# Patient Record
Sex: Female | Born: 1935 | Hispanic: Yes | Smoking: Never smoker
Health system: Southern US, Community
[De-identification: ages and names within clinical notes are randomized; demographics above are authoritative.]

## PROBLEM LIST (undated history)

## (undated) DIAGNOSIS — M069 Rheumatoid arthritis, unspecified: Secondary | ICD-10-CM

## (undated) DIAGNOSIS — IMO0001 Reserved for inherently not codable concepts without codable children: Secondary | ICD-10-CM

## (undated) DIAGNOSIS — Z531 Procedure and treatment not carried out because of patient's decision for reasons of belief and group pressure: Secondary | ICD-10-CM

## (undated) DIAGNOSIS — E7800 Pure hypercholesterolemia, unspecified: Secondary | ICD-10-CM

## (undated) DIAGNOSIS — J189 Pneumonia, unspecified organism: Secondary | ICD-10-CM

## (undated) DIAGNOSIS — E079 Disorder of thyroid, unspecified: Secondary | ICD-10-CM

## (undated) DIAGNOSIS — I10 Essential (primary) hypertension: Secondary | ICD-10-CM

## (undated) DIAGNOSIS — E039 Hypothyroidism, unspecified: Secondary | ICD-10-CM

## (undated) HISTORY — PX: CATARACT EXTRACTION W/ INTRAOCULAR LENS IMPLANT: SHX1309

## (undated) HISTORY — PX: DILATION AND CURETTAGE OF UTERUS: SHX78

## (undated) HISTORY — PX: TUBAL LIGATION: SHX77

---

## 2016-05-31 ENCOUNTER — Emergency Department (HOSPITAL_COMMUNITY): Payer: Medicare (Managed Care)

## 2016-05-31 ENCOUNTER — Encounter (HOSPITAL_COMMUNITY): Payer: Self-pay

## 2016-05-31 ENCOUNTER — Ambulatory Visit (HOSPITAL_COMMUNITY): Payer: Medicare (Managed Care)

## 2016-05-31 ENCOUNTER — Other Ambulatory Visit (HOSPITAL_COMMUNITY): Payer: Medicare (Managed Care)

## 2016-05-31 ENCOUNTER — Inpatient Hospital Stay (HOSPITAL_COMMUNITY)
Admission: EM | Admit: 2016-05-31 | Discharge: 2016-06-04 | DRG: 287 | Disposition: A | Discharge status: Home / Self Care | Payer: Medicare (Managed Care) | Attending: Internal Medicine | Admitting: Internal Medicine

## 2016-05-31 DIAGNOSIS — M069 Rheumatoid arthritis, unspecified: Secondary | ICD-10-CM | POA: Diagnosis present

## 2016-05-31 DIAGNOSIS — I272 Other secondary pulmonary hypertension: Secondary | ICD-10-CM | POA: Diagnosis present

## 2016-05-31 DIAGNOSIS — I700 Atherosclerosis of aorta: Secondary | ICD-10-CM | POA: Diagnosis present

## 2016-05-31 DIAGNOSIS — R710 Precipitous drop in hematocrit: Secondary | ICD-10-CM

## 2016-05-31 DIAGNOSIS — I480 Paroxysmal atrial fibrillation: Secondary | ICD-10-CM | POA: Diagnosis not present

## 2016-05-31 DIAGNOSIS — E063 Autoimmune thyroiditis: Secondary | ICD-10-CM | POA: Diagnosis present

## 2016-05-31 DIAGNOSIS — Z79899 Other long term (current) drug therapy: Secondary | ICD-10-CM

## 2016-05-31 DIAGNOSIS — I2584 Coronary atherosclerosis due to calcified coronary lesion: Secondary | ICD-10-CM

## 2016-05-31 DIAGNOSIS — I2510 Atherosclerotic heart disease of native coronary artery without angina pectoris: Secondary | ICD-10-CM | POA: Diagnosis present

## 2016-05-31 DIAGNOSIS — I9589 Other hypotension: Secondary | ICD-10-CM | POA: Diagnosis not present

## 2016-05-31 DIAGNOSIS — Z993 Dependence on wheelchair: Secondary | ICD-10-CM

## 2016-05-31 DIAGNOSIS — I10 Essential (primary) hypertension: Secondary | ICD-10-CM | POA: Diagnosis present

## 2016-05-31 DIAGNOSIS — N141 Nephropathy induced by other drugs, medicaments and biological substances: Secondary | ICD-10-CM | POA: Diagnosis not present

## 2016-05-31 DIAGNOSIS — K219 Gastro-esophageal reflux disease without esophagitis: Secondary | ICD-10-CM | POA: Diagnosis present

## 2016-05-31 DIAGNOSIS — R079 Chest pain, unspecified: Secondary | ICD-10-CM | POA: Diagnosis present

## 2016-05-31 DIAGNOSIS — I712 Thoracic aortic aneurysm, without rupture: Secondary | ICD-10-CM | POA: Diagnosis present

## 2016-05-31 DIAGNOSIS — M810 Age-related osteoporosis without current pathological fracture: Secondary | ICD-10-CM | POA: Diagnosis present

## 2016-05-31 DIAGNOSIS — T508X5A Adverse effect of diagnostic agents, initial encounter: Secondary | ICD-10-CM | POA: Diagnosis not present

## 2016-05-31 DIAGNOSIS — I7121 Aneurysm of the ascending aorta, without rupture: Secondary | ICD-10-CM

## 2016-05-31 DIAGNOSIS — N390 Urinary tract infection, site not specified: Secondary | ICD-10-CM | POA: Diagnosis not present

## 2016-05-31 DIAGNOSIS — I959 Hypotension, unspecified: Secondary | ICD-10-CM | POA: Diagnosis present

## 2016-05-31 DIAGNOSIS — D649 Anemia, unspecified: Secondary | ICD-10-CM | POA: Diagnosis present

## 2016-05-31 HISTORY — DX: Rheumatoid arthritis, unspecified: M06.9

## 2016-05-31 HISTORY — DX: Hypothyroidism, unspecified: E03.9

## 2016-05-31 HISTORY — DX: Pneumonia, unspecified organism: J18.9

## 2016-05-31 HISTORY — DX: Disorder of thyroid, unspecified: E07.9

## 2016-05-31 HISTORY — DX: Essential (primary) hypertension: I10

## 2016-05-31 LAB — CBC
HEMATOCRIT: 41.4 % (ref 36.0–46.0)
Hemoglobin: 13 g/dL (ref 12.0–15.0)
MCH: 26.4 pg (ref 26.0–34.0)
MCHC: 31.4 g/dL (ref 30.0–36.0)
MCV: 84.1 fL (ref 78.0–100.0)
Platelets: 281 10*3/uL (ref 150–400)
RBC: 4.92 MIL/uL (ref 3.87–5.11)
RDW: 15.8 % — AB (ref 11.5–15.5)
WBC: 14 10*3/uL — AB (ref 4.0–10.5)

## 2016-05-31 LAB — BASIC METABOLIC PANEL
Anion gap: 6 (ref 5–15)
BUN: 12 mg/dL (ref 6–20)
CHLORIDE: 105 mmol/L (ref 101–111)
CO2: 26 mmol/L (ref 22–32)
Calcium: 9.1 mg/dL (ref 8.9–10.3)
Creatinine, Ser: 0.64 mg/dL (ref 0.44–1.00)
GFR calc Af Amer: >60 mL/min (ref >60)
GFR calc non Af Amer: >60 mL/min (ref >60)
Glucose, Bld: 110 mg/dL — ABNORMAL HIGH (ref 65–99)
POTASSIUM: 3.8 mmol/L (ref 3.5–5.1)
SODIUM: 137 mmol/L (ref 135–145)

## 2016-05-31 LAB — D-DIMER, QUANTITATIVE (NOT AT ARMC): D DIMER QUANT: 1.31 ug{FEU}/mL — AB (ref 0.00–0.50)

## 2016-05-31 LAB — TROPONIN I
TROPONIN I: 0.04 ng/mL — AB (ref <0.03)
Troponin I: 0.03 ng/mL (ref <0.03)

## 2016-05-31 LAB — I-STAT TROPONIN, ED: Troponin i, poc: 0.05 ng/mL (ref 0.00–0.08)

## 2016-05-31 MED ORDER — ONDANSETRON HCL 4 MG/2ML IJ SOLN
4.0000 mg | Freq: Once | INTRAMUSCULAR | Status: AC
  Administered 2016-05-31: 4 mg via INTRAVENOUS
  Filled 2016-05-31: qty 2

## 2016-05-31 MED ORDER — SODIUM CHLORIDE 0.9% FLUSH
3.0000 mL | Freq: Two times a day (BID) | INTRAVENOUS | Status: DC
  Administered 2016-06-01 – 2016-06-03 (×4): 3 mL via INTRAVENOUS

## 2016-05-31 MED ORDER — KETOROLAC TROMETHAMINE 30 MG/ML IJ SOLN
15.0000 mg | Freq: Three times a day (TID) | INTRAMUSCULAR | Status: AC | PRN
  Administered 2016-05-31 – 2016-06-01 (×2): 15 mg via INTRAVENOUS
  Filled 2016-05-31 (×2): qty 1

## 2016-05-31 MED ORDER — HEPARIN (PORCINE) IN NACL 100-0.45 UNIT/ML-% IJ SOLN
1300.0000 [IU]/h | INTRAMUSCULAR | Status: DC
  Administered 2016-05-31: 700 [IU]/h via INTRAVENOUS
  Filled 2016-05-31 (×2): qty 250

## 2016-05-31 MED ORDER — ETODOLAC 200 MG PO CAPS
200.0000 mg | ORAL_CAPSULE | Freq: Three times a day (TID) | ORAL | Status: DC
  Administered 2016-05-31 – 2016-06-04 (×9): 200 mg via ORAL
  Filled 2016-05-31 (×15): qty 1

## 2016-05-31 MED ORDER — SODIUM CHLORIDE 0.9 % IV BOLUS (SEPSIS)
250.0000 mL | Freq: Once | INTRAVENOUS | Status: AC
  Administered 2016-05-31: 250 mL via INTRAVENOUS

## 2016-05-31 MED ORDER — TRAMADOL HCL 50 MG PO TABS
50.0000 mg | ORAL_TABLET | Freq: Three times a day (TID) | ORAL | Status: DC
  Administered 2016-05-31 – 2016-06-04 (×12): 50 mg via ORAL
  Filled 2016-05-31 (×12): qty 1

## 2016-05-31 MED ORDER — LEVOTHYROXINE SODIUM 112 MCG PO TABS
112.0000 ug | ORAL_TABLET | Freq: Every day | ORAL | Status: DC
  Administered 2016-06-01: 112 ug via ORAL
  Filled 2016-05-31: qty 1

## 2016-05-31 MED ORDER — CLONAZEPAM 0.5 MG PO TABS
0.5000 mg | ORAL_TABLET | Freq: Two times a day (BID) | ORAL | Status: DC | PRN
  Administered 2016-05-31: 0.5 mg via ORAL
  Filled 2016-05-31: qty 1

## 2016-05-31 MED ORDER — GI COCKTAIL ~~LOC~~
30.0000 mL | Freq: Once | ORAL | Status: AC
  Administered 2016-05-31: 30 mL via ORAL
  Filled 2016-05-31: qty 30

## 2016-05-31 MED ORDER — FAMOTIDINE 20 MG PO TABS
20.0000 mg | ORAL_TABLET | Freq: Every day | ORAL | Status: DC
Start: 2016-05-31 — End: 2016-06-04
  Administered 2016-05-31 – 2016-06-04 (×5): 20 mg via ORAL
  Filled 2016-05-31 (×5): qty 1

## 2016-05-31 MED ORDER — HEPARIN BOLUS VIA INFUSION
3100.0000 [IU] | Freq: Once | INTRAVENOUS | Status: AC
  Administered 2016-05-31: 3100 [IU] via INTRAVENOUS
  Filled 2016-05-31: qty 3100

## 2016-05-31 MED ORDER — IOPAMIDOL (ISOVUE-370) INJECTION 76%
80.0000 mL | Freq: Once | INTRAVENOUS | Status: AC | PRN
  Administered 2016-05-31: 80 mL via INTRAVENOUS

## 2016-05-31 MED ORDER — CALCIUM CARBONATE 1250 (500 CA) MG PO TABS
1.0000 | ORAL_TABLET | Freq: Every day | ORAL | Status: DC
  Administered 2016-06-03 – 2016-06-04 (×2): 500 mg via ORAL
  Filled 2016-05-31 (×3): qty 1

## 2016-05-31 MED ORDER — MORPHINE SULFATE (PF) 4 MG/ML IV SOLN
4.0000 mg | Freq: Once | INTRAVENOUS | Status: AC
Start: 2016-05-31 — End: 2016-05-31
  Administered 2016-05-31: 2 mg via INTRAVENOUS
  Filled 2016-05-31: qty 1

## 2016-05-31 MED ORDER — ENSURE ENLIVE PO LIQD
237.0000 mL | Freq: Two times a day (BID) | ORAL | Status: DC
  Administered 2016-06-01 – 2016-06-04 (×4): 237 mL via ORAL

## 2016-05-31 MED ORDER — NITROGLYCERIN 0.4 MG SL SUBL
0.4000 mg | SUBLINGUAL_TABLET | SUBLINGUAL | Status: DC | PRN
  Administered 2016-05-31 (×4): 0.4 mg via SUBLINGUAL
  Filled 2016-05-31 (×3): qty 1

## 2016-05-31 MED ORDER — ETODOLAC ER 600 MG PO TB24: 300.0000 mg | ORAL_TABLET | Freq: Three times a day (TID) | ORAL | Status: DC

## 2016-05-31 MED ORDER — ASPIRIN 81 MG PO CHEW
324.0000 mg | CHEWABLE_TABLET | Freq: Once | ORAL | Status: AC
  Administered 2016-05-31: 324 mg via ORAL
  Filled 2016-05-31: qty 4

## 2016-05-31 MED ORDER — VITAMIN D 1000 UNITS PO TABS
1000.0000 [IU] | ORAL_TABLET | Freq: Every day | ORAL | Status: DC
  Administered 2016-06-01 – 2016-06-04 (×4): 1000 [IU] via ORAL
  Filled 2016-05-31 (×4): qty 1

## 2016-05-31 NOTE — ED Provider Notes (Signed)
CSN: [id number]     Arrival date & time 05/31/16  2595 History   First MD Initiated Contact with Patient 05/31/16 0945     Chief Complaint  Patient presents with  . Chest Pain  . Shortness of Breath     (Consider location/radiation/quality/duration/timing/severity/associated sxs/prior Treatment) HPI Comments: Patient with a history of HTN, Hypothyroidism, and RA presents today with a chief complaint of chest pain.  She states that she has had pain across her chest since last evening.  Pain radiates to her back.  She reports that the pain is worse with inspiration.  She has taken Tramadol 50 mg for the pain without improvement.  She was not doing anything exertional at the onset of pain.  Pain associated with SOB.  She denies cough, hemoptysis, abdominal pain, nausea, vomiting, numbness, tingling, or LE edema.  Denies prior cardiac history.  She denies any history of DM, Hyperlipidemia, or smoking.  Patient denies history of DVT or PE.  She traveled here from [place] via plane on 05-03-16.  No surgeries in the past 4 weeks.  She is wheelchair bound due to the severity of the RA.  The history is provided by the patient.    Past Medical History  Diagnosis Date  . Hypertension   . Arthritis   . Thyroid disease    History reviewed. No pertinent past surgical history. No family history on file. Social History  Substance Use Topics  . Smoking status: Never Smoker   . Smokeless tobacco: None  . Alcohol Use: None   OB History    No data available     Review of Systems  All other systems reviewed and are negative.     Allergies  Review of patient's allergies indicates no known allergies.  Home Medications   Prior to Admission medications   Not on File   BP 120/82 mmHg  Pulse 96  Temp(Src) 98.3 F (36.8 C) (Oral)  Resp 27  Ht 5\' 3"  (1.6 m)  Wt 52.164 kg  BMI 20.38 kg/m2  SpO2 93% Physical Exam  Constitutional: She appears well-developed and well-nourished.  HENT:   Head: Normocephalic and atraumatic.  Neck: Normal range of motion. Neck supple.  Cardiovascular: Normal rate, regular rhythm and normal heart sounds.   Pulmonary/Chest: Effort normal and breath sounds normal. Tachypnea noted. No respiratory distress. She has no wheezes. She has no rales. She exhibits tenderness.  Abdominal: Soft. She exhibits no mass. There is no tenderness.  Musculoskeletal: Normal range of motion.  No LE edema or erythema  Neurological: She is alert.  Skin: Skin is warm and dry.  Psychiatric: She has a normal mood and affect.  Nursing note and vitals reviewed.   ED Course  Procedures (including critical care time) Labs Review Labs Reviewed  BASIC METABOLIC PANEL  CBC  D-DIMER, QUANTITATIVE (NOT AT [facility])  OTTO [facility], ED    Imaging Review Dg Chest 2 View  05/31/2016  CLINICAL DATA:  Severe shortness of breath and chest pain since yesterday. EXAM: CHEST  2 VIEW COMPARISON:  None. FINDINGS: Linear densities at left lung base could represent atelectasis or scarring. The thoracic aorta appears to be large for size and has atherosclerotic calcifications. Distal descending thoracic aorta appears to measure at least 4 cm. The ascending thoracic aorta also appears prominent on the frontal view. Sclerosis and degenerative changes involving the left glenohumeral joint. No large pleural effusions. Evidence for a compression deformity along the superior endplate of T10. Age of this fracture  is unknown. IMPRESSION: Enlargement of the thoracic aorta and concern for a thoracic aortic aneurysm. This could better characterized with CT examination. Aortic atherosclerosis. Linear densities at the left lung base may represent scarring or atelectasis, particularly in the lingula. This could also be better characterized with CT. T10 compression fracture.  Fracture is age indeterminate. Electronically Signed   By: Richarda Overlie M.D.   On: 05/31/2016 10:41   Ct Angio Chest Pe W Or Wo  Contrast  05/31/2016  CLINICAL DATA:  Chest pain and shortness of breath for 1 day EXAM: CT ANGIOGRAPHY CHEST WITH CONTRAST TECHNIQUE: Multidetector CT imaging of the chest was performed using the standard protocol during bolus administration of intravenous contrast. Multiplanar CT image reconstructions and MIPs were obtained to evaluate the vascular anatomy. CONTRAST:  80 mL Isovue 370 nonionic COMPARISON:  Chest radiograph May 31, 2016 FINDINGS: Cardiovascular: There is no demonstrable pulmonary embolus. Ascending thoracic aorta has a maximum transverse diameter of 4.6 x 4.6 cm. The entire thoracic aorta appears somewhat ectatic. Contrast bolus is not timed to optimize aortic visualization. No dissection is seen in the thoracic aorta to the extent that assessment for potential dissection can be made based on the contrast bolus. There is mild calcification in the proximal left common carotid and left subclavian arteries. Visualized great vessels otherwise appear unremarkable. There is no appreciable pericardial thickening. There is slight generalized cardiac enlargement. There are scattered foci of coronary artery calcification. Mediastinum/Nodes: Thyroid appears unremarkable. There is no appreciable thoracic adenopathy. Lungs/Pleura: There is patchy atelectatic change in both lung bases as well as in the posterior segments of each upper lobe. There is peribronchial thickening in the lateral segment of the right middle lobe. There is also central peribronchial thickening in both lower lobes proximally. Upper Abdomen: In the visualized upper abdomen, there is atherosclerotic calcification in the upper abdominal aorta. Visualized upper abdominal structures otherwise are unremarkable. Musculoskeletal: There is anterior wedging of the T11 vertebral body. There are no blastic or lytic bone lesions. Review of the MIP images confirms the above findings. IMPRESSION: No demonstrable pulmonary embolus. Ascending thoracic  aorta has a maximum transverse diameter 4.6 x 4.6 cm. Ascending thoracic aortic aneurysm. Recommend semi-annual imaging followup by CTA or MRA and referral to cardiothoracic surgery if not already obtained. This recommendation follows 2010 ACCF/AHA/AATS/ACR/ASA/SCA/SCAI/SIR/STS/SVM Guidelines for the Diagnosis and Management of Patients With Thoracic Aortic Disease. Circulation. 2010; 121: Z993-T701. There are atherosclerotic foci of calcification in the aorta. There are scattered foci of coronary artery calcification. Pericardium is not appreciably thickened. Areas of bronchitis and atelectasis.  No frank consolidation. No apparent adenopathy. Electronically Signed   By: Bretta Bang III M.D.   On: 05/31/2016 11:44   Nm Myocar Multi W/spect W/wall Motion / Ef  06/01/2016  CLINICAL DATA:  82 year old female with chest pain and hypertension band. Note ST changes on EKG. Minimal T-wave inversion. EXAM: MYOCARDIAL IMAGING WITH SPECT (REST AND PHARMACOLOGIC-STRESS) GATED LEFT VENTRICULAR WALL MOTION STUDY LEFT VENTRICULAR EJECTION FRACTION TECHNIQUE: Standard myocardial SPECT imaging was performed after resting intravenous injection of 10 mCi Tc-36m tetrofosmin. Subsequently, intravenous infusion of Lexiscan was performed under the supervision of the Cardiology staff. At peak effect of the drug, 30 mCi Tc-62m tetrofosmin was injected intravenously and standard myocardial SPECT imaging was performed. Quantitative gated imaging was also performed to evaluate left ventricular wall motion, and estimate left ventricular ejection fraction. COMPARISON:  None. FINDINGS: Perfusion: There is a small to moderate size region of mild decrease counts in the  mid and basilar segment of lateral wall which improves from stress to rest. Wall Motion: Normal left ventricular wall motion. No left ventricular dilation. Left Ventricular Ejection Fraction: 71 % End diastolic volume 47 ml End systolic volume 14 ml IMPRESSION: 1. Small is  moderate region of reversible perfusion in the mid and basilar segment of lateral wall. 2. Normal left ventricular wall motion. 3. Left ventricular ejection fraction 71% 4. Non invasive risk stratification*: Intermediate *2012 Appropriate Use Criteria for Coronary Revascularization Focused Update: J Am Coll Cardiol. 2012;59(9):857-881. http://content.dementiazones.com.aspx?articleid=[email or web address] These results will be called to the ordering clinician or representative by the Radiologist Assistant, and communication documented in the PACS or zVision Dashboard. Electronically Signed   By: Genevive Bi M.D.   On: 06/01/2016 14:04   I have personally reviewed and evaluated these images and lab results as part of my medical decision-making.   EKG Interpretation   Date/Time:  Wednesday May 31 2016 09:44:32 EDT Ventricular Rate:  109 PR Interval:  148 QRS Duration: 66 QT Interval:  316 QTC Calculation: 425 R Axis:   29 Text Interpretation:  Sinus tachycardia Septal infarct , age undetermined  Abnormal ECG No previous ECGs available Confirmed by Manus Gunning  MD, STEPHEN  [phone]) on 05/31/2016 9:56:24 AM Also confirmed by Manus Gunning  MD, STEPHEN  [phone]), editor BREWER, CCT, GLENN ([id number])  on 05/31/2016 10:01:51 AM      12:15 PM Discussed the thoracic aortic aneurysm with CT surgery.  He states that this would need to be monitored, but would not need surgery at this time.  He does not feel that any additional imaging is indicated at this time if the patient appears stable.    1:44 PM Discussed with Trish with Cardiology.  She states that they will come see the patient.   MDM   Final diagnoses:  None   Patient with a history of HTN and RA presents today with chest pain that has been constant since last evening.  Pain worse with inspiration.  She is wheelchair bound and flew from [place] on 05-03-16.  Therefore, she does have risk factors for PE.  She did have a widened mediastinum on CXR.  However,  clinically her presentation is most consistent with PE.  Blood pressure is stable.  Patient appears to be only mildly uncomfortable on exam.  Therefore, doubt dissection.  Concern for PE.  Therefore, CT angio chest ordered to rule out PE.  CT angio chest negative for PE.  EKG unremarkable.  Initial troponin is slightly positive at 0.04.  Cardiology consulted and recommended admission to medicine and will perform nuclear stress test in the morning.  Patient admitted to Internal Medicine teaching service.     Santiago Glad, PA-C 06/01/16 2211  Glynn Octave, MD 06/06/16 [phone]

## 2016-05-31 NOTE — ED Notes (Signed)
Patient here with SSCP radiating to back and neck since last pm, describes pain as sharp. Pain with inspiration. Family states that she has had lots of reflux past few days.

## 2016-05-31 NOTE — Consult Note (Signed)
CARDIOLOGY CONSULT NOTE   Patient ID: Whitney Mayer MRN: [id number] DOB/AGE: 80-May-1933 80 y.o.  Admit date: 05/31/2016  Primary Physician   No primary care provider on file. Primary Cardiologist: New Requesting MD: Dr. Jaci Lazier Reason for Consultation: Chest pain  HPI: Whitney Mayer is a 81 year old female with a past medical history of HTN, RA and hypothyroidism. No prior history of CAD.   She is visiting her daughters, patient lives full time in [place] She was eating breakfast yesterday morning, and after she finished her breakfast she developed substernal chest pain that she describes as sharp pressure with radiation to her back. She denies shortness of breath but feels like that her breathing is more shallow. Denies associated diaphoresis, nausea, and vomiting. Her symptoms continued until around lunch time at which time she says that she just "didn't feel well". She also says that she felt like that she had some indigestion. Her pain at that time began to radiate to her throat area. She continued to have chest pain all day yesterday and all night last night. She decided to come to the emergency department as her pain was not relieved. She was given morphine IV in the emergency department with no relief of her pain.  She suffers from rheumatoid arthritis, she no longer can walk, she gets around in a wheelchair. It has been 7 months since she last walked. She follows with a primary care provider in [place] and reports that she takes losartan for high blood pressure. She takes her medication with high compliance. Denies a family history of coronary artery disease. Her father died when she was only 17 years old, of unknown cause. Her mother lived to be 58 years old, she did not have a history of CAD but did have a stroke near the end of her life.   Her EKG shows sinus tachycardia. Her troponin is 0.04. Her d-dimer was elevated, CT angiogram ruled out pulmonary embolus. There was  scattered foci of coronary artery calcification on CT, she also had a 4.6 cm ascending thoracic aortic aneurysm.    Past Medical History  Diagnosis Date  . Hypertension   . Arthritis   . Thyroid disease      History reviewed. No pertinent past surgical history.  No Known Allergies  I have reviewed the patient's current medications . gi cocktail  30 mL Oral Once       Prior to Admission medications   Medication Sig Start Date End Date Taking? Authorizing Provider  aspirin 325 MG tablet Take 325 mg by mouth daily as needed for mild pain.   Yes Historical Provider, MD  clonazePAM (KLONOPIN) 0.5 MG tablet Take 0.5 mg by mouth 2 (two) times daily as needed for anxiety.   Yes Historical Provider, MD  etodolac (LODINE XL) 600 MG 24 hr tablet Take 300 mg by mouth 3 (three) times daily.   Yes Historical Provider, MD  ibuprofen (ADVIL,MOTRIN) 200 MG tablet Take 400 mg by mouth every 6 (six) hours as needed for moderate pain.   Yes Historical Provider, MD  levothyroxine (SYNTHROID, LEVOTHROID) 112 MCG tablet Take 112 mcg by mouth daily.   Yes Historical Provider, MD  losartan (COZAAR) 100 MG tablet Take 100 mg by mouth daily.   Yes Historical Provider, MD  traMADol (ULTRAM) 50 MG tablet Take 50 mg by mouth 3 (three) times daily.   Yes Historical Provider, MD     Social History   Social History  .  Marital Status: Divorced    Spouse Name: N/A  . Number of Children: N/A  . Years of Education: N/A   Occupational History  . Not on file.   Social History Main Topics  . Smoking status: Never Smoker   . Smokeless tobacco: Not on file  . Alcohol Use: Not on file  . Drug Use: Not on file  . Sexual Activity: Not on file   Other Topics Concern  . Not on file   Social History Narrative    No family status information on file.   No family history on file.   ROS:  Full 14 point review of systems complete and found to be negative unless listed above.  Physical Exam: Blood pressure  102/69, pulse 81, temperature 98.3 F (36.8 C), temperature source Oral, resp. rate 20, height 5\' 3"  (1.6 m), weight 115 lb (52.164 kg), SpO2 97 %.  General: Well developed, well nourished, female in no acute distress Head: Eyes PERRLA, No xanthomas.   Normocephalic and atraumatic, oropharynx without edema or exudate.  Lungs: CTA Heart: HRRR S1 S2, no rub/gallop, no murmur. pulses are 2+ extrem.   Neck: No carotid bruits. No lymphadenopathy.  No JVD. Abdomen: Bowel sounds present, abdomen soft and non-tender without masses or hernias noted. Msk:  No spine or cva tenderness. No weakness Extremities: No clubbing or cyanosis.  No edema.  Neuro: Alert and oriented X 3. No focal deficits noted. Psych:  Good affect, responds appropriately Skin: No rashes or lesions noted.  Labs:   Lab Results  Component Value Date   WBC 14.0* 05/31/2016   HGB 13.0 05/31/2016   HCT 41.4 05/31/2016   MCV 84.1 05/31/2016   PLT 281 05/31/2016     Recent Labs Lab 05/31/16 1010  NA 137  K 3.8  CL 105  CO2 26  BUN 12  CREATININE 0.64  CALCIUM 9.1  GLUCOSE 110*   Recent Labs  05/31/16 1241  TROPONINI 0.04*    Recent Labs  05/31/16 1035  TROPIPOC 0.05   No results found for: BNP No results found for: CHOL, HDL, LDLCALC, TRIG Lab Results  Component Value Date   DDIMER 1.31* 05/31/2016   Echo: pending  ECG: Sinus tach  Radiology:  Dg Chest 2 View  05/31/2016  CLINICAL DATA:  Severe shortness of breath and chest pain since yesterday. EXAM: CHEST  2 VIEW COMPARISON:  None. FINDINGS: Linear densities at left lung base could represent atelectasis or scarring. The thoracic aorta appears to be large for size and has atherosclerotic calcifications. Distal descending thoracic aorta appears to measure at least 4 cm. The ascending thoracic aorta also appears prominent on the frontal view. Sclerosis and degenerative changes involving the left glenohumeral joint. No large pleural effusions. Evidence for  a compression deformity along the superior endplate of T10. Age of this fracture is unknown. IMPRESSION: Enlargement of the thoracic aorta and concern for a thoracic aortic aneurysm. This could better characterized with CT examination. Aortic atherosclerosis. Linear densities at the left lung base may represent scarring or atelectasis, particularly in the lingula. This could also be better characterized with CT. T10 compression fracture.  Fracture is age indeterminate. Electronically Signed   By: 08/01/2016 M.D.   On: 05/31/2016 10:41   Ct Angio Chest Pe W Or Wo Contrast  05/31/2016  CLINICAL DATA:  Chest pain and shortness of breath for 1 day EXAM: CT ANGIOGRAPHY CHEST WITH CONTRAST TECHNIQUE: Multidetector CT imaging of the chest was performed using the  standard protocol during bolus administration of intravenous contrast. Multiplanar CT image reconstructions and MIPs were obtained to evaluate the vascular anatomy. CONTRAST:  80 mL Isovue 370 nonionic COMPARISON:  Chest radiograph May 31, 2016 FINDINGS: Cardiovascular: There is no demonstrable pulmonary embolus. Ascending thoracic aorta has a maximum transverse diameter of 4.6 x 4.6 cm. The entire thoracic aorta appears somewhat ectatic. Contrast bolus is not timed to optimize aortic visualization. No dissection is seen in the thoracic aorta to the extent that assessment for potential dissection can be made based on the contrast bolus. There is mild calcification in the proximal left common carotid and left subclavian arteries. Visualized great vessels otherwise appear unremarkable. There is no appreciable pericardial thickening. There is slight generalized cardiac enlargement. There are scattered foci of coronary artery calcification. Mediastinum/Nodes: Thyroid appears unremarkable. There is no appreciable thoracic adenopathy. Lungs/Pleura: There is patchy atelectatic change in both lung bases as well as in the posterior segments of each upper lobe. There is  peribronchial thickening in the lateral segment of the right middle lobe. There is also central peribronchial thickening in both lower lobes proximally. Upper Abdomen: In the visualized upper abdomen, there is atherosclerotic calcification in the upper abdominal aorta. Visualized upper abdominal structures otherwise are unremarkable. Musculoskeletal: There is anterior wedging of the T11 vertebral body. There are no blastic or lytic bone lesions. Review of the MIP images confirms the above findings. IMPRESSION: No demonstrable pulmonary embolus. Ascending thoracic aorta has a maximum transverse diameter 4.6 x 4.6 cm. Ascending thoracic aortic aneurysm. Recommend semi-annual imaging followup by CTA or MRA and referral to cardiothoracic surgery if not already obtained. This recommendation follows 2010 ACCF/AHA/AATS/ACR/ASA/SCA/SCAI/SIR/STS/SVM Guidelines for the Diagnosis and Management of Patients With Thoracic Aortic Disease. Circulation. 2010; 121: P950-D326. There are atherosclerotic foci of calcification in the aorta. There are scattered foci of coronary artery calcification. Pericardium is not appreciably thickened. Areas of bronchitis and atelectasis.  No frank consolidation. No apparent adenopathy. Electronically Signed   By: Bretta Bang III M.D.   On: 05/31/2016 11:44    ASSESSMENT AND PLAN:    Active Problems:   * No active hospital problems. *  1. Chest pain: Patient presents with substernal chest pain with occasional radiation to back and throat. No associated diaphoresis or nausea. Pain is not reproducible with palpation or worsened with movement. She describes that she is breathing more shallow but does not endorse shortness of breath. Her troponin is 0.04. It would be reasonable to do Lexiscan Myoview in the morning to rule out ischemia. Also will try sublingual nitroglycerin for her pain and see if this alleviates her pain. Her pain was not alleviated with IV morphine. We will cycle enzymes  and follow, will start on heparin gtt. Will order Echo.  Will obtain hemoglobin A1c and lipid panel for risk stratification.  2. HTN: On losartan. BP is stable. Renal function within normal limits.  3. Rheumatoid arthritis: Patient manages her pain with aspirin and tramadol. She is not on any DMARD therapy. We will continue her tramadol for pain.    Signed: Little Ishikawa, NP 05/31/2016 2:22 PM Pager [phone]  Co-Sign MD

## 2016-05-31 NOTE — Progress Notes (Signed)
Patient arrived to 2W from ED.  Telemetry monitor was applied and CCMD notified.  Patient was oriented to room including call light and phone.  Will continue to monitor.

## 2016-05-31 NOTE — Progress Notes (Signed)
ANTICOAGULATION CONSULT NOTE - Initial Consult  Pharmacy Consult for heparin Indication: chest pain/ACS  No Known Allergies  Patient Measurements: Height: 5\' 3"  (160 cm) Weight: 115 lb (52.164 kg) IBW/kg (Calculated) : 52.4 Heparin Dosing Weight: 52.2kg  Vital Signs: Temp: 98.3 F (36.8 C) (07/05 0945) Temp Source: Oral (07/05 0945) BP: 100/69 mmHg (07/05 1500) Pulse Rate: 79 (07/05 1500)  Labs:  Recent Labs  05/31/16 1010 05/31/16 1241  HGB 13.0  --   HCT 41.4  --   PLT 281  --   CREATININE 0.64  --   TROPONINI  --  0.04*    Estimated Creatinine Clearance: 45.4 mL/min (by C-G formula based on Cr of 0.64).   Medical History: Past Medical History  Diagnosis Date  . Hypertension   . Rheumatoid arthritis (HCC)   . Thyroid disease     Medications:  Infusions:  . heparin      Assessment: 16 yof presented to the ED with CP. Baseline CBC is WNL. She is not on anticoagulation PTA. Initial troponin is mildly elevated. To start IV heparin.   Goal of Therapy:  Heparin level 0.3-0.7 units/ml Monitor platelets by anticoagulation protocol: Yes   Plan:  - Heparin bolus 3100 units IV x 1 - Heparin gtt 700 units/hr - Check an 8 hr HL - Daily HL and CBC  Ileigh Mettler, 89 05/31/2016,3:18 PM

## 2016-05-31 NOTE — H&P (Signed)
Date: 05/31/2016               Patient Name:  Whitney Mayer MRN: [id number]  DOB: 06/11/1932 Age / Sex: 82 y.o., female   PCP: No primary care provider on file.         Medical Service: Internal Medicine Teaching Service         Attending Physician: Dr. Doneen Poisson, MD    First Contact: Joaquim Nam, MS4 Pager: [phone]  Second Contact: Dr. Beckie Salts Pager: [phone]       After Hours (After 5p/  First Contact Pager: [phone]  weekends / holidays): Second Contact Pager: [phone]   Chief Complaint: Chest pain   History of Present Illness: Whitney Mayer is a 82yo woman with PMHx of HTN, Hasimoto's thyroiditis, and rheumatoid arthritis presenting with chest pain. She reports the pain started yesterday after she had eaten lunch. She describes the pain as 4/10 in severity, sharp, substernal in location with radiation to her back and neck, and associated with SOB. She notes she had a lot of burping after lunch and that actually made the pain better. Denies any history of reflux symptoms. She reports taking ASA 325 mg, ibuprofen 400 mg, and tramadol (which she takes for her RA) and this alleviated the pain. She did take a plane ride from [place] on June 7th, but denies any leg swelling or pain. She is immobile due to her severe RA and is mostly in bed or a wheelchair. She denies any nausea or diaphoresis.   Meds: Current Facility-Administered Medications  Medication Dose Route Frequency Provider Last Rate Last Dose  . clonazePAM (KLONOPIN) tablet 0.5 mg  0.5 mg Oral BID PRN Su Hoff, MD      . etodolac (LODINE XL) 24 hr tablet 600 mg  600 mg Oral TID Su Hoff, MD      . heparin ADULT infusion 100 units/mL (25000 units/276mL sodium chloride 0.45%)  700 Units/hr Intravenous Continuous Faye Ramsay Rumbarger, RPH 7 mL/hr at 05/31/16 1538 700 Units/hr at 05/31/16 1538  . [START ON 06/01/2016] levothyroxine (SYNTHROID, LEVOTHROID) tablet 112 mcg  112 mcg Oral QAC breakfast Carly J Rivet, MD       . nitroGLYCERIN (NITROSTAT) SL tablet 0.4 mg  0.4 mg Sublingual Q5 min PRN Little Ishikawa, NP      . sodium chloride flush (NS) 0.9 % injection 3 mL  3 mL Intravenous Q12H Carly J Rivet, MD      . traMADol (ULTRAM) tablet 50 mg  50 mg Oral TID Su Hoff, MD        Allergies: Allergies as of 05/31/2016  . (No Known Allergies)   Past Medical History  Diagnosis Date  . Hypertension   . Rheumatoid arthritis (HCC)   . Thyroid disease     Family History: Father- died from stroke at age 24. Mother died from stroke at age 30.  Social History: Lives in [place] most of the year but family in [place]. Never smoker. No alcohol or illicit drug use.   Review of Systems: A complete ROS was negative except as per HPI.   Physical Exam: Blood pressure 101/71, pulse 80, temperature 99 F (37.2 C), temperature source Oral, resp. rate 20, height 5\' 3"  (1.6 m), weight 115 lb (52.164 kg), SpO2 99 %. General: elderly woman sitting up in bed, NAD, pleasant HEENT: [place]/AT, EOMI, sclera anicteric, mucus membranes moist  CV: RRR, no m/g/r Pulm: CTA bilaterally, breaths non-labored on  2 L oxygen Abd: BS+, soft, non-tender Ext: warm, no peripheral edema. She has significant RA changes in her hands, including swan neck deformities, ulnar deviation, and Boutonniere deformities. Neuro: alert and oriented x 3  EKG: Sinus tachycardia. No ST or T wave changes.   CXR: Mediastinum is widened. Linear densities at left lung base. T10 compression fracture.   Assessment & Plan by Problem:  Chest Pain: Patient presented with a 1 day history of substernal chest pain radiating to her neck and back. CXR concerning for aortic dissection with her widened mediastinum, but CTA chest showed a non-dissecting thoracic AAA. CTA was also negative for PE. Her initial troponin was mildly elevated at 0.03. No ST or T wave changes on EKG to suggest acute ischemia. Cardiology saw her and plan for Myoview in the AM. Her chest  pain could also be musculoskeletal in nature as she had reproducible tenderness on exam. Additionally, she had increased burping and pain after eating which could be related to acid reflux. - Myoview in AM, NPO at midnight  - Heparin gtt - Trend trops  - Repeat EKG in AM - Obtain echocardiogram - Cardiac monitoring - If has worsened chest pain, would have low threshold to obtain repeat CTA to evaluate for an aortic dissection  - Obtain BPs in both arms  - Hemoglobin A1c, lipid panel - ASA - Nitro PRN - Start Pepcid    HTN: BPs stable in the 100s-140s systolic. She is on Losartan at home. - Hold home BP med for now given she is normotensive   Rheumatoid Arthritis: She has severe debilitating disease. Reports she has a rheumatologist in [place] However, she is not on a DMARD and I suspect with her clinical manifestations she has never been on DMARD therapy. Reports being on Etodolac at home (NSAID). Per family she may not be going back to [place] right away and may benefit from seeing a Rheumatologist here. Would try to refer to outpatient Rheum if staying in [place] for awhile.   Osteoporosis: She has a vertebral fracture on CXR. This is consistent with osteoporosis. Will start calcium and vitamin D. She needs bisphosphonate therapy, but will hold off on starting this with her recent acid reflux symptoms and chest pain work up. Would start upon discharge.  Hx Hashimoto's Thyroiditis: Currently on levothyroxine 112 mcg daily.  - Check TSH   Diet: Heart healthy, NPO at midnight for Myoview  DVT PPx: Heparin gtt Dispo: Admit patient to Observation with expected length of stay less than 2 midnights.  Signed: Su Hoff, MD 05/31/2016, 5:07 PM  Pager: [phone]

## 2016-05-31 NOTE — Progress Notes (Signed)
Called to bedside for persistent chest pain.  Patient reported pleuritic chest pain radiating to back and neck.  She stated that the chest pain was 7/10, slightly improved from earlier today, but that she is avoiding taking deep breaths due to pain.  Denies dyspnea.  She was in no distress, hemodynamically stable, RRR with no MRG, no TTP along sternal borders, and crackles at the L lung base.  EKG showed TWI in V3-5, possibly evolving from prior earlier in the day.  Troponins x2 low detectable with no evident trend.  Ordered 250cc bolus NS for hypotension and instructed to ask for PRN tramadol if pain continues.  Will continue to trend troponins, f/u on echo results, and repeat EKG if clinical picture changes.

## 2016-05-31 NOTE — ED Notes (Signed)
Pt rcvd additional 2 mg Morphine d/t no change in pain rating

## 2016-05-31 NOTE — H&P (Signed)
Date: 05/31/2016               Patient Name:  Whitney Mayer MRN: [id number]  DOB: 1932-04-09 Age / Sex: 82 y.o., female   PCP: No primary care provider on file.              Medical Service: Internal Medicine Teaching Service              Attending Physician: Dr. Doneen Poisson, MD    First Contact: Joaquim Nam, MS4 Pager: [phone]  Second Contact: Dr. Beckie Salts Pager: [phone]            After Hours (After 5p/  First Contact Pager: [phone]  weekends / holidays): Second Contact Pager: [phone]   Chief Complaint: "Chest pain when I breathe since yesterday"  History of Present Illness:  Whitney Mayer is an 82 year old female with a PMHx of severe Rheumatoid Arthritis who's wheelchair bound and presents with sharp substernal chest pain that started the day before.  Patient is accompanied by daughter and friend. Patient had lunch at her friends house the day before, and complained of feeling uncomfortable after the meal and started burping, which daughter says is very unlike her.  She said she began feeling a sharp 4/10 substernal chest pain at this time and had difficulty breathing.  The pain radiated to her neck and back, and worsened to a 7/10 later that day.  She took 325mg  of Aspirin and 400mg  of ibuprofen at this time which helped. She continued taking shallow breaths to minimize the pain. Her daughter and friend waited until today to bring her to the hospital because they didn't realize the extent of her chest pain.  She says her pain now is a 8 or 9 out of 10 and continues to have a sharp quality and radiates to her back.  She took a plane ride from to the on June 7th, so about a month ago.  She is relatively immobile in her wheelchair but attempts to do daily stretches.  She has never experienced any pain like this before. She denies diaphoresis, nausea, or vomiting.   Her family history is significant for her father dying of a stroke at 63 and her mother died of a stroke at  57.  Meds: Current Facility-Administered Medications  Medication Dose Route Frequency Provider Last Rate Last Dose  . heparin ADULT infusion 100 units/mL (25000 units/247mL sodium chloride 0.45%)  700 Units/hr Intravenous Continuous 100 Rumbarger, RPH 7 mL/hr at 05/31/16 1538 700 Units/hr at 05/31/16 1538  . nitroGLYCERIN (NITROSTAT) SL tablet 0.4 mg  0.4 mg Sublingual Q5 min PRN 08/01/16, NP       Current Outpatient Prescriptions  Medication Sig Dispense Refill  . aspirin 325 MG tablet Take 325 mg by mouth daily as needed for mild pain.    . clonazePAM (KLONOPIN) 0.5 MG tablet Take 0.5 mg by mouth 2 (two) times daily as needed for anxiety.    08/01/16 etodolac (LODINE XL) 600 MG 24 hr tablet Take 300 mg by mouth 3 (three) times daily.    Little Ishikawa ibuprofen (ADVIL,MOTRIN) 200 MG tablet Take 400 mg by mouth every 6 (six) hours as needed for moderate pain.    Marland Kitchen levothyroxine (SYNTHROID, LEVOTHROID) 112 MCG tablet Take 112 mcg by mouth daily.    Marland Kitchen losartan (COZAAR) 100 MG tablet Take 100 mg by mouth daily.    . traMADol (ULTRAM) 50 MG tablet Take 50 mg by mouth 3 (  three) times daily.      Allergies: Allergies as of 05/31/2016  . (No Known Allergies)   Past Medical History  Diagnosis Date  . Hypertension   . Rheumatoid arthritis (HCC)   . Thyroid disease    History reviewed. No pertinent past surgical history. Family History  Problem Relation Age of Onset  . Stroke Mother   . Kidney disease Brother    Social History   Social History  . Marital Status: Divorced    Spouse Name: N/A  . Number of Children: N/A  . Years of Education: N/A   Occupational History  . Not on file.   Social History Main Topics  . Smoking status: Never Smoker   . Smokeless tobacco: Not on file  . Alcohol Use: Not on file  . Drug Use: Not on file  . Sexual Activity: Not on file   Other Topics Concern  . Not on file   Social History Narrative    Review of Systems: See HPI, negative  otherwise  Physical Exam: Blood pressure 105/68, pulse 84, temperature 98.3 F (36.8 C), temperature source Oral, resp. rate 24, height 5\' 3"  (1.6 m), weight 52.164 kg (115 lb), SpO2 97 %. BP 101/71 mmHg  Pulse 80  Temp(Src) 99 F (37.2 C) (Oral)  Resp 20  Ht 5\' 3"  (1.6 m)  Wt 52.164 kg (115 lb)  BMI 20.38 kg/m2  SpO2 99%  LMP  (LMP Unknown)  General Appearance:    Laying in bed in NAD, taking shallow breaths  Head:    Normocephalic, without obvious abnormality, atraumatic  Eyes:    PERRL, conjunctiva clear, cloudy corneas, EOM's intact      Nose:   Nares normal  Throat:   Lips, mucosa, and tongue normal;  Neck:   JVD noted bilaterally  Back:     Symmetric, some kyphosis  Lungs:     Clear to auscultation bilaterally, respirations shallow  Chest Wall:    Tenderness to palpation on chest wall, over sternum   Heart:    Regular rate and rhythm, S1 and S2 normal, no murmur, rub   or gallop      Abdomen:     Soft, non-tender, bowel sounds active all four quadrants,    no masses, no organomegaly        Extremities:   Extremities very thin, atraumatic, no cyanosis or edema  Pulses:   2+ and symmetric all extremities  Skin:   Skin color, texture, turgor normal, no rashes or lesions  Lymph nodes:   Cervical, supraclavicular, and axillary nodes normal  Neurologic:   CNII-XII grossly intact    Lab results:   Imaging results:  Dg Chest 2 View  05/31/2016  CLINICAL DATA:  Severe shortness of breath and chest pain since yesterday. EXAM: CHEST  2 VIEW COMPARISON:  None. FINDINGS: Linear densities at left lung base could represent atelectasis or scarring. The thoracic aorta appears to be large for size and has atherosclerotic calcifications. Distal descending thoracic aorta appears to measure at least 4 cm. The ascending thoracic aorta also appears prominent on the frontal view. Sclerosis and degenerative changes involving the left glenohumeral joint. No large pleural effusions. Evidence  for a compression deformity along the superior endplate of T10. Age of this fracture is unknown. IMPRESSION: Enlargement of the thoracic aorta and concern for a thoracic aortic aneurysm. This could better characterized with CT examination. Aortic atherosclerosis. Linear densities at the left lung base may represent scarring or atelectasis, particularly in  the lingula. This could also be better characterized with CT. T10 compression fracture.  Fracture is age indeterminate. Electronically Signed   By: Richarda Overlie M.D.   On: 05/31/2016 10:41   Ct Angio Chest Pe W Or Wo Contrast  05/31/2016  CLINICAL DATA:  Chest pain and shortness of breath for 1 day EXAM: CT ANGIOGRAPHY CHEST WITH CONTRAST TECHNIQUE: Multidetector CT imaging of the chest was performed using the standard protocol during bolus administration of intravenous contrast. Multiplanar CT image reconstructions and MIPs were obtained to evaluate the vascular anatomy. CONTRAST:  80 mL Isovue 370 nonionic COMPARISON:  Chest radiograph May 31, 2016 FINDINGS: Cardiovascular: There is no demonstrable pulmonary embolus. Ascending thoracic aorta has a maximum transverse diameter of 4.6 x 4.6 cm. The entire thoracic aorta appears somewhat ectatic. Contrast bolus is not timed to optimize aortic visualization. No dissection is seen in the thoracic aorta to the extent that assessment for potential dissection can be made based on the contrast bolus. There is mild calcification in the proximal left common carotid and left subclavian arteries. Visualized great vessels otherwise appear unremarkable. There is no appreciable pericardial thickening. There is slight generalized cardiac enlargement. There are scattered foci of coronary artery calcification. Mediastinum/Nodes: Thyroid appears unremarkable. There is no appreciable thoracic adenopathy. Lungs/Pleura: There is patchy atelectatic change in both lung bases as well as in the posterior segments of each upper lobe. There  is peribronchial thickening in the lateral segment of the right middle lobe. There is also central peribronchial thickening in both lower lobes proximally. Upper Abdomen: In the visualized upper abdomen, there is atherosclerotic calcification in the upper abdominal aorta. Visualized upper abdominal structures otherwise are unremarkable. Musculoskeletal: There is anterior wedging of the T11 vertebral body. There are no blastic or lytic bone lesions. Review of the MIP images confirms the above findings. IMPRESSION: No demonstrable pulmonary embolus. Ascending thoracic aorta has a maximum transverse diameter 4.6 x 4.6 cm. Ascending thoracic aortic aneurysm. Recommend semi-annual imaging followup by CTA or MRA and referral to cardiothoracic surgery if not already obtained. This recommendation follows 2010 ACCF/AHA/AATS/ACR/ASA/SCA/SCAI/SIR/STS/SVM Guidelines for the Diagnosis and Management of Patients With Thoracic Aortic Disease. Circulation. 2010; 121: V612-A449. There are atherosclerotic foci of calcification in the aorta. There are scattered foci of coronary artery calcification. Pericardium is not appreciably thickened. Areas of bronchitis and atelectasis.  No frank consolidation. No apparent adenopathy. Electronically Signed   By: Bretta Bang III M.D.   On: 05/31/2016 11:44    Other results:   Assessment & Plan by Problem:  Whitney Mayer is an 82 year old female with one day of sharp substernal chest pain radiating to her neck and back, likely cardiac in nature.   Active Problems:  1. Chest pain: Whitney Mayer sharp substernal pain is very concerning for a myocardial infarction because of the sharp nature of the pain radiating to her neck and back along with her dyspnea.  Patients severe rheumatoid arthritis raises my suspicion for this diagnosis, as these patients are at higher risk for cardiac problems.  Pulmonary embolism is also high on the differential due to patients pleuritic chest pain that  occurred suddenly and her immobile lifestyle and relatively recent long plane ride. However, her CTA did not show a pulmonary embolus. Aortic dissections may also have sharp pain radiating to the back,but the CTA did not show this and she is not experiencing any HTN or hypotension.  Costochondritis is high on the differential as well due to her chest pain  on palpation and hx of Rheumatoid arthritis.  Lastly, her pain could be caused by esophageal reflux since she was burping extensively after her meal, however the nature and course of her pain make this less likely.  -monitor patient on telemetry -trend troponins -cardiology performing stress test 7/6 -Echo -gtt heparin -A1c -lipid panel  2. HTN:  Patient's BP is stable on losartan.   3. Rheumatoid arthritis: Patient manages her pain with aspirin, tramadol and etodolac. She is not on any DMARD therapy.  Her rheumatologist is in [place] and is managing this problem.  -Continue tramodol for pain   This is a Psychologist, occupational Note.  The care of the patient was discussed with Dr. Josem Kaufmann and the assessment and plan was formulated with their assistance.  Please see their note for official documentation of the patient encounter.   Signed: Jannet Mayer, Med Student 05/31/2016, 3:58 PM

## 2016-06-01 ENCOUNTER — Observation Stay (HOSPITAL_BASED_OUTPATIENT_CLINIC_OR_DEPARTMENT_OTHER): Payer: Medicare (Managed Care)

## 2016-06-01 ENCOUNTER — Observation Stay (HOSPITAL_COMMUNITY): Payer: Medicare (Managed Care)

## 2016-06-01 DIAGNOSIS — R778 Other specified abnormalities of plasma proteins: Secondary | ICD-10-CM | POA: Diagnosis not present

## 2016-06-01 DIAGNOSIS — M8008XA Age-related osteoporosis with current pathological fracture, vertebra(e), initial encounter for fracture: Secondary | ICD-10-CM

## 2016-06-01 DIAGNOSIS — D649 Anemia, unspecified: Secondary | ICD-10-CM

## 2016-06-01 DIAGNOSIS — R079 Chest pain, unspecified: Secondary | ICD-10-CM | POA: Diagnosis not present

## 2016-06-01 DIAGNOSIS — I2510 Atherosclerotic heart disease of native coronary artery without angina pectoris: Secondary | ICD-10-CM | POA: Diagnosis present

## 2016-06-01 DIAGNOSIS — I10 Essential (primary) hypertension: Secondary | ICD-10-CM

## 2016-06-01 DIAGNOSIS — I700 Atherosclerosis of aorta: Secondary | ICD-10-CM | POA: Diagnosis present

## 2016-06-01 DIAGNOSIS — I7121 Aneurysm of the ascending aorta, without rupture: Secondary | ICD-10-CM | POA: Diagnosis present

## 2016-06-01 DIAGNOSIS — I712 Thoracic aortic aneurysm, without rupture: Secondary | ICD-10-CM | POA: Diagnosis not present

## 2016-06-01 DIAGNOSIS — I2584 Coronary atherosclerosis due to calcified coronary lesion: Secondary | ICD-10-CM

## 2016-06-01 DIAGNOSIS — R0789 Other chest pain: Secondary | ICD-10-CM

## 2016-06-01 DIAGNOSIS — M069 Rheumatoid arthritis, unspecified: Secondary | ICD-10-CM | POA: Diagnosis present

## 2016-06-01 DIAGNOSIS — E063 Autoimmune thyroiditis: Secondary | ICD-10-CM | POA: Diagnosis present

## 2016-06-01 LAB — NM MYOCAR MULTI W/SPECT W/WALL MOTION / EF
CHL CUP RESTING HR STRESS: 77 {beats}/min
CSEPED: 0 min
CSEPEDS: 0 s
Estimated workload: 1 METS
MPHR: 139 {beats}/min
Peak HR: 104 {beats}/min
Percent HR: 74 %

## 2016-06-01 LAB — ECHOCARDIOGRAM COMPLETE
AVPHT: 770 ms
CHL CUP MV DEC (S): 194
CHL CUP TV REG PEAK VELOCITY: 247 cm/s
E/e' ratio: 12.24
EWDT: 194 ms
FS: 28 % (ref 28–44)
Height: 63 in
IV/PV OW: 1.02
LA ID, A-P, ES: 40 mm
LA diam index: 2.61 cm/m2
LA vol A4C: 54.4 ml
LA vol index: 37.3 mL/m2
LA vol: 57 mL
LDCA: 3.14 cm2
LEFT ATRIUM END SYS DIAM: 40 mm
LV E/e'average: 12.24
LV PW d: 14.7 mm — AB (ref 0.6–1.1)
LV TDI E'LATERAL: 5.98
LV TDI E'MEDIAL: 5.33
LV e' LATERAL: 5.98 cm/s
LVEEMED: 12.24
LVOTD: 20 mm
MV pk A vel: 82.5 m/s
MV pk E vel: 73.2 m/s
MVPG: 2 mmHg
RV TAPSE: 14.6 mm
TR max vel: 247 cm/s
Weight: 1840 oz

## 2016-06-01 LAB — HEPARIN LEVEL (UNFRACTIONATED)
HEPARIN UNFRACTIONATED: 0.13 [IU]/mL — AB (ref 0.30–0.70)
HEPARIN UNFRACTIONATED: 0.13 [IU]/mL — AB (ref 0.30–0.70)
Heparin Unfractionated: <0.1 IU/mL — ABNORMAL LOW (ref 0.30–0.70)

## 2016-06-01 LAB — LIPID PANEL
CHOL/HDL RATIO: 2.8 ratio
Cholesterol: 132 mg/dL (ref 0–200)
HDL: 48 mg/dL (ref >40)
LDL CALC: 76 mg/dL (ref 0–99)
TRIGLYCERIDES: 40 mg/dL (ref <150)
VLDL: 8 mg/dL (ref 0–40)

## 2016-06-01 LAB — BASIC METABOLIC PANEL
Anion gap: 7 (ref 5–15)
BUN: 23 mg/dL — AB (ref 6–20)
CHLORIDE: 101 mmol/L (ref 101–111)
CO2: 25 mmol/L (ref 22–32)
CREATININE: 1.39 mg/dL — AB (ref 0.44–1.00)
Calcium: 8.4 mg/dL — ABNORMAL LOW (ref 8.9–10.3)
GFR calc Af Amer: 40 mL/min — ABNORMAL LOW (ref >60)
GFR calc non Af Amer: 35 mL/min — ABNORMAL LOW (ref >60)
GLUCOSE: 111 mg/dL — AB (ref 65–99)
Potassium: 4 mmol/L (ref 3.5–5.1)
Sodium: 133 mmol/L — ABNORMAL LOW (ref 135–145)

## 2016-06-01 LAB — CBC
HEMATOCRIT: 36.4 % (ref 36.0–46.0)
HEMOGLOBIN: 11.4 g/dL — AB (ref 12.0–15.0)
MCH: 26.1 pg (ref 26.0–34.0)
MCHC: 31.3 g/dL (ref 30.0–36.0)
MCV: 83.5 fL (ref 78.0–100.0)
Platelets: 252 10*3/uL (ref 150–400)
RBC: 4.36 MIL/uL (ref 3.87–5.11)
RDW: 15.8 % — ABNORMAL HIGH (ref 11.5–15.5)
WBC: 15.1 10*3/uL — ABNORMAL HIGH (ref 4.0–10.5)

## 2016-06-01 LAB — PROTIME-INR
INR: 1.42 (ref 0.00–1.49)
PROTHROMBIN TIME: 17.4 s — AB (ref 11.6–15.2)

## 2016-06-01 LAB — TSH: TSH: 0.103 u[IU]/mL — ABNORMAL LOW (ref 0.350–4.500)

## 2016-06-01 LAB — TROPONIN I: TROPONIN I: 0.03 ng/mL — AB (ref <0.03)

## 2016-06-01 MED ORDER — REGADENOSON 0.4 MG/5ML IV SOLN
INTRAVENOUS | Status: AC
  Filled 2016-06-01: qty 5

## 2016-06-01 MED ORDER — TECHNETIUM TC 99M TETROFOSMIN IV KIT
30.0000 | PACK | Freq: Once | INTRAVENOUS | Status: AC | PRN
  Administered 2016-06-01: 30 via INTRAVENOUS

## 2016-06-01 MED ORDER — LEVOTHYROXINE SODIUM 100 MCG PO TABS
100.0000 ug | ORAL_TABLET | Freq: Every day | ORAL | Status: DC
  Administered 2016-06-02 – 2016-06-04 (×3): 100 ug via ORAL
  Filled 2016-06-01 (×3): qty 1

## 2016-06-01 MED ORDER — TECHNETIUM TC 99M TETROFOSMIN IV KIT
10.0000 | PACK | Freq: Once | INTRAVENOUS | Status: AC | PRN
  Administered 2016-06-01: 10 via INTRAVENOUS

## 2016-06-01 MED ORDER — SODIUM CHLORIDE 0.9 % IV BOLUS (SEPSIS)
250.0000 mL | Freq: Once | INTRAVENOUS | Status: AC
  Administered 2016-06-01: 250 mL via INTRAVENOUS

## 2016-06-01 MED ORDER — ASPIRIN 81 MG PO CHEW
81.0000 mg | CHEWABLE_TABLET | ORAL | Status: AC
  Administered 2016-06-02: 81 mg via ORAL
  Filled 2016-06-01: qty 1

## 2016-06-01 MED ORDER — SODIUM CHLORIDE 0.9 % WEIGHT BASED INFUSION
3.0000 mL/kg/h | INTRAVENOUS | Status: DC
  Administered 2016-06-02: 3 mL/kg/h via INTRAVENOUS

## 2016-06-01 MED ORDER — REGADENOSON 0.4 MG/5ML IV SOLN
0.4000 mg | Freq: Once | INTRAVENOUS | Status: AC
  Administered 2016-06-01: 0.4 mg via INTRAVENOUS
  Filled 2016-06-01: qty 5

## 2016-06-01 MED ORDER — SODIUM CHLORIDE 0.9 % IJ SOLN
80.0000 mg | INTRAVENOUS | Status: AC
  Administered 2016-06-01: 80 mg via INTRAVENOUS

## 2016-06-01 MED ORDER — SODIUM CHLORIDE 0.9 % WEIGHT BASED INFUSION: 1.0000 mL/kg/h | INTRAVENOUS | Status: DC

## 2016-06-01 NOTE — Progress Notes (Signed)
ANTICOAGULATION CONSULT NOTE  Pharmacy Consult for heparin Indication: chest pain/ACS  No Known Allergies  Patient Measurements: Height: 5\' 3"  (160 cm) Weight: 115 lb (52.164 kg) IBW/kg (Calculated) : 52.4 Heparin Dosing Weight: 52.2kg  Vital Signs: Temp: 97.5 F (36.4 C) (07/06 0524) Temp Source: Oral (07/06 0524) BP: 111/61 mmHg (07/06 0949) Pulse Rate: 88 (07/06 0952)  Labs:  Recent Labs  05/31/16 1010  05/31/16 1557 05/31/16 2053 06/01/16 0036 06/01/16 1147  HGB 13.0  --   --   --  11.4*  --   HCT 41.4  --   --   --  36.4  --   PLT 281  --   --   --  252  --   HEPARINUNFRC  --   --   --   --  0.13* 0.13*  CREATININE 0.64  --   --   --   --   --   TROPONINI  --   < > 0.03* <0.03 0.03*  --   < > = values in this interval not displayed.  Estimated Creatinine Clearance: 45.4 mL/min (by C-G formula based on Cr of 0.64).   Medical History: Past Medical History  Diagnosis Date  . Hypertension   . Thyroid disease   . Hypothyroidism   . Pneumonia ~ 1943  . Rheumatoid arthritis (HCC)     "all over; hands, feet, knees, joints" (05/31/2016)    Medications:  Infusions:  . heparin 850 Units/hr (06/01/16 0153)    Assessment: 83 yof presented to the ED with CP. CT shows AAA w/o dissection. Pharmacy consulted to dose heparin. Hg 11.4, plt wnl. She is not on anticoagulation PTA. Initial troponin is mildly elevated. No bleed documented.  HL remains subtherapeutic (0.13) on 850 units/h. Per RN, no issues with IV line or bleeding and drip not off for any period.  Goal of Therapy:  Heparin level 0.3-0.7 units/ml Monitor platelets by anticoagulation protocol: Yes   Plan:  - Increase heparin gtt to 1000 units/hr - Check an 8 hr HL - Daily HL and CBC - Monitor s/sx bleeding   08/02/16, PharmD, BCPS Clinical Pharmacist Pager [phone] 06/01/2016 1:06 PM

## 2016-06-01 NOTE — H&P (Signed)
Internal Medicine Attending Admission Note Date: 06/01/2016  Patient name: Whitney Mayer Medical record number: [id number] Date of birth: May 06, 1932 Age: 82 y.o. Gender: female  I saw and evaluated the patient. I reviewed the resident's note and I agree with the resident's findings and plan as documented in the resident's note.  Chief Complaint(s): Chest pain 24 hours.  History - key components related to admission:  Whitney Mayer is an 82 year old woman with a history of hypertension, rheumatoid arthritis, and Hashimoto's thyroiditis who presents with a one-day history of chest pain. She states she was in her usual state of health until the day prior to admission when, while eating lunch, she developed the sudden onset of sharp substernal chest pain that radiated to the back and neck and was associated with shortness of breath. The pain was made worse by deep breathing. At that point she developed a lot of burping that improve the pain. She also took aspirin, ibuprofen and tramadol which also helped with the pain. She denied any typical reflux symptoms. When the pain persisted, and in fact worsened over the next day she was brought to the emergency department for further evaluation. She states the pain is now continuous and is not associated with dizziness, diaphoresis, or nausea. She has no personal history of coronary artery disease, diabetes, or tobacco use. She has normal kidney function and denies a family history of premature coronary artery disease. She was admitted to the internal medicine teaching service for further evaluation and care.  When seen on rounds the day after admission she noted an improvement in her pain and she was able to sleep throughout the night. The pain still is worse with deep breathing and is substernal, but the quality is much less intense.  Physical Exam - key components related to admission:  Filed Vitals:   06/01/16 0948 06/01/16 0949 06/01/16 0951 06/01/16 0952  BP:  91/43 111/61    Pulse: 88 101 89 88  Temp:      TempSrc:      Resp:      Height:      Weight:      SpO2:       Gen.: Well-developed, well-nourished, woman lying comfortably in bed in no acute distress. Lungs: Clear to auscultation bilaterally without wheezes, rhonchi, or rales. Heart: Regular rate and rhythm without murmurs, rubs, or gallops. Abdomen: Soft, nontender, without rebound or guarding. Extremities significant swan neck and boutonniere deformities with ulnar deviation consistent with rheumatoid arthritis.  Lab results:  Basic Metabolic Panel:  Recent Labs  62/69/48 1010  NA 137  K 3.8  CL 105  CO2 26  GLUCOSE 110*  BUN 12  CREATININE 0.64  CALCIUM 9.1   CBC:  Recent Labs  05/31/16 1010 06/01/16 0036  WBC 14.0* 15.1*  HGB 13.0 11.4*  HCT 41.4 36.4  MCV 84.1 83.5  PLT 281 252   Cardiac Enzymes:  Recent Labs  05/31/16 1557 05/31/16 2053 06/01/16 0036  TROPONINI 0.03* <0.03 0.03*   D-Dimer:  Recent Labs  05/31/16 1010  DDIMER 1.31*   Fasting Lipid Panel:  Recent Labs  06/01/16 0036  CHOL 132  HDL 48  LDLCALC 76  TRIG 40  CHOLHDL 2.8   Thyroid Function Tests:  Recent Labs  06/01/16 0240  TSH 0.103*   Misc. Labs:  Hemoglobin A1c pending  Imaging results:  Dg Chest 2 View  05/31/2016  CLINICAL DATA:  Severe shortness of breath and chest pain since yesterday. EXAM: CHEST  2  VIEW COMPARISON:  None. FINDINGS: Linear densities at left lung base could represent atelectasis or scarring. The thoracic aorta appears to be large for size and has atherosclerotic calcifications. Distal descending thoracic aorta appears to measure at least 4 cm. The ascending thoracic aorta also appears prominent on the frontal view. Sclerosis and degenerative changes involving the left glenohumeral joint. No large pleural effusions. Evidence for a compression deformity along the superior endplate of T10. Age of this fracture is unknown. IMPRESSION: Enlargement  of the thoracic aorta and concern for a thoracic aortic aneurysm. This could better characterized with CT examination. Aortic atherosclerosis. Linear densities at the left lung base may represent scarring or atelectasis, particularly in the lingula. This could also be better characterized with CT. T10 compression fracture.  Fracture is age indeterminate. Electronically Signed   By: Richarda Overlie M.D.   On: 05/31/2016 10:41   Ct Angio Chest Pe W Or Wo Contrast  05/31/2016  CLINICAL DATA:  Chest pain and shortness of breath for 1 day EXAM: CT ANGIOGRAPHY CHEST WITH CONTRAST TECHNIQUE: Multidetector CT imaging of the chest was performed using the standard protocol during bolus administration of intravenous contrast. Multiplanar CT image reconstructions and MIPs were obtained to evaluate the vascular anatomy. CONTRAST:  80 mL Isovue 370 nonionic COMPARISON:  Chest radiograph May 31, 2016 FINDINGS: Cardiovascular: There is no demonstrable pulmonary embolus. Ascending thoracic aorta has a maximum transverse diameter of 4.6 x 4.6 cm. The entire thoracic aorta appears somewhat ectatic. Contrast bolus is not timed to optimize aortic visualization. No dissection is seen in the thoracic aorta to the extent that assessment for potential dissection can be made based on the contrast bolus. There is mild calcification in the proximal left common carotid and left subclavian arteries. Visualized great vessels otherwise appear unremarkable. There is no appreciable pericardial thickening. There is slight generalized cardiac enlargement. There are scattered foci of coronary artery calcification. Mediastinum/Nodes: Thyroid appears unremarkable. There is no appreciable thoracic adenopathy. Lungs/Pleura: There is patchy atelectatic change in both lung bases as well as in the posterior segments of each upper lobe. There is peribronchial thickening in the lateral segment of the right middle lobe. There is also central peribronchial  thickening in both lower lobes proximally. Upper Abdomen: In the visualized upper abdomen, there is atherosclerotic calcification in the upper abdominal aorta. Visualized upper abdominal structures otherwise are unremarkable. Musculoskeletal: There is anterior wedging of the T11 vertebral body. There are no blastic or lytic bone lesions. Review of the MIP images confirms the above findings. IMPRESSION: No demonstrable pulmonary embolus. Ascending thoracic aorta has a maximum transverse diameter 4.6 x 4.6 cm. Ascending thoracic aortic aneurysm. Recommend semi-annual imaging followup by CTA or MRA and referral to cardiothoracic surgery if not already obtained. This recommendation follows 2010 ACCF/AHA/AATS/ACR/ASA/SCA/SCAI/SIR/STS/SVM Guidelines for the Diagnosis and Management of Patients With Thoracic Aortic Disease. Circulation. 2010; 121: O160-V371. There are atherosclerotic foci of calcification in the aorta. There are scattered foci of coronary artery calcification. Pericardium is not appreciably thickened. Areas of bronchitis and atelectasis.  No frank consolidation. No apparent adenopathy. Electronically Signed   By: Bretta Bang III M.D.   On: 05/31/2016 11:44   Other results:  EKG (7/5 @ 9:44): Normal sinus tachycardia at 109 bpm, normal axis, normal intervals, no significant Q waves, no LVH by voltage, good R wave progression, T-wave flattening in the inferior limb leads. No prior ECGs immediately available.  EKG (7/5 @20 :33): Normal sinus rhythm at 78 bpm, questionable lead inversion of V2  and V3, more prominent T-wave inversions in III, V1 through V2 than on the prior ECG.  EKG (7/6): Normal sinus rhythm at 68 bpm, unchanged from the previous ECG on 7/5 at 20:33.  Assessment & Plan by Problem:  Whitney Mayer is an 82 year old woman with a history of hypertension, rheumatoid arthritis, and Hashimoto's thyroiditis who presents with a one-day history of chest pain. She states she was in her  usual state of health until the day prior to admission when, while eating lunch, she developed the sudden onset of sharp substernal chest pain that radiated to the back and neck and was associated with shortness of breath. Her chest pain was pleuritic in nature and a CT scan ruled out a clinically significant pulmonary embolism. She was also found to have a slightly elevated troponin, subtle T-wave changes inferiorly, and a 4.6 cm ascending thoracic aorta aneurysm. At this point, the working diagnosis is subtle myocardial ischemia because of the troponin leak. She had no evidence of ascending thoracic aortic dissection and I am less concerned this is the cause of her pain. Admittedly, her pain is very atypical for myocardial ischemia and this raises the possibility of alternative diagnoses in the differential to include a viral pleurisy or an atypical presentation of reflux esophagitis. The latter 2 diagnoses are much less ominous with regards to prognosis.  1) Atypical chest pain: Her troponin leak has been very mild and stable throughout her hospital course. Other than the inferior T wave changes that she has had she has not had any other dynamic changes on her ECG. Her symptoms have improved with nonsteroidal anti-inflammatory agents and tramadol. She is scheduled for a myocardial perfusion scan today to assess for evidence of reversible ischemia. She is also being treated with PPI therapy for presumed reflux. An echocardiogram will also be obtained to further assess the heart.  2) Disposition: If her pain continues to improve with nonsteroidal anti-inflammatories and PPI therapy and her echocardiogram is unremarkable with a Myoview showing no reversible ischemia she will likely be discharged home today with outpatient management of chest wall/pleuritic pain and anti-reflux therapy. Follow-up will be in the [facility] if she remains in the [place] for any period of time or with her primary  care provider back in [place] if she is planning to return soon. If she has an abnormality on her Myoview or echocardiogram further evaluation may be required as directed by the results of the studies.

## 2016-06-01 NOTE — Progress Notes (Signed)
ANTICOAGULATION CONSULT NOTE  Pharmacy Consult for heparin Indication: chest pain/ACS  No Known Allergies  Patient Measurements: Height: 5\' 3"  (160 cm) Weight: 115 lb (52.164 kg) IBW/kg (Calculated) : 52.4 Heparin Dosing Weight: 52.2kg  Vital Signs: Temp: 97.5 F (36.4 C) (07/06 2006) Temp Source: Oral (07/06 2006) BP: 94/60 mmHg (07/06 2152) Pulse Rate: 68 (07/06 2152)  Labs:  Recent Labs  05/31/16 1010  05/31/16 1557 05/31/16 2053 06/01/16 0036 06/01/16 1147 06/01/16 1714 06/01/16 2110  HGB 13.0  --   --   --  11.4*  --   --   --   HCT 41.4  --   --   --  36.4  --   --   --   PLT 281  --   --   --  252  --   --   --   LABPROT  --   --   --   --   --   --  17.4*  --   INR  --   --   --   --   --   --  1.42  --   HEPARINUNFRC  --   --   --   --  0.13* 0.13*  --  <0.10*  CREATININE 0.64  --   --   --   --   --  1.39*  --   TROPONINI  --   < > 0.03* <0.03 0.03*  --   --   --   < > = values in this interval not displayed.  Estimated Creatinine Clearance: 26.2 mL/min (by C-G formula based on Cr of 1.39).   Medical History: Past Medical History  Diagnosis Date  . Hypertension   . Thyroid disease   . Hypothyroidism   . Pneumonia ~ 1943  . Rheumatoid arthritis (HCC)     "all over; hands, feet, knees, joints" (05/31/2016)    Medications:  Infusions:  . [START ON 06/02/2016] sodium chloride     Followed by  . [START ON 06/02/2016] sodium chloride    . heparin 1,000 Units/hr (06/01/16 1404)    Assessment: 83 yof presented to the ED with CP. CT shows AAA w/o dissection. Pharmacy consulted to dose heparin. Hg 11.4, plt wnl. She is not on anticoagulation PTA. Initial troponin is mildly elevated. No bleed documented.  HL remains sub-therapeutic  Goal of Therapy:  Heparin level 0.3-0.7 units/ml Monitor platelets by anticoagulation protocol: Yes   Plan:  - Increase heparin gtt to 1150 - Check an 8 hr HL - Daily HL and CBC - Monitor s/sx bleeding  Thank  you 08/02/16, PharmD [phone] 06/01/2016 9:58 PM

## 2016-06-01 NOTE — Progress Notes (Signed)
   Subjective:  Patient was seen and examined this morning. She had an episode of severe pleuritic chest pain, substernal, last night that radiated to her back. She was given nitroglycerin with improvement. EKG showed TWI in V3-V5 and III and AVF which is similar to this morning, but slightly evolved from earlier in the admission. This morning, patient feels better. She states her chest pain is still there, but greatly improved at a 2/10. She admits to lightheadedness, but denies shortness of breath.   Objective: Vital signs in last 24 hours: Filed Vitals:   06/01/16 0948 06/01/16 0949 06/01/16 0951 06/01/16 0952  BP: 91/43 111/61    Pulse: 88 101 89 88  Temp:      TempSrc:      Resp:      Height:      Weight:      SpO2:       Physical Exam General: Vital signs reviewed.  Patient is elderly, in no acute distress and cooperative with exam.  Cardiovascular: RRR, S1 normal, S2 normal, no murmurs, gallops, or rubs. Mildly tender on palpation of sternum.  Pulmonary/Chest: Clear to auscultation bilaterally, no wheezes, rales, or rhonchi. Abdominal: Soft, non-tender, non-distended, BS +  Musculoskeletal: Bilateral boutonniere and swan neck changes on her bilateral hands. Extremities: No lower extremity edema bilaterally  Assessment/Plan: Principal Problem:   Chest pain Active Problems:   Hashimoto's thyroiditis   Thoracic ascending aortic aneurysm (HCC)   RA (rheumatoid arthritis) (HCC)   Coronary artery calcification   Aortic calcification (HCC)  Atypical Chest Pain: pain is pleuritic in nature, but CT ruled out any significant PE. Pain may be secondary to ischemia although troponins have been negative. Her pain does not seem typical of an acute  CTA Chest showed atherosclerotic foci of calcification in the aorta and coronary artery calcification. Cardiology performed a Myoview today and the read is pending. -Echo pending -Heparin per pharmacy -Myoview results pending -Pepcid 20 mg  daily -Nitroglycerin prn  -Cardiology following, appreciate recommendations  Ascending Thoracic Aortic Aneurysm: CTA Chest showed an ascending thoracic aorta with a maximum transverse diameter of 4.6 x 4.6 cm. Radiology recommended semi-annual imaging followup by CTA or MRA and referral to cardiothoracic surgery if not already obtained.  -Referral to CTS on discharge -Semi-Annual follow up  Normocytic Anemia: Hgb mildly decreased from 13 to 11.4. Possibly dilutional as no evidence of bleeding. -Repeat CBC tomorrow am  HTN: BPs low overnight in the 80/40s. Possibly due to nitrogylcerin. Antihypertensive therapy was held and patient was given small NS boluses. BP has improved this morning to 111/61. -Hold home BP  -Continue to monitor  Rheumatoid Arthritis: Patient reports being on Etodolac at home but no DMARD. -Would refer to outpatient Rheum versus follow up with Rheum in [place]  Osteoporosis: Vertebral fracture seen on CXR diagnostic of osteoporosis. Started on calcium and vitamin D. -Consider starting bisphosphonate on discharge   Hx Hashimoto's Thyroiditis: Currently on levothyroxine 112 mcg daily at home. TSH was low at 0.103.  -Decrease synthroid to 100 mcg daily -Repeat TSH in 6 weeks as outpatient  Dispo: Anticipated discharge in approximately 2-3 day(s).     Karlene Lineman, DO PGY-3 Internal Medicine Resident Pager # [phone] 06/01/2016 12:28 PM

## 2016-06-01 NOTE — Progress Notes (Signed)
Initial Nutrition Assessment  DOCUMENTATION CODES:   Not applicable  INTERVENTION:    Continue Ensure Enlive po BID, each supplement provides 350 kcal and 20 grams of protein  NUTRITION DIAGNOSIS:   Inadequate oral intake related to poor appetite as evidenced by per patient/family report  GOAL:   Patient will meet greater than or equal to 90% of their needs  MONITOR:   PO intake, Supplement acceptance, Labs, Weight trends, I & O's  REASON FOR ASSESSMENT:   Malnutrition Screening Tool  ASSESSMENT:   82 yo Female with a history of HTN, rheumatoid arthritis, and Hashimoto's thyroiditis who presents with a one-day history of chest pain. She states she was in her usual state of health until the day prior to admission when, while eating lunch, she developed the sudden onset of sharp substernal chest pain that radiated to the back and neck and was associated with shortness of breath.  Pt is a [employer] >> pt and other family members were having fellowship upon visit. RD spoke with patient's daughter who exited pt room. Daughter reports pt was eating poorly PTA >> was drinking some Ensure and/or Boost supplements. Also states pt has lost approximately 10 lbs in the last 6 months. Unable to complete Nutrition-Focused physical exam at this time.  Diet Order:  Diet Heart Room service appropriate?: Yes; Fluid consistency:: Thin  Skin:  Reviewed, no issues  Last BM:  7/4  Height:   Ht Readings from Last 1 Encounters:  05/31/16 5\' 3"  (1.6 m)    Weight:   Wt Readings from Last 1 Encounters:  05/31/16 115 lb (52.164 kg)    Ideal Body Weight:  52.2 kg  BMI:  Body mass index is 20.38 kg/(m^2).  Estimated Nutritional Needs:   Kcal:  1300-1500  Protein:  65-75 gm  Fluid:  >/= 1.5 L  EDUCATION NEEDS:   No education needs identified at this time  08/01/16, RD, LDN Pager #: [phone] After-Hours Pager #: [phone]

## 2016-06-01 NOTE — Progress Notes (Signed)
Interpreter Graciela Namihira for patient °

## 2016-06-01 NOTE — Progress Notes (Signed)
Subjective:  Ms. Garmany is an 82 year old female with a PMHx of severe Rheumatoid Arthritis, thyroid disease and HTN who's wheelchair bound who presented to the ED yesterday with 1 day of sharp 8/10 substernal chest pain that radiated to the neck and back.  Patient also complained of a sudden pleuritic pain that occurred simultaneously which has prevented her from taking deep breaths.  Patient continued to complain of 9/10 chest pain last night, and was treated with clonopin which helped.  She feels much better this morning, and rated her chest pain as 4/10, and said it was less sharp.  She is not experiencing as much pleuritic chest pain today, but she still is not taking deep breaths.  She slept all night and denies any nausea, vomiting, diaphoresis, or dyspnea.  Overnight events:  She complained of 9/10 chest pain, treated with klonopin. Patient experienced blood pressure of 72/49 at 5:24am and received a bolus of NS, but her blood pressure remained low at 80/50.    Objective: Vital signs in last 24 hours: Filed Vitals:   06/01/16 0948 06/01/16 0949 06/01/16 0951 06/01/16 0952  BP: 91/43 111/61    Pulse: 88 101 89 88  Temp:      TempSrc:      Resp:      Height:      Weight:      SpO2:       Weight change:  No intake or output data in the 24 hours ending 06/01/16 1048 General appearance: alert, cooperative, appears stated age and no distress Head: Normocephalic, without obvious abnormality, atraumatic Eyes: conjunctivae/corneas clear. PERRL, EOM's intact. Fundi benign. Back: symmetric, no curvature. ROM normal. No CVA tenderness. Lungs: clear to auscultation bilaterally Heart: regular rate and rhythm, S1, S2 normal, no murmur, click, rub or gallop Extremities: extremities normal, atraumatic, no cyanosis or edema   Lab Results:    Micro Results: No results found for this or any previous visit (from the past 240 hour(s)). Studies/Results: Dg Chest 2 View  05/31/2016  CLINICAL  DATA:  Severe shortness of breath and chest pain since yesterday. EXAM: CHEST  2 VIEW COMPARISON:  None. FINDINGS: Linear densities at left lung base could represent atelectasis or scarring. The thoracic aorta appears to be large for size and has atherosclerotic calcifications. Distal descending thoracic aorta appears to measure at least 4 cm. The ascending thoracic aorta also appears prominent on the frontal view. Sclerosis and degenerative changes involving the left glenohumeral joint. No large pleural effusions. Evidence for a compression deformity along the superior endplate of T10. Age of this fracture is unknown. IMPRESSION: Enlargement of the thoracic aorta and concern for a thoracic aortic aneurysm. This could better characterized with CT examination. Aortic atherosclerosis. Linear densities at the left lung base may represent scarring or atelectasis, particularly in the lingula. This could also be better characterized with CT. T10 compression fracture.  Fracture is age indeterminate. Electronically Signed   By: Richarda Overlie M.D.   On: 05/31/2016 10:41   Ct Angio Chest Pe W Or Wo Contrast  05/31/2016  CLINICAL DATA:  Chest pain and shortness of breath for 1 day EXAM: CT ANGIOGRAPHY CHEST WITH CONTRAST TECHNIQUE: Multidetector CT imaging of the chest was performed using the standard protocol during bolus administration of intravenous contrast. Multiplanar CT image reconstructions and MIPs were obtained to evaluate the vascular anatomy. CONTRAST:  80 mL Isovue 370 nonionic COMPARISON:  Chest radiograph May 31, 2016 FINDINGS: Cardiovascular: There is no demonstrable pulmonary  embolus. Ascending thoracic aorta has a maximum transverse diameter of 4.6 x 4.6 cm. The entire thoracic aorta appears somewhat ectatic. Contrast bolus is not timed to optimize aortic visualization. No dissection is seen in the thoracic aorta to the extent that assessment for potential dissection can be made based on the contrast bolus.  There is mild calcification in the proximal left common carotid and left subclavian arteries. Visualized great vessels otherwise appear unremarkable. There is no appreciable pericardial thickening. There is slight generalized cardiac enlargement. There are scattered foci of coronary artery calcification. Mediastinum/Nodes: Thyroid appears unremarkable. There is no appreciable thoracic adenopathy. Lungs/Pleura: There is patchy atelectatic change in both lung bases as well as in the posterior segments of each upper lobe. There is peribronchial thickening in the lateral segment of the right middle lobe. There is also central peribronchial thickening in both lower lobes proximally. Upper Abdomen: In the visualized upper abdomen, there is atherosclerotic calcification in the upper abdominal aorta. Visualized upper abdominal structures otherwise are unremarkable. Musculoskeletal: There is anterior wedging of the T11 vertebral body. There are no blastic or lytic bone lesions. Review of the MIP images confirms the above findings. IMPRESSION: No demonstrable pulmonary embolus. Ascending thoracic aorta has a maximum transverse diameter 4.6 x 4.6 cm. Ascending thoracic aortic aneurysm. Recommend semi-annual imaging followup by CTA or MRA and referral to cardiothoracic surgery if not already obtained. This recommendation follows 2010 ACCF/AHA/AATS/ACR/ASA/SCA/SCAI/SIR/STS/SVM Guidelines for the Diagnosis and Management of Patients With Thoracic Aortic Disease. Circulation. 2010; 121: W098-J191. There are atherosclerotic foci of calcification in the aorta. There are scattered foci of coronary artery calcification. Pericardium is not appreciably thickened. Areas of bronchitis and atelectasis.  No frank consolidation. No apparent adenopathy. Electronically Signed   By: Bretta Bang III M.D.   On: 05/31/2016 11:44   Medications:  Scheduled Meds: . calcium carbonate  1 tablet Oral Q breakfast  . cholecalciferol  1,000  Units Oral Daily  . etodolac  200 mg Oral TID  . famotidine  20 mg Oral Daily  . feeding supplement (ENSURE ENLIVE)  237 mL Oral BID BM  . levothyroxine  112 mcg Oral QAC breakfast  . regadenoson      . sodium chloride flush  3 mL Intravenous Q12H  . traMADol  50 mg Oral TID   Continuous Infusions: . heparin 850 Units/hr (06/01/16 0153)   PRN Meds:.clonazePAM, ketorolac, nitroGLYCERIN Assessment/Plan: Active Problems:   Chest pain:  Ms. Baskett continues to endorse 4/10 sharp substernal pain radiating to her neck and back with pleuritic pain, likely secondary to ACS.  The sharp nature and radiation pattern of patient's pain continues to concern [place] for ACS.  Her troponins are only mildly elevated at .03, <.03, .03, and .04.  Her most recent EKG showed T wave inversions in the septal leads, also raising our suspicion for infarction. Patient has received an ECHO which has not been read yet.  Stress test has been completed, but not read yet.  She is currently sub-therapeutic on heparin, so pharmacy will be increasing her dosage.  Also on our differential is a pulmonary embolism due to the sudden pleuritic chest pain.  A CTA did not note an embolism, and her d-dimer level was low at 1.31, making this less likely.  We are also concerned about a aortic dissection, due to the nature of her pain radiating to her back, her widened mediastinum on CXR, and her hypotension.  This was also not noted on her CTA.  Lastly, patient continues  to endorse some pain on palpation and with her hx of rheumotoid arthritis, there could be a component of costochondritis. -Continue to monitor on telemetry -trend troponins -Follow up ECHO and stress test -pharmacy is increasing heparin to therapeutic dose  Thyroid disease: Patient has history of Hashimoto's thyroiditis.  Her TSH is low at .103. -decrease synthroid dosage from 112mg  to 100mg  once daily  HTN: patients blood pressure has been very low during her admission.   This may be due to nitrogyclerin, but we will continue to monitor her BP and clinical status.  Her hemoglobin has also dropped from 13 to 11.4, which may be 2/2 two fluid boluses. -continue to support BP with IV fluids -check CBC in morning to make sure patient is not actively losing blood  Rheumatoid arthritis:  Patient's pain is managed with aspirin, tramodol, and etodolac. She is not on DMARD therapy.  Her rheumatologist in and is managing this problem. -continue home meds for pain     This is a Note.  The care of the patient was discussed with Dr. [place] and the assessment and plan formulated with their assistance.  Please see their attached note for official documentation of the daily encounter.     Psychologist, occupational, Med Student 06/01/2016, 10:48 AM

## 2016-06-01 NOTE — Progress Notes (Signed)
Nuclear study is intermediate risk with reversible perfusion in the mid and basilar segment of lateral wall. She is agreeable to left heart cath tomorrow. Will arrange and put in pre cath orders.   The risks and benefits of a cardiac catheterization including, but not limited to, death, stroke, MI, kidney damage and bleeding were discussed with the patient who indicates understanding and agrees to proceed.

## 2016-06-01 NOTE — Progress Notes (Signed)
*  PRELIMINARY RESULTS* Echocardiogram 2D Echocardiogram has been performed.  Jeryl Columbia 06/01/2016, 1:50 PM

## 2016-06-01 NOTE — Progress Notes (Addendum)
Subjective:  Continues to have CP, relieved by SL NTG last PM  Objective:  Temp:  [97.5 F (36.4 C)-99 F (37.2 C)] 97.5 F (36.4 C) (07/06 0524) Pulse Rate:  [66-114] 66 (07/06 0524) Resp:  [16-28] 21 (07/06 0524) BP: (72-141)/(49-95) 80/50 mmHg (07/06 0542) SpO2:  [93 %-100 %] 100 % (07/06 0524) Weight:  [115 lb (52.164 kg)] 115 lb (52.164 kg) (07/05 0945) Weight change:   Intake/Output from previous day:    Intake/Output from this shift:    Physical Exam: General appearance: alert and no distress Neck: no adenopathy, no carotid bruit, no JVD, supple, symmetrical, trachea midline and thyroid not enlarged, symmetric, no tenderness/mass/nodules Lungs: clear to auscultation bilaterally Heart: regular rate and rhythm, S1, S2 normal, no murmur, click, rub or gallop Extremities: extremities normal, atraumatic, no cyanosis or edema  Lab Results: Results for orders placed or performed during the hospital encounter of 05/31/16 (from the past 48 hour(s))  Basic metabolic panel     Status: Abnormal   Collection Time: 05/31/16 10:10 AM  Result Value Ref Range   Sodium 137 135 - 145 mmol/L   Potassium 3.8 3.5 - 5.1 mmol/L   Chloride 105 101 - 111 mmol/L   CO2 26 22 - 32 mmol/L   Glucose, Bld 110 (H) 65 - 99 mg/dL   BUN 12 6 - 20 mg/dL   Creatinine, Ser 0.64 0.44 - 1.00 mg/dL   Calcium 9.1 8.9 - 10.3 mg/dL   GFR calc non Af Amer >60 >60 mL/min   GFR calc Af Amer >60 >60 mL/min    Comment: (NOTE) The eGFR has been calculated using the CKD EPI equation. This calculation has not been validated in all clinical situations. eGFR's persistently <60 mL/min signify possible Chronic Kidney Disease.    Anion gap 6 5 - 15  CBC     Status: Abnormal   Collection Time: 05/31/16 10:10 AM  Result Value Ref Range   WBC 14.0 (H) 4.0 - 10.5 K/uL   RBC 4.92 3.87 - 5.11 MIL/uL   Hemoglobin 13.0 12.0 - 15.0 g/dL   HCT 41.4 36.0 - 46.0 %   MCV 84.1 78.0 - 100.0 fL   MCH 26.4 26.0 -  34.0 pg   MCHC 31.4 30.0 - 36.0 g/dL   RDW 15.8 (H) 11.5 - 15.5 %   Platelets 281 150 - 400 K/uL  D-dimer, quantitative (not at [facility])     Status: Abnormal   Collection Time: 05/31/16 10:10 AM  Result Value Ref Range   D-Dimer, Quant 1.31 (H) 0.00 - 0.50 ug/mL-FEU    Comment: (NOTE) At the manufacturer cut-off of 0.50 ug/mL FEU, this assay has been documented to exclude PE with a sensitivity and negative predictive value of 97 to 99%.  At this time, this assay has not been approved by the FDA to exclude DVT/VTE. Results should be correlated with clinical presentation.   I-stat troponin, ED     Status: None   Collection Time: 05/31/16 10:35 AM  Result Value Ref Range   Troponin i, poc 0.05 0.00 - 0.08 ng/mL   Comment 3            Comment: Due to the release kinetics of cTnI, a negative result within the first hours of the onset of symptoms does not rule out myocardial infarction with certainty. If myocardial infarction is still suspected, repeat the test at appropriate intervals.   Troponin I     Status: Abnormal  Collection Time: 05/31/16 12:41 PM  Result Value Ref Range   Troponin I 0.04 (HH) <0.03 ng/mL    Comment: CRITICAL RESULT CALLED TO, READ BACK BY AND VERIFIED WITH: W CHILDRESS,RN 1331 05/31/16 D BRADLEY   Troponin I (q 6hr x 3)     Status: Abnormal   Collection Time: 05/31/16  3:57 PM  Result Value Ref Range   Troponin I 0.03 (HH) <0.03 ng/mL    Comment: CRITICAL VALUE NOTED.  VALUE IS CONSISTENT WITH PREVIOUSLY REPORTED AND CALLED VALUE.  Troponin I (q 6hr x 3)     Status: None   Collection Time: 05/31/16  8:53 PM  Result Value Ref Range   Troponin I <0.03 <0.03 ng/mL  Troponin I (q 6hr x 3)     Status: Abnormal   Collection Time: 06/01/16 12:36 AM  Result Value Ref Range   Troponin I 0.03 (HH) <0.03 ng/mL    Comment: CRITICAL VALUE NOTED.  VALUE IS CONSISTENT WITH PREVIOUSLY REPORTED AND CALLED VALUE.  Heparin level (unfractionated)     Status: Abnormal    Collection Time: 06/01/16 12:36 AM  Result Value Ref Range   Heparin Unfractionated 0.13 (L) 0.30 - 0.70 IU/mL    Comment:        IF HEPARIN RESULTS ARE BELOW EXPECTED VALUES, AND PATIENT DOSAGE HAS BEEN CONFIRMED, SUGGEST FOLLOW UP TESTING OF ANTITHROMBIN III LEVELS.   CBC     Status: Abnormal   Collection Time: 06/01/16 12:36 AM  Result Value Ref Range   WBC 15.1 (H) 4.0 - 10.5 K/uL   RBC 4.36 3.87 - 5.11 MIL/uL   Hemoglobin 11.4 (L) 12.0 - 15.0 g/dL   HCT 36.4 36.0 - 46.0 %   MCV 83.5 78.0 - 100.0 fL   MCH 26.1 26.0 - 34.0 pg   MCHC 31.3 30.0 - 36.0 g/dL   RDW 15.8 (H) 11.5 - 15.5 %   Platelets 252 150 - 400 K/uL  Lipid panel     Status: None   Collection Time: 06/01/16 12:36 AM  Result Value Ref Range   Cholesterol 132 0 - 200 mg/dL   Triglycerides 40 <150 mg/dL   HDL 48 >40 mg/dL   Total CHOL/HDL Ratio 2.8 RATIO   VLDL 8 0 - 40 mg/dL   LDL Cholesterol 76 0 - 99 mg/dL    Comment:        Total Cholesterol/HDL:CHD Risk Coronary Heart Disease Risk Table                     Men   Women  1/2 Average Risk   3.4   3.3  Average Risk       5.0   4.4  2 X Average Risk   9.6   7.1  3 X Average Risk  23.4   11.0        Use the calculated Patient Ratio above and the CHD Risk Table to determine the patient's CHD Risk.        ATP III CLASSIFICATION (LDL):  <100     mg/dL   Optimal  100-129  mg/dL   Near or Above                    Optimal  130-159  mg/dL   Borderline  160-189  mg/dL   High  >190     mg/dL   Very High   TSH     Status: Abnormal   Collection Time: 06/01/16  2:40 AM  Result Value Ref Range   TSH 0.103 (L) 0.350 - 4.500 uIU/mL    Imaging: Imaging results have been reviewed  Tele- NSR  Assessment/Plan:   1. Active Problems: 2.   Chest pain 3.   Time Spent Directly with Patient:  15 minutes  Length of Stay:   Pt continued to have constant atyp CP last night relieved with SL NTG. Exam benign. Enz neg. For 2D and Lexiscan today.  Quay Burow 06/01/2016, 9:02 AM   Addendum- MV today + for inferolateral wall ischemia. Based on this we will arrange cardiac cath for tomorrow.  Lorretta Harp, M.D., [place], [facility], [place] [address] [place], [place]  [id number]  [phone] 06/01/2016 3:43 PM

## 2016-06-01 NOTE — Progress Notes (Signed)
ANTICOAGULATION CONSULT NOTE - Follow Up Consult  Pharmacy Consult for heparin Indication: chest pain/ACS  Labs:  Recent Labs  05/31/16 1010 05/31/16 1241 05/31/16 1557 05/31/16 2053 06/01/16 0036  HGB 13.0  --   --   --  11.4*  HCT 41.4  --   --   --  36.4  PLT 281  --   --   --  252  HEPARINUNFRC  --   --   --   --  0.13*  CREATININE 0.64  --   --   --   --   TROPONINI  --  0.04* 0.03* <0.03  --      Assessment: 82yo female subtherapeutic on heparin with initial dosing for CP; CT shows AAA but no evidence of dissection.  Goal of Therapy:  Heparin level 0.3-0.7 units/ml   Plan:  Will increase heparin gtt by 3 units/kg/hr to 850 units/hr and check level in 8hr.  Vernard Gambles, PharmD, BCPS  06/01/2016,1:17 AM

## 2016-06-02 ENCOUNTER — Inpatient Hospital Stay (HOSPITAL_COMMUNITY): Payer: Medicare (Managed Care)

## 2016-06-02 ENCOUNTER — Observation Stay (HOSPITAL_COMMUNITY): Payer: Medicare (Managed Care) | Admitting: Critical Care Medicine

## 2016-06-02 ENCOUNTER — Encounter (HOSPITAL_COMMUNITY)
Admission: EM | Disposition: A | Discharge status: Home / Self Care | Payer: Self-pay | Source: Home / Self Care | Attending: Internal Medicine

## 2016-06-02 ENCOUNTER — Encounter (HOSPITAL_COMMUNITY): Payer: Self-pay | Admitting: Interventional Cardiology

## 2016-06-02 DIAGNOSIS — K219 Gastro-esophageal reflux disease without esophagitis: Secondary | ICD-10-CM | POA: Diagnosis present

## 2016-06-02 DIAGNOSIS — M069 Rheumatoid arthritis, unspecified: Secondary | ICD-10-CM | POA: Diagnosis present

## 2016-06-02 DIAGNOSIS — I712 Thoracic aortic aneurysm, without rupture: Secondary | ICD-10-CM | POA: Diagnosis present

## 2016-06-02 DIAGNOSIS — E063 Autoimmune thyroiditis: Secondary | ICD-10-CM | POA: Diagnosis present

## 2016-06-02 DIAGNOSIS — I209 Angina pectoris, unspecified: Secondary | ICD-10-CM | POA: Diagnosis not present

## 2016-06-02 DIAGNOSIS — N390 Urinary tract infection, site not specified: Secondary | ICD-10-CM | POA: Diagnosis not present

## 2016-06-02 DIAGNOSIS — R079 Chest pain, unspecified: Secondary | ICD-10-CM | POA: Diagnosis present

## 2016-06-02 DIAGNOSIS — I951 Orthostatic hypotension: Secondary | ICD-10-CM | POA: Diagnosis not present

## 2016-06-02 DIAGNOSIS — T508X5A Adverse effect of diagnostic agents, initial encounter: Secondary | ICD-10-CM | POA: Diagnosis not present

## 2016-06-02 DIAGNOSIS — I272 Other secondary pulmonary hypertension: Secondary | ICD-10-CM | POA: Diagnosis present

## 2016-06-02 DIAGNOSIS — R778 Other specified abnormalities of plasma proteins: Secondary | ICD-10-CM | POA: Diagnosis not present

## 2016-06-02 DIAGNOSIS — I10 Essential (primary) hypertension: Secondary | ICD-10-CM | POA: Diagnosis present

## 2016-06-02 DIAGNOSIS — M810 Age-related osteoporosis without current pathological fracture: Secondary | ICD-10-CM | POA: Diagnosis present

## 2016-06-02 DIAGNOSIS — N141 Nephropathy induced by other drugs, medicaments and biological substances: Secondary | ICD-10-CM | POA: Diagnosis not present

## 2016-06-02 DIAGNOSIS — I2510 Atherosclerotic heart disease of native coronary artery without angina pectoris: Secondary | ICD-10-CM

## 2016-06-02 DIAGNOSIS — I9589 Other hypotension: Secondary | ICD-10-CM

## 2016-06-02 DIAGNOSIS — R0789 Other chest pain: Secondary | ICD-10-CM | POA: Diagnosis not present

## 2016-06-02 DIAGNOSIS — N179 Acute kidney failure, unspecified: Secondary | ICD-10-CM

## 2016-06-02 DIAGNOSIS — D649 Anemia, unspecified: Secondary | ICD-10-CM | POA: Diagnosis present

## 2016-06-02 DIAGNOSIS — I2699 Other pulmonary embolism without acute cor pulmonale: Secondary | ICD-10-CM | POA: Diagnosis not present

## 2016-06-02 DIAGNOSIS — I700 Atherosclerosis of aorta: Secondary | ICD-10-CM | POA: Diagnosis not present

## 2016-06-02 DIAGNOSIS — Z79899 Other long term (current) drug therapy: Secondary | ICD-10-CM | POA: Diagnosis not present

## 2016-06-02 DIAGNOSIS — Z993 Dependence on wheelchair: Secondary | ICD-10-CM | POA: Diagnosis not present

## 2016-06-02 DIAGNOSIS — I959 Hypotension, unspecified: Secondary | ICD-10-CM | POA: Diagnosis present

## 2016-06-02 DIAGNOSIS — I480 Paroxysmal atrial fibrillation: Secondary | ICD-10-CM

## 2016-06-02 HISTORY — PX: CARDIOVERSION: SHX1299

## 2016-06-02 HISTORY — PX: CARDIAC CATHETERIZATION: SHX172

## 2016-06-02 LAB — BASIC METABOLIC PANEL
ANION GAP: 8 (ref 5–15)
BUN: 27 mg/dL — ABNORMAL HIGH (ref 6–20)
CALCIUM: 8.3 mg/dL — AB (ref 8.9–10.3)
CO2: 23 mmol/L (ref 22–32)
CREATININE: 1.13 mg/dL — AB (ref 0.44–1.00)
Chloride: 102 mmol/L (ref 101–111)
GFR, EST AFRICAN AMERICAN: 51 mL/min — AB (ref >60)
GFR, EST NON AFRICAN AMERICAN: 44 mL/min — AB (ref >60)
GLUCOSE: 80 mg/dL (ref 65–99)
Potassium: 3.8 mmol/L (ref 3.5–5.1)
Sodium: 133 mmol/L — ABNORMAL LOW (ref 135–145)

## 2016-06-02 LAB — URINALYSIS, ROUTINE W REFLEX MICROSCOPIC
GLUCOSE, UA: NEGATIVE mg/dL
HGB URINE DIPSTICK: NEGATIVE
Ketones, ur: 15 mg/dL — AB
Nitrite: NEGATIVE
PH: 5 (ref 5.0–8.0)
PROTEIN: 30 mg/dL — AB
SPECIFIC GRAVITY, URINE: 1.02 (ref 1.005–1.030)

## 2016-06-02 LAB — CBC
HCT: 29.5 % — ABNORMAL LOW (ref 36.0–46.0)
HCT: 35.9 % — ABNORMAL LOW (ref 36.0–46.0)
Hemoglobin: 9.3 g/dL — ABNORMAL LOW (ref 12.0–15.0)
Hemoglobin: 11.3 g/dL — ABNORMAL LOW (ref 12.0–15.0)
MCH: 26.3 pg (ref 26.0–34.0)
MCH: 26.6 pg (ref 26.0–34.0)
MCHC: 31.5 g/dL (ref 30.0–36.0)
MCHC: 31.5 g/dL (ref 30.0–36.0)
MCV: 83.6 fL (ref 78.0–100.0)
MCV: 84.5 fL (ref 78.0–100.0)
PLATELETS: 217 10*3/uL (ref 150–400)
PLATELETS: 295 10*3/uL (ref 150–400)
RBC: 3.53 MIL/uL — AB (ref 3.87–5.11)
RBC: 4.25 MIL/uL (ref 3.87–5.11)
RDW: 16.1 % — AB (ref 11.5–15.5)
RDW: 16.3 % — ABNORMAL HIGH (ref 11.5–15.5)
WBC: 12.5 10*3/uL — AB (ref 4.0–10.5)
WBC: 19.3 10*3/uL — ABNORMAL HIGH (ref 4.0–10.5)

## 2016-06-02 LAB — URINE MICROSCOPIC-ADD ON: RBC / HPF: NONE SEEN RBC/hpf (ref 0–5)

## 2016-06-02 LAB — MRSA PCR SCREENING: MRSA by PCR: NEGATIVE

## 2016-06-02 LAB — HEMOGLOBIN A1C
HEMOGLOBIN A1C: 5.4 % (ref 4.8–5.6)
MEAN PLASMA GLUCOSE: 108 mg/dL

## 2016-06-02 LAB — HEPARIN LEVEL (UNFRACTIONATED): HEPARIN UNFRACTIONATED: 0.16 [IU]/mL — AB (ref 0.30–0.70)

## 2016-06-02 SURGERY — LEFT HEART CATH AND CORONARY ANGIOGRAPHY
Anesthesia: LOCAL

## 2016-06-02 SURGERY — CARDIOVERSION
Anesthesia: Choice

## 2016-06-02 MED ORDER — ACETAMINOPHEN 325 MG PO TABS: 650.0000 mg | ORAL_TABLET | ORAL | Status: DC | PRN

## 2016-06-02 MED ORDER — SODIUM CHLORIDE 0.9% FLUSH: 3.0000 mL | INTRAVENOUS | Status: DC | PRN

## 2016-06-02 MED ORDER — VERAPAMIL HCL 2.5 MG/ML IV SOLN
INTRAVENOUS | Status: DC | PRN
  Administered 2016-06-02: 12:00:00 via INTRA_ARTERIAL

## 2016-06-02 MED ORDER — FENTANYL CITRATE (PF) 100 MCG/2ML IJ SOLN
INTRAMUSCULAR | Status: DC | PRN
  Administered 2016-06-02: 50 ug via INTRAVENOUS

## 2016-06-02 MED ORDER — MIDAZOLAM HCL 2 MG/2ML IJ SOLN
INTRAMUSCULAR | Status: DC | PRN
  Administered 2016-06-02: 1 mg via INTRAVENOUS

## 2016-06-02 MED ORDER — LIDOCAINE HCL (PF) 1 % IJ SOLN
INTRAMUSCULAR | Status: DC | PRN
  Administered 2016-06-02: 3 mL via SUBCUTANEOUS

## 2016-06-02 MED ORDER — HEPARIN (PORCINE) IN NACL 100-0.45 UNIT/ML-% IJ SOLN
1300.0000 [IU]/h | INTRAMUSCULAR | Status: DC
  Administered 2016-06-02: 1300 [IU]/h via INTRAVENOUS
  Filled 2016-06-02: qty 250

## 2016-06-02 MED ORDER — NOREPINEPHRINE BITARTRATE 1 MG/ML IV SOLN
0.0000 ug/min | INTRAVENOUS | Status: DC
  Administered 2016-06-02: 10 ug/min via INTRAVENOUS
  Filled 2016-06-02: qty 4

## 2016-06-02 MED ORDER — APIXABAN 2.5 MG PO TABS
2.5000 mg | ORAL_TABLET | Freq: Two times a day (BID) | ORAL | Status: DC
  Administered 2016-06-02 – 2016-06-04 (×4): 2.5 mg via ORAL
  Filled 2016-06-02 (×4): qty 1

## 2016-06-02 MED ORDER — HEPARIN SODIUM (PORCINE) 1000 UNIT/ML IJ SOLN
INTRAMUSCULAR | Status: DC | PRN
  Administered 2016-06-02: 2500 [IU] via INTRAVENOUS

## 2016-06-02 MED ORDER — HEPARIN SODIUM (PORCINE) 1000 UNIT/ML IJ SOLN
INTRAMUSCULAR | Status: AC
  Filled 2016-06-02: qty 1

## 2016-06-02 MED ORDER — HEPARIN (PORCINE) IN NACL 100-0.45 UNIT/ML-% IJ SOLN: 1300.0000 [IU]/h | INTRAMUSCULAR | Status: DC

## 2016-06-02 MED ORDER — SODIUM CHLORIDE 0.9% FLUSH: 3.0000 mL | Freq: Two times a day (BID) | INTRAVENOUS | Status: DC

## 2016-06-02 MED ORDER — DILTIAZEM HCL 100 MG IV SOLR: 5.0000 mg/h | INTRAVENOUS | Status: DC

## 2016-06-02 MED ORDER — HEPARIN (PORCINE) IN NACL 2-0.9 UNIT/ML-% IJ SOLN
INTRAMUSCULAR | Status: AC
  Filled 2016-06-02: qty 500

## 2016-06-02 MED ORDER — SODIUM CHLORIDE 0.9 % IV BOLUS (SEPSIS): 250.0000 mL | Freq: Once | INTRAVENOUS | Status: DC

## 2016-06-02 MED ORDER — AMIODARONE HCL IN DEXTROSE 360-4.14 MG/200ML-% IV SOLN
60.0000 mg/h | INTRAVENOUS | Status: AC
  Administered 2016-06-02 (×2): 60 mg/h via INTRAVENOUS
  Filled 2016-06-02: qty 200

## 2016-06-02 MED ORDER — HEPARIN (PORCINE) IN NACL 2-0.9 UNIT/ML-% IJ SOLN
INTRAMUSCULAR | Status: AC
  Filled 2016-06-02: qty 1000

## 2016-06-02 MED ORDER — AMIODARONE HCL IN DEXTROSE 360-4.14 MG/200ML-% IV SOLN: 30.0000 mg/h | INTRAVENOUS | Status: DC

## 2016-06-02 MED ORDER — FENTANYL CITRATE (PF) 100 MCG/2ML IJ SOLN
INTRAMUSCULAR | Status: AC
  Filled 2016-06-02: qty 2

## 2016-06-02 MED ORDER — DEXTROSE 5 % IV SOLN
0.0000 ug/min | INTRAVENOUS | Status: DC
  Filled 2016-06-02: qty 16

## 2016-06-02 MED ORDER — SODIUM CHLORIDE 0.9 % IV SOLN: INTRAVENOUS | Status: DC

## 2016-06-02 MED ORDER — DEXTROSE 5 % IV SOLN
5.0000 mg/h | INTRAVENOUS | Status: DC
  Filled 2016-06-02: qty 100

## 2016-06-02 MED ORDER — SODIUM CHLORIDE 0.9 % IV SOLN: 250.0000 mL | INTRAVENOUS | Status: DC | PRN

## 2016-06-02 MED ORDER — METOPROLOL TARTRATE 5 MG/5ML IV SOLN
INTRAVENOUS | Status: AC
  Filled 2016-06-02: qty 5

## 2016-06-02 MED ORDER — IOPAMIDOL (ISOVUE-370) INJECTION 76%
INTRAVENOUS | Status: DC | PRN
  Administered 2016-06-02: 80 mL via INTRA_ARTERIAL

## 2016-06-02 MED ORDER — NOREPINEPHRINE BITARTRATE 1 MG/ML IV SOLN
0.0000 ug/min | INTRAVENOUS | Status: DC
  Filled 2016-06-02: qty 4

## 2016-06-02 MED ORDER — HEPARIN (PORCINE) IN NACL 2-0.9 UNIT/ML-% IJ SOLN
INTRAMUSCULAR | Status: DC | PRN
  Administered 2016-06-02: 500 mL
  Administered 2016-06-02: 1000 mL

## 2016-06-02 MED ORDER — VERAPAMIL HCL 2.5 MG/ML IV SOLN
INTRAVENOUS | Status: AC
  Filled 2016-06-02: qty 2

## 2016-06-02 MED ORDER — ONDANSETRON HCL 4 MG/2ML IJ SOLN: 4.0000 mg | Freq: Four times a day (QID) | INTRAMUSCULAR | Status: DC | PRN

## 2016-06-02 MED ORDER — SODIUM CHLORIDE 0.9 % WEIGHT BASED INFUSION: 3.0000 mL/kg/h | INTRAVENOUS | Status: AC

## 2016-06-02 MED ORDER — DILTIAZEM LOAD VIA INFUSION
10.0000 mg | Freq: Once | INTRAVENOUS | Status: DC
Start: 2016-06-02 — End: 2016-06-03

## 2016-06-02 MED ORDER — LIDOCAINE HCL (PF) 1 % IJ SOLN
INTRAMUSCULAR | Status: AC
  Filled 2016-06-02: qty 30

## 2016-06-02 MED ORDER — MIDAZOLAM HCL 2 MG/2ML IJ SOLN
INTRAMUSCULAR | Status: AC
  Filled 2016-06-02: qty 2

## 2016-06-02 MED ORDER — AMIODARONE LOAD VIA INFUSION
150.0000 mg | Freq: Once | INTRAVENOUS | Status: AC
  Administered 2016-06-02: 150 mg via INTRAVENOUS
  Filled 2016-06-02: qty 83.34

## 2016-06-02 MED ORDER — IOPAMIDOL (ISOVUE-370) INJECTION 76%
INTRAVENOUS | Status: AC
  Filled 2016-06-02: qty 100

## 2016-06-02 MED ORDER — SODIUM CHLORIDE 0.9% FLUSH
3.0000 mL | Freq: Two times a day (BID) | INTRAVENOUS | Status: DC
  Administered 2016-06-03 – 2016-06-04 (×3): 3 mL via INTRAVENOUS

## 2016-06-02 MED ORDER — SODIUM CHLORIDE 0.9 % IV SOLN
INTRAVENOUS | Status: DC | PRN
  Administered 2016-06-02: 500 mL via INTRAVENOUS

## 2016-06-02 MED ORDER — METOPROLOL TARTRATE 5 MG/5ML IV SOLN
INTRAVENOUS | Status: DC | PRN
  Administered 2016-06-02: 5 mg via INTRAVENOUS

## 2016-06-02 MED ORDER — SODIUM CHLORIDE 0.9 % IV BOLUS (SEPSIS)
500.0000 mL | Freq: Once | INTRAVENOUS | Status: AC
  Administered 2016-06-02: 500 mL via INTRAVENOUS

## 2016-06-02 MED ORDER — AMIODARONE HCL IN DEXTROSE 360-4.14 MG/200ML-% IV SOLN
INTRAVENOUS | Status: AC
  Filled 2016-06-02: qty 200

## 2016-06-02 MED ORDER — DEXTROSE 5 % IV SOLN
0.0000 ug/min | INTRAVENOUS | Status: DC
  Administered 2016-06-02: 5 ug/min via INTRAVENOUS
  Filled 2016-06-02: qty 4

## 2016-06-02 SURGICAL SUPPLY — 11 items
CATH INFINITI 5 FR JL3.5 (CATHETERS) ×2 IMPLANT
CATH INFINITI 5FR JL4 (CATHETERS) ×2 IMPLANT
CATH INFINITI JR4 5F (CATHETERS) ×2 IMPLANT
DEVICE RAD COMP TR BAND LRG (VASCULAR PRODUCTS) ×2 IMPLANT
GLIDESHEATH SLEND A-KIT 6F 22G (SHEATH) ×2 IMPLANT
KIT HEART LEFT (KITS) ×2 IMPLANT
PACK CARDIAC CATHETERIZATION (CUSTOM PROCEDURE TRAY) ×2 IMPLANT
TRANSDUCER W/STOPCOCK (MISCELLANEOUS) ×2 IMPLANT
TUBING CIL FLEX 10 FLL-RA (TUBING) ×2 IMPLANT
WIRE HI TORQ VERSACORE-J 145CM (WIRE) ×2 IMPLANT
WIRE SAFE-T 1.5MM-J .035X260CM (WIRE) ×2 IMPLANT

## 2016-06-02 NOTE — Care Management Note (Signed)
Case Management Note  Patient Details  Name: Qamar Rosman MRN: [id number] Date of Birth: 1931-11-28  Subjective/Objective:    Pt has complaint of chest pain and shortness of breath                Action/Plan:  [place] is from [place] traveling visiting family in [place] - prior to trip lived with adult daughter - wheelchair bound.  Pt plans on returning home to [place] next week - however if pt is not stable to travel pt will discharge to daughters home in [place].    Pt has PCP that she sees on regular basis.   Pt has cardiac cath today.  CM will continue to follow for discharge needs   Expected Discharge Date:                  Expected Discharge Plan:  Home/Self Care  In-House Referral:     Discharge planning Services  CM Consult  Post Acute Care Choice:    Choice offered to:     DME Arranged:    DME Agency:     HH Arranged:    HH Agency:     Status of Service:  In process, will continue to follow  If discussed at Long Length of Stay Meetings, dates discussed:    Additional Comments:  Cherylann Parr, RN 06/02/2016, 10:25 AM

## 2016-06-02 NOTE — Progress Notes (Signed)
Subjective: BPs low overnight, received fluid boluses. Hgb also noted to have dropped from 11.4 to 9.3. She reports she is feeling much better and that her chest pain has resolved. Reports her breathing is fine. Denies any melena or hematochezia.   Objective: Vital signs in last 24 hours: Filed Vitals:   06/01/16 2152 06/01/16 2320 06/02/16 0040 06/02/16 0516  BP: 94/60 81/50 93/57  95/50  Pulse: 68 67 70 70  Temp:    97.6 F (36.4 C)  TempSrc:    Oral  Resp:    20  Height:      Weight:      SpO2:    98%   Physical Exam General: elderly woman resting in bed, NAD HEENT: [place]/AT, EOMI, sclera anicteric, mucus membranes moist CV: RRR, no m/g/r Pulm: CTA bilaterally, breaths non-labored Abd: BS+, soft, non-tender Back: no tenderness to palpation Ext: warm, no peripheral edema. 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  Neuro: alert and oriented x 3  Assessment/Plan:  Atypical Chest Pain: Myoview revealed a small to moderate region of reversible perfusion in the mid and basilar segment of the lateral wall making this an intermediate study. She will undergo left heart cath today. Echo revealed a normal EF of 55-60%. MRA ordered to rule out an aortic dissection and was negative.  - Follow up left heart cath results - Heparin per pharmacy - Pepcid 20 mg daily - Nitroglycerin prn  - Cardiology following, appreciate recommendations  Ascending Thoracic Aortic Aneurysm: CTA Chest showed an ascending thoracic aorta with a maximum transverse diameter of 4.6 x 4.6 cm. Radiology recommended semi-annual imaging followup by CTA or MRA and referral to cardiothoracic surgery if not already obtained.  - Referral to CTS on discharge - Semi-Annual follow up  Normocytic Anemia: Hgb decreased again from 11.4 to 9.3. Possibly dilutional but doubtful as her Hgb has dropped 4 points since admission.  No obvious active bleeding at this time, but with her decreasing hemoglobin and rise in  BUN I am concerned for a GI bleed. Another thing to consider is a retroperitoneal bleed with an elderly woman on a heparin drip. She had no back or abdominal tenderness.  - Obtain FOBT - Monitor CBC closely   AKI: Cr increased from 0.64 to 1.39 yesterday and then 1.13 this morning. Her BUN also increased from 12 to 23 to 27. Likely from contrast she received from Myoview yesterday. Will make sure she is adequately hydrated as she will be getting additional contrast from cath today. - NS 250 cc bolus - NS @ 125 ml/hr - bmet in AM  HTN: BPs remained low in 80s-90s systolic overnight. She received two 250 ml boluses. BPs still in the 90s this morning.  - Continue to hold home BP meds - Continue to monitor, fluid as above   Rheumatoid Arthritis: Patient reports being on Etodolac at home but no DMARD. - Would refer to outpatient Rheum versus follow up with Rheum in  Osteoporosis: Vertebral fracture seen on CXR diagnostic of osteoporosis. Started on calcium and vitamin D. - Consider starting bisphosphonate on discharge   Hx Hashimoto's Thyroiditis: Currently on levothyroxine 112 mcg daily at home. TSH was low at 0.103.  - Continue synthroid 100 mcg daily - Repeat TSH in 6 weeks as outpatient  Diet: NPO for now, resume heart healthy after cath DVT PPPx: Heparin gtt Dispo: Anticipated discharge in approximately 1-2 day(s).     [place] MD, MPH Internal Medicine Resident, PGY-II  Pager: [phone] 06/02/2016 7:11 AM

## 2016-06-02 NOTE — Anesthesia Preprocedure Evaluation (Deleted)
Anesthesia Evaluation  Patient identified by MRN, date of birth, ID band Patient awake    Reviewed: Allergy & Precautions, NPO status , Patient's Chart, lab work & pertinent test results  Airway Mallampati: II  TM Distance: >3 FB Neck ROM: Full    Dental no notable dental hx. (+) Dental Advisory Given   Pulmonary neg pulmonary ROS,    Pulmonary exam normal breath sounds clear to auscultation       Cardiovascular hypertension, + CAD and + Peripheral Vascular Disease   Rhythm:Irregular Rate:Tachycardia     Neuro/Psych negative neurological ROS  negative psych ROS   GI/Hepatic negative GI ROS, Neg liver ROS,   Endo/Other  Hypothyroidism   Renal/GU negative Renal ROS  negative genitourinary   Musculoskeletal  (+) Arthritis ,   Abdominal   Peds negative pediatric ROS (+)  Hematology negative hematology ROS (+)   Anesthesia Other Findings   Reproductive/Obstetrics negative OB ROS                            Anesthesia Physical Anesthesia Plan  ASA: III  Anesthesia Plan: General   Post-op Pain Management:    Induction: Intravenous  Airway Management Planned: Mask  Additional Equipment:   Intra-op Plan:   Post-operative Plan:   Informed Consent: I have reviewed the patients History and Physical, chart, labs and discussed the procedure including the risks, benefits and alternatives for the proposed anesthesia with the patient or authorized representative who has indicated his/her understanding and acceptance.   Dental advisory given  Plan Discussed with: CRNA and Surgeon  Anesthesia Plan Comments:         Anesthesia Quick Evaluation

## 2016-06-02 NOTE — Interval H&P Note (Signed)
Cath Lab Visit (complete for each Cath Lab visit)  Clinical Evaluation Leading to the Procedure:   ACS: No.  Non-ACS:    Anginal Classification: CCS III  Anti-ischemic medical therapy: Maximal Therapy (2 or more classes of medications)  Non-Invasive Test Results: Intermediate-risk stress test findings: cardiac mortality 1-3%/year  Prior CABG: No previous CABG      History and Physical Interval Note:  06/02/2016 11:48 AM  Whitney Mayer  has presented today for surgery, with the diagnosis of chest pain  The various methods of treatment have been discussed with the patient and family. After consideration of risks, benefits and other options for treatment, the patient has consented to  Procedure(s): Left Heart Cath and Coronary Angiography (N/A) as a surgical intervention .  The patient's history has been reviewed, patient examined, no change in status, stable for surgery.  I have reviewed the patient's chart and labs.  Questions were answered to the patient's satisfaction.     Lyn Records III

## 2016-06-02 NOTE — Progress Notes (Signed)
Pt went into afib with RVR after cath. Cors and LV nl. Hgb has declined to mid 9 level for no reason. MRA neg for Ao dissec. LVEDP was low as well. Pt spontaneously converted to NSR  After IV amio and bolus. She has been persistently hypotensive after this. We will put on levophed , IVF bolus, stat CBC and 2D. She was Tx from tele 2W 08 to ICU (2H 13) for critical care.  Time spent with patient 4 pm- 5pm   ( > 1 hour crit care time)  Runell Gess, M.D., [facility]rel [facility], [facility] [address] [place], [place]  [address]  [phone] 06/02/2016 5:03 PM

## 2016-06-02 NOTE — Progress Notes (Signed)
Internal Medicine Attending  Date: 06/02/2016  Patient name: Whitney Mayer Medical record number: [id number] Date of birth: May 16, 1932 Age: 82 y.o. Gender: female  I saw and evaluated the patient. I reviewed the resident's note by Dr. Beckie Salts and I agree with the resident's findings and plans as documented in her progress note.  When seen on rounds this morning [name] was without complaints. She states that her chest pain has resolved. She has not seen any bleeding from the rectum or other site. She does state her 1 bowel movement during this admission was darker than usual. An aortic dissection of the thoracic ascending aorta has been ruled out with an MRA with contrast. We are assessing her falling hemoglobin with an FOBT and serial examinations. She is being hydrated prior to her cardiac cath given the slight bump in her BUN and creatinine felt to be related to the contrast she received during the CT angiogram. At this moment she is undergoing cardiac cath and further evaluation, therapy, and disposition are pending the results of this study. I suspect if everything is negative and no further evaluation or intervention is necessary she should be ready for discharge within the next 24 hours, if not later today.

## 2016-06-02 NOTE — Progress Notes (Signed)
Subjective: Whitney Mayer is an 82 year old female with a PMHx of severe RA, HTN, and thyroid disease who's wheelchair bound and presented to the ED 2 days ago with sharp substernal chest pain radiating to her back, and pleuritic chest pain.  Patient complained of a 9/10 chest pain on admission, which decreased to a 4/10 chest pain yesterday and nearly 0/10 chest pain today. She states she is able to take deeper breaths now without any pain.  She slept through the night, and states she feels great this morning. She denies any more sharp chest pains, dyspnea, diaphoresis, nausea or vomiting.   No acute event overnight.  She received a 250cc fluid bolus for hypotension.    Objective: Vital signs in last 24 hours: Filed Vitals:   06/01/16 2152 06/01/16 2320 06/02/16 0040 06/02/16 0516  BP: 94/60 81/50 93/57  95/50  Pulse: 68 67 70 70  Temp:    97.6 F (36.4 C)  TempSrc:    Oral  Resp:    20  Height:      Weight:      SpO2:    98%   Weight change:  No intake or output data in the 24 hours ending 06/02/16 1107   Exam: General: laying in bed, breathing comfortably in NAD HEENT: atraumatic, normocephalic CV: RRR, no murmurs, rubs or gallps, no JVD Pulm: CTA bilaterally, symmetric movement Abd: soft, non distended, non tender to palpation Back: no obvious abnormalities, symmetric and no tenderness to palpation Neuro: CN II-XII grossly intact  Lab Results: BMP Latest Ref Rng 06/02/2016 06/01/2016 05/31/2016  Glucose 65 - 99 mg/dL 80 08/01/2016) 353(I)  BUN 6 - 20 mg/dL 144(R) 15(Q) 12  Creatinine 0.44 - 1.00 mg/dL 00(Q) 6.76(P) 9.50(D  Sodium 135 - 145 mmol/L 133(L) 133(L) 137  Potassium 3.5 - 5.1 mmol/L 3.8 4.0 3.8  Chloride 101 - 111 mmol/L 102 101 105  CO2 22 - 32 mmol/L 23 25 26   Calcium 8.9 - 10.3 mg/dL 8.3(L) 8.4(L) 9.1   CBC Latest Ref Rng 06/02/2016 06/01/2016 05/31/2016  WBC 4.0 - 10.5 K/uL 12.5(H) 15.1(H) 14.0(H)  Hemoglobin 12.0 - 15.0 g/dL 08/02/2016) 11.4(L) 13.0  Hematocrit 36.0 - 46.0 %  29.5(L) 36.4 41.4  Platelets 150 - 400 K/uL 217 252 281     Micro Results: No results found for this or any previous visit (from the past 240 hour(s)). Studies/Results: Ct Angio Chest Pe W Or Wo Contrast  05/31/2016  CLINICAL DATA:  Chest pain and shortness of breath for 1 day EXAM: CT ANGIOGRAPHY CHEST WITH CONTRAST TECHNIQUE: Multidetector CT imaging of the chest was performed using the standard protocol during bolus administration of intravenous contrast. Multiplanar CT image reconstructions and MIPs were obtained to evaluate the vascular anatomy. CONTRAST:  80 mL Isovue 370 nonionic COMPARISON:  Chest radiograph May 31, 2016 FINDINGS: Cardiovascular: There is no demonstrable pulmonary embolus. Ascending thoracic aorta has a maximum transverse diameter of 4.6 x 4.6 cm. The entire thoracic aorta appears somewhat ectatic. Contrast bolus is not timed to optimize aortic visualization. No dissection is seen in the thoracic aorta to the extent that assessment for potential dissection can be made based on the contrast bolus. There is mild calcification in the proximal left common carotid and left subclavian arteries. Visualized great vessels otherwise appear unremarkable. There is no appreciable pericardial thickening. There is slight generalized cardiac enlargement. There are scattered foci of coronary artery calcification. Mediastinum/Nodes: Thyroid appears unremarkable. There is no appreciable thoracic adenopathy. Lungs/Pleura: There is patchy  atelectatic change in both lung bases as well as in the posterior segments of each upper lobe. There is peribronchial thickening in the lateral segment of the right middle lobe. There is also central peribronchial thickening in both lower lobes proximally. Upper Abdomen: In the visualized upper abdomen, there is atherosclerotic calcification in the upper abdominal aorta. Visualized upper abdominal structures otherwise are unremarkable. Musculoskeletal: There is anterior  wedging of the T11 vertebral body. There are no blastic or lytic bone lesions. Review of the MIP images confirms the above findings. IMPRESSION: No demonstrable pulmonary embolus. Ascending thoracic aorta has a maximum transverse diameter 4.6 x 4.6 cm. Ascending thoracic aortic aneurysm. Recommend semi-annual imaging followup by CTA or MRA and referral to cardiothoracic surgery if not already obtained. This recommendation follows 2010 ACCF/AHA/AATS/ACR/ASA/SCA/SCAI/SIR/STS/SVM Guidelines for the Diagnosis and Management of Patients With Thoracic Aortic Disease. Circulation. 2010; 121: A250-N397. There are atherosclerotic foci of calcification in the aorta. There are scattered foci of coronary artery calcification. Pericardium is not appreciably thickened. Areas of bronchitis and atelectasis.  No frank consolidation. No apparent adenopathy. Electronically Signed   By: Bretta Bang III M.D.   On: 05/31/2016 11:44   Mr Maxine Glenn Chest Wo Contrast  06/02/2016  CLINICAL DATA:  82 year old with ascending thoracic aortic aneurysm. History of severe chest pain that radiates to the back. Rule out aortic dissection. EXAM: MRA CHEST WITHOUT CONTRAST TECHNIQUE: Angiographic images of the chest were obtained using MRA technique without and with intravenous contrast. CONTRAST:  None. Contrast was not administered due to recently elevated creatinine level. COMPARISON:  Chest CTA neck 05/31/2016 FINDINGS: Enlargement of the ascending thoracic aorta measuring up to 4.4 cm. Although the study was performed without contrast, there is no evidence to suggest a dissection flap in the thoracic aorta. Proximal descending thoracic aorta measures up to 2.9 cm. Distal descending thoracic aorta measures 2.6 cm. The abdominal aorta near the hiatus measures 2.7 cm. There is a small amount of pleural fluid bilaterally which appears new. There is a small amount of pericardial fluid. Evidence for volume loss and consolidation at both lung bases.  There may be increased parenchymal disease in the right middle lobe. Incidental imaging of the upper abdomen is unremarkable. IMPRESSION: Fusiform aneurysm of the ascending thoracic aorta measuring up to 4.4 cm. No evidence for a thoracic aortic dissection. Development of small pleural effusions. Small amount of pericardial fluid. Volume loss and consolidation in the lower lobes and probably within the right middle lobe. Consider further characterization with chest radiography. Electronically Signed   By: Richarda Overlie M.D.   On: 06/02/2016 07:42   Nm Myocar Multi W/spect W/wall Motion / Ef  06/01/2016  CLINICAL DATA:  82 year old female with chest pain and hypertension band. Note ST changes on EKG. Minimal T-wave inversion. EXAM: MYOCARDIAL IMAGING WITH SPECT (REST AND PHARMACOLOGIC-STRESS) GATED LEFT VENTRICULAR WALL MOTION STUDY LEFT VENTRICULAR EJECTION FRACTION TECHNIQUE: Standard myocardial SPECT imaging was performed after resting intravenous injection of 10 mCi Tc-43m tetrofosmin. Subsequently, intravenous infusion of Lexiscan was performed under the supervision of the Cardiology staff. At peak effect of the drug, 30 mCi Tc-33m tetrofosmin was injected intravenously and standard myocardial SPECT imaging was performed. Quantitative gated imaging was also performed to evaluate left ventricular wall motion, and estimate left ventricular ejection fraction. COMPARISON:  None. FINDINGS: Perfusion: There is a small to moderate size region of mild decrease counts in the mid and basilar segment of lateral wall which improves from stress to rest. Wall Motion: Normal left ventricular  wall motion. No left ventricular dilation. Left Ventricular Ejection Fraction: 71 % End diastolic volume 47 ml End systolic volume 14 ml IMPRESSION: 1. Small is moderate region of reversible perfusion in the mid and basilar segment of lateral wall. 2. Normal left ventricular wall motion. 3. Left ventricular ejection fraction 71% 4. Non  invasive risk stratification*: Intermediate *2012 Appropriate Use Criteria for Coronary Revascularization Focused Update: J Am Coll Cardiol. 2012;59(9):857-881. http://content.dementiazones.com.aspx?articleid=[email or web address] These results will be called to the ordering clinician or representative by the Radiologist Assistant, and communication documented in the PACS or zVision Dashboard. Electronically Signed   By: Genevive Bi M.D.   On: 06/01/2016 14:04   Medications: I have reviewed the patient's current medications. Scheduled Meds: . calcium carbonate  1 tablet Oral Q breakfast  . cholecalciferol  1,000 Units Oral Daily  . etodolac  200 mg Oral TID  . famotidine  20 mg Oral Daily  . feeding supplement (ENSURE ENLIVE)  237 mL Oral BID BM  . levothyroxine  100 mcg Oral QAC breakfast  . sodium chloride  250 mL Intravenous Once  . sodium chloride flush  3 mL Intravenous Q12H  . sodium chloride flush  3 mL Intravenous Q12H  . traMADol  50 mg Oral TID   Continuous Infusions: . sodium chloride    . sodium chloride 1 mL/kg/hr (06/02/16 0646)  . heparin 1,300 Units/hr (06/02/16 1023)   PRN Meds:.sodium chloride, clonazePAM, ketorolac, nitroGLYCERIN, sodium chloride flush Assessment/Plan: Principal Problem:   Atypical chest pain:  Patients chest pain has mostly abated, and clinically she looks and feels much better this morning stating that she is no longer having sharp chest pain radiating to the neck and back or pleuritic chest pain.  With her continual symptoms of sharp chest pain radiating to the back, widened mediastinum and hypotension with systolics in the 80s (her baseline is in the 130s), we became concerned about an aortic dissection.  Her existing HTN, rheumatoid arthritis, and existing ascending thoracic aortic aneurysm put her at additional risk for this diagnosis. Her CT-A showed no evidence of a dissection, but the sensitivity of this test for ascending thoracic aortic dissections  can be <80%, so we ordered an MR-A yesterday which has a sensitivity of 95-100% to rule out this diagnosis.  This was read as negative for dissection this morning.  Cardiology performed a stress test yesterday which showed reversible perfusion in the mid/basilar lateral wall of her heart with an EF of 71%, and she agreed to undergo a catheterization today.  Her modified Geneva score for a PE is 4 (points given for age >66 and occasional tachycardia >95), which puts her at a low-moderate risk for a PE at 20-30%; her d-dimers were only 1.31 on admission, much lower than the age-adjusted d-dimer level of 810 that would concern [place] for a PE.   -Cardiology performing a left heart catheterization today, f/u results -Heparin monitored by pharmacy -nitroglycerin prn -pepcid 20mg  daily -aspirin  Ascending Thoracic Aortic Anuerysm:  CTA revealed a ascending aortic aneurysm, measuring 4.6cm X 4.6cm.  Radiology recommends semi-annual follow up with CTA or MRA and a referral to cardiothoracic surgery. -Refer to CTS when discharged -semi-annual f/u  Normocytic Anemia: Patients Hg decreased from 13 to 11.4 to 9.3 over the last two days, with a bump in BUN from 12 to 23 from yesterday to today.  Upon further questioning, daughter recalls that patients first bowel movement in the hospital was darker than usual which concerns for a possible  GI bleed.  She is at risk for this due to being anticoagulated on heparin in the hospital.  It may be partially dilutional with from our fluid boluses, but this would not explain the nearly 4 point drop in Hg.  She had no retroperitoneal tenderness, making [place] less concerned about a retroperitoneal bleed.   -obtain FOBT -continue monitoring CBC closely  Acute Kidney Injury:  Whitney Mayer's creatinine more than doubled from two days ago to yesterday, going from .64 to 1.39, and 1.13 this morning and her BUN increased from two days ago to yesterday from 12 to 23, to 27 this morning.   This is likely secondary to the multiple doses of contrast that she has received.  Since she will be receiving another dose of contrast with her heart cath today, we must make sure she is adequetely hydrated. -NS 250cc bolus -NS at 125 ml/hr -BMP in morning   Hypotension:  BP systolics remained in the 80s to 90s, with her baseline in the 130s.  She received two 250cc boluses, and her systolics remained in the 90s this morning.  -continue holding home losartan -fluid as above  Thyroid disease:  Patient's TSH was low yesterday, so we lowered her synthroid yesterday from to . -continue synthroid daily -repeat TSH in 6 weeks in clinic  Osteoporosis: On CXR, a vertebral fracture was appreciated which is diagnostic for osteoporosis. Start treating with calcium and vitamin D.  Possibly  Bisphosphonate on discharge.  -500 mg of calcium carbonate daily -1000u of Vitamin D daily   RA: Patient is treated with etodolac, tramodol and ibuprofen for pain but is not on a DMARD.  She is being followed by [place] rheumatologist in [place]  -600mg  etodolac 24 hour tablet daily -tramodol 50 mg TID  This is a Psychologist, occupational Note.  The care of the patient was discussed with Dr. Josem Kaufmann and the assessment and plan formulated with their assistance.  Please see their attached note for official documentation of the daily encounter.     Whitney Mayer, Med Student 06/02/2016, 11:07 AM

## 2016-06-02 NOTE — Care Management Obs Status (Signed)
MEDICARE OBSERVATION STATUS NOTIFICATION   Patient Details  Name: Whitney Mayer MRN: [id number] Date of Birth: 1932/10/29   Medicare Observation Status Notification Given:  Yes    Cherylann Parr, RN 06/02/2016, 10:25 AM

## 2016-06-02 NOTE — Progress Notes (Signed)
ANTICOAGULATION CONSULT NOTE  Pharmacy Consult for heparin Indication: chest pain/ACS  No Known Allergies  Patient Measurements: Height: 5\' 3"  (160 cm) Weight: 115 lb (52.164 kg) IBW/kg (Calculated) : 52.4 Heparin Dosing Weight: 52.2kg  Vital Signs: Temp: 97.6 F (36.4 C) (07/07 0516) Temp Source: Oral (07/07 0516) BP: 109/51 mmHg (07/07 1545) Pulse Rate: 57 (07/07 1545)  Labs:  Recent Labs  05/31/16 1010  05/31/16 1557 05/31/16 2053  06/01/16 0036 06/01/16 1147 06/01/16 1714 06/01/16 2110 06/02/16 0248 06/02/16 0654 06/02/16 0721  HGB 13.0  --   --   --   --  11.4*  --   --   --  9.3*  --   --   HCT 41.4  --   --   --   --  36.4  --   --   --  29.5*  --   --   PLT 281  --   --   --   --  252  --   --   --  217  --   --   LABPROT  --   --   --   --   --   --   --  17.4*  --   --   --   --   INR  --   --   --   --   --   --   --  1.42  --   --   --   --   HEPARINUNFRC  --   --   --   --   < > 0.13* 0.13*  --  <0.10*  --  0.16*  --   CREATININE 0.64  --   --   --   --   --   --  1.39*  --   --   --  1.13*  TROPONINI  --   < > 0.03* <0.03  --  0.03*  --   --   --   --   --   --   < > = values in this interval not displayed.  Estimated Creatinine Clearance: 32.2 mL/min (by C-G formula based on Cr of 1.13).  Assessment: 83 yof presented to the ED with CP. CT shows AAA w/o dissection. Pharmacy consulted to dose heparin. Hg 11.4, plt wnl. She is not on anticoagulation PTA.  She is s/p cath and was to restart heparin 8h after sheath removal (removed at 1230). However, patient was in AFib during cath and she was to be taken urgently for DCCV, however she converted to NSR after amiodarone load and DCCV deferred. NP would still like heparin started now  Goal of Therapy:  Heparin level 0.3-0.7 units/ml Monitor platelets by anticoagulation protocol: Yes   Plan:  - Restart heparin gtt at 1300/hr now - Heparin level in 8h - Daily HL and CBC - Will follow plans for long term  AC as well as possible DCCV  Yunior Jain D. Rainn Bullinger, PharmD, BCPS Clinical Pharmacist Pager: [phone] 06/02/2016 4:03 PM

## 2016-06-02 NOTE — Progress Notes (Signed)
Echocardiogram 2D Echocardiogram limited exam has been performed.  Dorothey Baseman 06/02/2016, 6:34 PM

## 2016-06-02 NOTE — H&P (View-Only) (Signed)
Nuclear study is intermediate risk with reversible perfusion in the mid and basilar segment of lateral wall. She is agreeable to left heart cath tomorrow. Will arrange and put in pre cath orders.   The risks and benefits of a cardiac catheterization including, but not limited to, death, stroke, MI, kidney damage and bleeding were discussed with the patient who indicates understanding and agrees to proceed.    

## 2016-06-02 NOTE — Progress Notes (Signed)
ANTICOAGULATION CONSULT NOTE  Pharmacy Consult for heparin--> Eliquis Indication: chest pain/ACS--> Afib  No Known Allergies  Patient Measurements: Height: 5\' 3"  (160 cm) Weight: 115 lb (52.164 kg) IBW/kg (Calculated) : 52.4 Heparin Dosing Weight: 52.2kg  Vital Signs: Temp: 98 F (36.7 C) (07/07 2000) Temp Source: Oral (07/07 2000) BP: 134/85 mmHg (07/07 2000) Pulse Rate: 56 (07/07 2000)  Labs:  Recent Labs  05/31/16 1010  05/31/16 1557 05/31/16 2053  06/01/16 0036 06/01/16 1147 06/01/16 1714 06/01/16 2110 06/02/16 0248 06/02/16 0654 06/02/16 0721 06/02/16 1828  HGB 13.0  --   --   --   --  11.4*  --   --   --  9.3*  --   --  11.3*  HCT 41.4  --   --   --   --  36.4  --   --   --  29.5*  --   --  35.9*  PLT 281  --   --   --   --  252  --   --   --  217  --   --  295  LABPROT  --   --   --   --   --   --   --  17.4*  --   --   --   --   --   INR  --   --   --   --   --   --   --  1.42  --   --   --   --   --   HEPARINUNFRC  --   --   --   --   < > 0.13* 0.13*  --  <0.10*  --  0.16*  --   --   CREATININE 0.64  --   --   --   --   --   --  1.39*  --   --   --  1.13*  --   TROPONINI  --   < > 0.03* <0.03  --  0.03*  --   --   --   --   --   --   --   < > = values in this interval not displayed.  Estimated Creatinine Clearance: 32.2 mL/min (by C-G formula based on Cr of 1.13).  Assessment: 42 yof presented to the ED with CP. CT shows AAA w/o dissection. Pharmacy consulted to dose heparin. Hg 11.4, plt wnl. She is not on anticoagulation PTA.  She is s/p cath and was to restart heparin 8h after sheath removal (removed at 1230). However, patient was in AFib during cath and she was to be taken urgently for DCCV, however she converted to NSR after amiodarone load and DCCV deferred. NP would still like heparin started now  Further into th evening, heparin switched to Eliquis for Afib    Plan:  Eliquis 2. 5 mg po BID Pharmacy to sign off and follow peripherally.  Thank  you 94, PharmD [phone] - 06/02/2016 9:16 PM

## 2016-06-02 NOTE — Progress Notes (Signed)
When patient arrived to ICU bed, she was noted to be in NSR, confirmed with EKG. No plans for DCCV at this time.

## 2016-06-02 NOTE — Progress Notes (Signed)
Patient developed rapid Afib during left heart cath. Came to check on patient and she was hypotensive with BP's of 73/56, 83/55, HR in 120's Afib. Patient says she "hurts all over", feels fatigued.  Diltiazem was not started due to hypotension. Discussed with Dr. Allyson Sabal, will start on IV Amiodarone with 150mg  load.

## 2016-06-02 NOTE — Progress Notes (Addendum)
Whitney CONSULT NOTE  Pharmacy Consult for heparin Indication: chest pain/ACS  No Known Allergies  Patient Measurements: Height: 5\' 3"  (160 cm) Weight: 115 lb (52.164 kg) IBW/kg (Calculated) : 52.4 Heparin Dosing Weight: 52.2kg  Vital Signs: Temp: 97.6 F (36.4 C) (07/07 Mayer) Temp Source: Oral (07/07 Mayer) BP: 95/50 mmHg (07/07 Mayer) Pulse Rate: 70 (07/07 Mayer)  Labs:  Recent Labs  05/31/16 1010  05/31/16 1557 05/31/16 2053  06/01/16 0036 06/01/16 1147 06/01/16 1714 06/01/16 2110 06/02/16 0248 06/02/16 0654 06/02/16 0721  HGB 13.0  --   --   --   --  11.4*  --   --   --  9.3*  --   --   HCT 41.4  --   --   --   --  36.4  --   --   --  29.5*  --   --   PLT 281  --   --   --   --  252  --   --   --  217  --   --   LABPROT  --   --   --   --   --   --   --  17.4*  --   --   --   --   INR  --   --   --   --   --   --   --  1.42  --   --   --   --   HEPARINUNFRC  --   --   --   --   < > 0.13* 0.13*  --  <0.10*  --  0.16*  --   CREATININE 0.64  --   --   --   --   --   --  1.39*  --   --   --  1.13*  TROPONINI  --   < > 0.03* <0.03  --  0.03*  --   --   --   --   --   --   < > = values in this interval not displayed.  Estimated Creatinine Clearance: 32.2 mL/min (by C-G formula based on Cr of 1.13).   Medical History: Past Medical History  Diagnosis Date  . Hypertension   . Thyroid disease   . Hypothyroidism   . Pneumonia ~ 1943  . Rheumatoid arthritis (HCC)     "all over; hands, feet, knees, joints" (05/31/2016)    Medications:  Infusions:  . sodium chloride 1 mL/kg/hr (06/02/16 0646)  . heparin 1,150 Units/hr (06/01/16 2226)    Assessment: 46 yof presented to the ED with CP. CT shows AAA w/o dissection. Pharmacy consulted to dose heparin. Hg 11.4, plt wnl. She is not on Whitney PTA. Plans noted for cath today -heparin level= 0.16  Goal of Therapy:  Heparin level 0.3-0.7 units/ml Monitor platelets by Whitney protocol: Yes    Plan:  - Increase heparin gtt to 1300/hr - Daily HL and CBC -Will follow plans post cath  94, Pharm D 06/02/2016 8:38 AM  Addendum -s/p cath with nonobstructive CAD and afib noted (CHADSVASC= 3) -restarting heparin 8 hrs post sheath removal -pt noted with afib   Plan -Restart heparin at 1300 units/hr 8 hrs after sheath removal (removed at 12:30pm) -Heparin level in 8 hours and daily wth CBC daily  08/03/2016, Pharm D 06/02/2016 1:16 PM

## 2016-06-03 DIAGNOSIS — I951 Orthostatic hypotension: Secondary | ICD-10-CM

## 2016-06-03 DIAGNOSIS — I700 Atherosclerosis of aorta: Secondary | ICD-10-CM

## 2016-06-03 DIAGNOSIS — I209 Angina pectoris, unspecified: Secondary | ICD-10-CM

## 2016-06-03 LAB — BASIC METABOLIC PANEL
ANION GAP: 3 — AB (ref 5–15)
BUN: 19 mg/dL (ref 6–20)
CALCIUM: 8 mg/dL — AB (ref 8.9–10.3)
CHLORIDE: 110 mmol/L (ref 101–111)
CO2: 23 mmol/L (ref 22–32)
CREATININE: 0.83 mg/dL (ref 0.44–1.00)
GFR calc non Af Amer: >60 mL/min (ref >60)
Glucose, Bld: 94 mg/dL (ref 65–99)
Potassium: 4.3 mmol/L (ref 3.5–5.1)
SODIUM: 136 mmol/L (ref 135–145)

## 2016-06-03 LAB — CBC
HEMATOCRIT: 30.1 % — AB (ref 36.0–46.0)
HEMOGLOBIN: 9.3 g/dL — AB (ref 12.0–15.0)
MCH: 25.7 pg — ABNORMAL LOW (ref 26.0–34.0)
MCHC: 30.9 g/dL (ref 30.0–36.0)
MCV: 83.1 fL (ref 78.0–100.0)
Platelets: 206 10*3/uL (ref 150–400)
RBC: 3.62 MIL/uL — ABNORMAL LOW (ref 3.87–5.11)
RDW: 16 % — AB (ref 11.5–15.5)
WBC: 13.1 10*3/uL — AB (ref 4.0–10.5)

## 2016-06-03 LAB — ECHOCARDIOGRAM LIMITED
HEIGHTINCHES: 63 in
Weight: 1840 oz

## 2016-06-03 MED ORDER — SULFAMETHOXAZOLE-TRIMETHOPRIM 800-160 MG PO TABS
1.0000 | ORAL_TABLET | Freq: Two times a day (BID) | ORAL | Status: DC
  Administered 2016-06-03 – 2016-06-04 (×2): 1 via ORAL
  Filled 2016-06-03 (×3): qty 1

## 2016-06-03 MED ORDER — DOCUSATE SODIUM 100 MG PO CAPS
100.0000 mg | ORAL_CAPSULE | Freq: Every day | ORAL | Status: DC | PRN
  Administered 2016-06-03 – 2016-06-04 (×2): 100 mg via ORAL
  Filled 2016-06-03 (×2): qty 1

## 2016-06-03 NOTE — Progress Notes (Signed)
Subjective: Whitney Mayer is an 82 yo female with a PMHx of severe RA, HTN, DM, and thyroid dz who presented to the ED 3 days ago with sharp substernal chest pain radiating to her back and neck and pleuritic chest pain.  Yesterday, patient went into atrial fibrillation during a cardiac catheterization procedure, and was given amiodarone, at which point she was transferred to the ICU for possible cardioversion.  She spontaneously resumed normal rhythm so was not cardioverted, but was started on levophed for low blood pressure.  Patient is feeling much better this morning, is alert and oriented X3.  She is not complaining of any chest pain or difficulty breathing, nausea/vomiting or diaphoresis.  She still has not had a bowel movement, but does not feel constipated or full.  She feels ready to be discharged.    Objective: Vital signs in last 24 hours: Filed Vitals:   06/03/16 0630 06/03/16 0700 06/03/16 0742 06/03/16 0800  BP: 102/60 107/57  113/58  Pulse: 56 58  59  Temp:   97.5 F (36.4 C)   TempSrc:   Oral   Resp: 17 19  15   Height:      Weight:      SpO2: 100% 100%  100%   Weight change:   Intake/Output Summary (Last 24 hours) at 06/03/16 0932 Last data filed at 06/03/16 0800  Gross per 24 hour  Intake 1450.1 ml  Output    900 ml  Net  550.1 ml   General:  Alert and oriented X3, laying bed comfortably, conversing and smiling, no difficulty breathing Pulm: CTA BL, symmetric movement CV: RRR, no murmurs rubs or gallops Back: No obvious abnormalities, no tenderness to palpation in the retroperitoneal region Abd: soft, nondistended non tender to palp Ext: no swelling Neuro: CN II-XII grossly intact   Lab Results: BMP Latest Ref Rng 06/03/2016 06/02/2016 06/01/2016  Glucose 65 - 99 mg/dL 94 80 08/02/2016)  BUN 6 - 20 mg/dL 19 315(V) 76(H)  Creatinine 0.44 - 1.00 mg/dL 60(V 3.71) 0.62(I)  Sodium 135 - 145 mmol/L 136 133(L) 133(L)  Potassium 3.5 - 5.1 mmol/L 4.3 3.8 4.0  Chloride 101 - 111  mmol/L 110 102 101  CO2 22 - 32 mmol/L 23 23 25   Calcium 8.9 - 10.3 mg/dL 8.0(L) 8.3(L) 8.4(L)      Micro Results: Recent Results (from the past 240 hour(s))  MRSA PCR Screening     Status: None   Collection Time: 06/02/16  3:58 PM  Result Value Ref Range Status   MRSA by PCR NEGATIVE NEGATIVE Final    Comment:        The GeneXpert MRSA Assay (FDA approved for NASAL specimens only), is one component of a comprehensive MRSA colonization surveillance program. It is not intended to diagnose MRSA infection nor to guide or monitor treatment for MRSA infections.    Studies/Results: Ct Abdomen Pelvis Wo Contrast  06/02/2016  CLINICAL DATA:  Retroperitoneal bleed. Hypertension. Status post cardiac catheterization same day EXAM: CT ABDOMEN AND PELVIS WITHOUT CONTRAST TECHNIQUE: Multidetector CT imaging of the abdomen and pelvis was performed following the standard protocol without IV contrast. COMPARISON:  CT chest 05/31/2016 FINDINGS: Lower chest: Bibasilar atelectasis.  Small pericardial effusion. Hepatobiliary: No focal hepatic lesion. Gallbladder distended to 5 cm. Pancreas: Pancreas is normal. No ductal dilatation. No pancreatic inflammation. Spleen: Normal spleen Adrenals/urinary tract: Adrenal glands and kidneys are normal. The ureters and bladder normal. Stomach/Bowel: Stomach, small bowel, appendix, and cecum are normal. The colon and rectosigmoid  colon are normal. Vascular/Lymphatic: Abdominal aorta is normal caliber with atherosclerotic calcification. There is no retroperitoneal or periportal lymphadenopathy. No pelvic lymphadenopathy. No evidence of retroperitoneal hematoma. No evidence of aneurysm or pseudoaneurysm in the groins. Reproductive: Uterus normal. Other: Small amount free fluid post to the uterus. Musculoskeletal: No aggressive osseous lesion. IMPRESSION: 1. No evidence of retroperitoneal hematoma or groin hematoma. 2.  Atherosclerotic calcification of the abdominal aorta. 3.  Bibasilar atelectasis and small pericardial effusion. Electronically Signed   By: Genevive Bi M.D.   On: 06/02/2016 18:11   Mr Maxine Glenn Chest Wo Contrast  06/02/2016  CLINICAL DATA:  82 year old with ascending thoracic aortic aneurysm. History of severe chest pain that radiates to the back. Rule out aortic dissection. EXAM: MRA CHEST WITHOUT CONTRAST TECHNIQUE: Angiographic images of the chest were obtained using MRA technique without and with intravenous contrast. CONTRAST:  None. Contrast was not administered due to recently elevated creatinine level. COMPARISON:  Chest CTA neck 05/31/2016 FINDINGS: Enlargement of the ascending thoracic aorta measuring up to 4.4 cm. Although the study was performed without contrast, there is no evidence to suggest a dissection flap in the thoracic aorta. Proximal descending thoracic aorta measures up to 2.9 cm. Distal descending thoracic aorta measures 2.6 cm. The abdominal aorta near the hiatus measures 2.7 cm. There is a small amount of pleural fluid bilaterally which appears new. There is a small amount of pericardial fluid. Evidence for volume loss and consolidation at both lung bases. There may be increased parenchymal disease in the right middle lobe. Incidental imaging of the upper abdomen is unremarkable. IMPRESSION: Fusiform aneurysm of the ascending thoracic aorta measuring up to 4.4 cm. No evidence for a thoracic aortic dissection. Development of small pleural effusions. Small amount of pericardial fluid. Volume loss and consolidation in the lower lobes and probably within the right middle lobe. Consider further characterization with chest radiography. Electronically Signed   By: Richarda Overlie M.D.   On: 06/02/2016 07:42   Nm Myocar Multi W/spect W/wall Motion / Ef  06/01/2016  CLINICAL DATA:  82 year old female with chest pain and hypertension band. Note ST changes on EKG. Minimal T-wave inversion. EXAM: MYOCARDIAL IMAGING WITH SPECT (REST AND PHARMACOLOGIC-STRESS)  GATED LEFT VENTRICULAR WALL MOTION STUDY LEFT VENTRICULAR EJECTION FRACTION TECHNIQUE: Standard myocardial SPECT imaging was performed after resting intravenous injection of 10 mCi Tc-25m tetrofosmin. Subsequently, intravenous infusion of Lexiscan was performed under the supervision of the Cardiology staff. At peak effect of the drug, 30 mCi Tc-31m tetrofosmin was injected intravenously and standard myocardial SPECT imaging was performed. Quantitative gated imaging was also performed to evaluate left ventricular wall motion, and estimate left ventricular ejection fraction. COMPARISON:  None. FINDINGS: Perfusion: There is a small to moderate size region of mild decrease counts in the mid and basilar segment of lateral wall which improves from stress to rest. Wall Motion: Normal left ventricular wall motion. No left ventricular dilation. Left Ventricular Ejection Fraction: 71 % End diastolic volume 47 ml End systolic volume 14 ml IMPRESSION: 1. Small is moderate region of reversible perfusion in the mid and basilar segment of lateral wall. 2. Normal left ventricular wall motion. 3. Left ventricular ejection fraction 71% 4. Non invasive risk stratification*: Intermediate *2012 Appropriate Use Criteria for Coronary Revascularization Focused Update: J Am Coll Cardiol. 2012;59(9):857-881. http://content.dementiazones.com.aspx?articleid=[email or web address] These results will be called to the ordering clinician or representative by the Radiologist Assistant, and communication documented in the PACS or zVision Dashboard. Electronically Signed   By: Loura Halt.D.  On: 06/01/2016 14:04   Medications: I have reviewed the patient's current medications. Scheduled Meds: . apixaban  2.5 mg Oral BID  . calcium carbonate  1 tablet Oral Q breakfast  . cholecalciferol  1,000 Units Oral Daily  . diltiazem  10 mg Intravenous Once  . etodolac  200 mg Oral TID  . famotidine  20 mg Oral Daily  . feeding supplement (ENSURE  ENLIVE)  237 mL Oral BID BM  . levothyroxine  100 mcg Oral QAC breakfast  . sodium chloride flush  3 mL Intravenous Q12H  . sodium chloride flush  3 mL Intravenous Q12H  . traMADol  50 mg Oral TID   Continuous Infusions: . amiodarone Stopped (06/02/16 2130)  . diltiazem (CARDIZEM) infusion    . norepinephrine (LEVOPHED) Adult infusion Stopped (06/03/16 0230)   PRN Meds:.sodium chloride, acetaminophen, clonazePAM, nitroGLYCERIN, ondansetron (ZOFRAN) IV, sodium chloride flush Assessment/Plan:  Atypical chest pain: Patient is no longer complaining of any chest pain, and is able to take deep breaths without any pain.  We were initially concerned about a possible dissection because of the sharp pain radiating to her back, hypotension and widened mediastinum but we ruled this out with a MRA.  Cardiology's echo had showed some reversible ischemia in the mid/basilar lateral wall of her heart, so they performed a catheterization yesterday which showed patent coronary arteries with stenosis between 25-45% and normal LV function. -chest pain resolved  Hypotension:  Patient's BP has improved on the levophed that cardiology started in the ICU.  Her baseline before hospitalization was systolics in the 120-130s, and during hospitalization she dropped to the 60-80s.  Levophed has stabilzed her BP to systolics 105-157 and diastolic 53-59.  -continue holding home losartan -continue maintenance .9%NS at 125cc/hr -Levophed has been d/ced  Atrial fibrillation with RVR:  During cardiology's catheterization procedure, patient developed atrial fibrillation with rapid ventricular response and was given 10mg  diltiazam and metroprolol and was trasferred to the ICU for cardioversion.  Patient resumed normal rhythm spontaneously and did not need to be cardioverted. -2.5 mg apixiban BID to ppx for embolus  Normocytic anemia:  Patient's Hg was downtrending from 13.0-->11.4--> 9.3 on the first few days of admission, and  her most recent Hg were 11.3 and 9.3  We were worried about a potential bleed, since she's on heparin, so we ordered an FOBT (not completed since patient still hasn't had a BM), and a CT to assess for a retroperitoneal bleed.  The CT was negative.  We are less worried about this now that she is stable. Urinalysis did not show any RBCs in the urine.  AKI: Ms. Grafton's creatinine bumped during admission likely 2/2 multiple doses of contrast and hypotension.  Her BMP this morning showed a creatinine and BUN within normal limits.  -resolved  Osteoporosis: On CXR, a vertebral fracture was appreciated which is diagnostic for osteoporosis. Treating with calcium and vitamin D. Possibly Bisphosphonate on discharge.  -500 mg of calcium carbonate daily -1000u of Vitamin D daily   RA: Patient is treated with etodolac, tramodol and ibuprofen for pain but is not on a DMARD. She is being followed by her rheumatologist in .  -600mg  etodolac 24 hour tablet daily -tramodol 50 mg TID -650mg  tylenol q4h   Hashimoto Thyroiditis: Patient's TSH low on admission, lowered synthroid dose from [place] to -continue of synthroid daily  Dispo: patient can be transferred to floor now that she is not on pressors.   This is a  Student Note.  The care of the patient was discussed with Dr. Beckie Salts and the assessment and plan formulated with their assistance.  Please see their attached note for official documentation of the daily encounter.   LOS: 1 day   Jannet Askew, Med Student 06/03/2016, 9:32 AM

## 2016-06-03 NOTE — Progress Notes (Signed)
  Date: 06/03/2016  Patient name: Whitney Mayer  Medical record number: [id number]  Date of birth: July 16, 1932   This patient's plan of care was discussed with the house staff. Please see their note for complete details. I concur with their findings.   Burns Spain, MD 06/03/2016, 2:58 PM

## 2016-06-03 NOTE — Progress Notes (Signed)
Subjective: Transferred to the ICU yesterday evening after going into AFib with RVR post cath and Cards had wanted to perform cardioversion. However, she spontaneously converted to NSR. BPs low in the 60s systolic with confusion and she was placed on Levophed. Hgb had dropped 4 points from admission yesterday. CT abd/pelvis ordered to rule out retroperitoneal bleed as she had been on heparin gtt, but this was negative for any bleeding.   She is much improved this morning. She is off the Levophed and BPs stable in the 110s systolic. She denies any chest pain and SOB. Reports feeling better overall.   Objective: Vital signs in last 24 hours: Filed Vitals:   06/03/16 0630 06/03/16 0700 06/03/16 0742 06/03/16 0800  BP: 102/60 107/57  113/58  Pulse: 56 58  59  Temp:   97.5 F (36.4 C)   TempSrc:   Oral   Resp: 17 19  15   Height:      Weight:      SpO2: 100% 100%  100%   Physical Exam General: elderly woman sitting up in bed, NAD HEENT: [place]/AT, EOMI, sclera anicteric, mucus membranes moist CV: RRR, no m/g/r. No tenderness to palpation of chest.  Pulm: CTA bilaterally, breaths non-labored Abd: BS+, soft, non-tender Ext: warm, no peripheral edema. 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  Neuro: alert and oriented x 3  Assessment/Plan:  Atypical Chest Pain: MRA negative for aortic dissection. Echo on 7/6 showed a normal EF. Myoview was a false positive as cardiac cath revealed no obstructive disease. She went into Afib with RVR during the cath procedure and spontaneously converted to NSR. She became hypotensive and required Levophed last night, but BPs are now stable off of Levophed. Her chest pain was likely musculoskeletal in nature vs acid reflux as she had some tenderness to palpation on admission and burping/reflex symptoms. Her chest pain has completely resolved. Heparin has been stopped by Cardiology and she is now on Eliquis. Repeat Echo ordered by cards given acute  decompensation last night.  - Transfer to tele bed if ok by Cards. We can resume care.  - Continue Eliquis - f/u repeat echo - Continue Pepcid 20 mg daily - Cardiology following, appreciate recommendations  AFib with RVR: Spontaneously converted to NSR. She is still in NSR this morning. Will continue Eliquis for potential paroxysmal AFib.  - Continue Eliquis  Ascending Thoracic Aortic Aneurysm: CTA Chest showed an ascending thoracic aorta with a maximum transverse diameter of 4.6 x 4.6 cm. Radiology recommended semi-annual imaging followup by CTA or MRA and referral to cardiothoracic surgery if not already obtained.  - Referral to CTS on discharge - Semi-Annual follow up  Normocytic Anemia: Hgb 11.3 last night and then 9.3 this morning. She has no evidence of bleeding on exam. CT abd/pelvis was negative for any retroperitoneal bleeding. At this point, I think her hemoglobin fluctuating in the 9-11 range is related to dilution from IVFs. Notified by nurse this morning that patient is a Jehovah's Witness and does not want any blood products. Will continue to monitor closely.  - Obtain FOBT   AKI: Resolved. Her Cr is back to her baseline. Her AKI was likely related to contrast nephropathy.  HTN: She became hypotensive in the 60s last night after going into AFib with RVR during the cardiac cath procedure. She required Levophed to support her BP last night, but has been off of the drip for 10 hours now. BPs stable in the 110s.  - Continue  to hold home BP meds  Rheumatoid Arthritis: Patient reports being on Etodolac at home but no DMARD. - Would refer to outpatient Rheum versus follow up with Rheum in [place]  Osteoporosis: Vertebral fracture seen on CXR diagnostic of osteoporosis. Started on calcium and vitamin D. - Consider starting bisphosphonate on discharge   Hx Hashimoto's Thyroiditis: Currently on levothyroxine 112 mcg daily at home. TSH was low at 0.103.  - Continue synthroid 100  mcg daily - Repeat TSH in 6 weeks as outpatient  Diet: Heart healthy DVT PPPx: Eliquis Dispo: Anticipated discharge in approximately 1-2 day(s).   LOS: 1 day   Rich Number, MD, MPH Internal Medicine Resident, PGY-II Pager: [phone] 06/03/2016 9:43 AM

## 2016-06-03 NOTE — Progress Notes (Addendum)
PATIENT ID: Whitney Mayer is an 52F with paroxysmal atrial fibrillation, Rheumatoid arthritis, and moderate CAD here with chest pain and intermediate risk stress test. Found to have moderate CAD on cath.  Subsequently developed hypotension requiring levophed and atrial fibrillation with RVR.   SUBJECTIVE:  Feeling much better this AM.  Denies chest pain or shortness of breath.   PHYSICAL EXAM Filed Vitals:   06/03/16 0800 06/03/16 1000 06/03/16 1150 06/03/16 1200  BP: 113/58   110/64  Pulse: 59 67  66  Temp:   97.9 F (36.6 C)   TempSrc:   Oral   Resp: 15 18  17   Height:      Weight:      SpO2: 100% 96%  95%   General:  Well-appearing.  Neck: No JVD Lungs:  CTAB. No crackles, rhonchi or wheezes.  Heart:  RRR.  No m/r/g.  Normal S1/S2 Abdomen:  Soft, NT, ND.  +BS Extremities:  WWP.   LABS: Lab Results  Component Value Date   TROPONINI 0.03* 06/01/2016   Results for orders placed or performed during the hospital encounter of 05/31/16 (from the past 24 hour(s))  MRSA PCR Screening     Status: None   Collection Time: 06/02/16  3:58 PM  Result Value Ref Range   MRSA by PCR NEGATIVE NEGATIVE  CBC     Status: Abnormal   Collection Time: 06/02/16  6:28 PM  Result Value Ref Range   WBC 19.3 (H) 4.0 - 10.5 K/uL   RBC 4.25 3.87 - 5.11 MIL/uL   Hemoglobin 11.3 (L) 12.0 - 15.0 g/dL   HCT 08/03/16 (L) 26.2 - 03.5 %   MCV 84.5 78.0 - 100.0 fL   MCH 26.6 26.0 - 34.0 pg   MCHC 31.5 30.0 - 36.0 g/dL   RDW 59.7 (H) 41.6 - 38.4 %   Platelets 295 150 - 400 K/uL  Urinalysis, Routine w reflex microscopic (not at [facility])     Status: Abnormal   Collection Time: 06/02/16  8:30 PM  Result Value Ref Range   Color, Urine YELLOW YELLOW   APPearance CLEAR CLEAR   Specific Gravity, Urine 1.020 1.005 - 1.030   pH 5.0 5.0 - 8.0   Glucose, UA NEGATIVE NEGATIVE mg/dL   Hgb urine dipstick NEGATIVE NEGATIVE   Bilirubin Urine LARGE (A) NEGATIVE   Ketones, ur 15 (A) NEGATIVE mg/dL   Protein, ur 30 (A)  NEGATIVE mg/dL   Nitrite NEGATIVE NEGATIVE   Leukocytes, UA SMALL (A) NEGATIVE  Urine microscopic-add on     Status: Abnormal   Collection Time: 06/02/16  8:30 PM  Result Value Ref Range   Squamous Epithelial / LPF 0-5 (A) NONE SEEN   WBC, UA 6-30 0 - 5 WBC/hpf   RBC / HPF NONE SEEN 0 - 5 RBC/hpf   Bacteria, UA FEW (A) NONE SEEN   Casts GRANULAR CAST (A) NEGATIVE  CBC     Status: Abnormal   Collection Time: 06/03/16  7:02 AM  Result Value Ref Range   WBC 13.1 (H) 4.0 - 10.5 K/uL   RBC 3.62 (L) 3.87 - 5.11 MIL/uL   Hemoglobin 9.3 (L) 12.0 - 15.0 g/dL   HCT 08/04/16 (L) 46.8 - 03.2 %   MCV 83.1 78.0 - 100.0 fL   MCH 25.7 (L) 26.0 - 34.0 pg   MCHC 30.9 30.0 - 36.0 g/dL   RDW 12.2 (H) 48.2 - 50.0 %   Platelets 206 150 - 400 K/uL  Basic metabolic  panel     Status: Abnormal   Collection Time: 06/03/16  7:02 AM  Result Value Ref Range   Sodium 136 135 - 145 mmol/L   Potassium 4.3 3.5 - 5.1 mmol/L   Chloride 110 101 - 111 mmol/L   CO2 23 22 - 32 mmol/L   Glucose, Bld 94 65 - 99 mg/dL   BUN 19 6 - 20 mg/dL   Creatinine, Ser 3.88 0.44 - 1.00 mg/dL   Calcium 8.0 (L) 8.9 - 10.3 mg/dL   GFR calc non Af Amer >60 >60 mL/min   GFR calc Af Amer >60 >60 mL/min   Anion gap 3 (L) 5 - 15    Intake/Output Summary (Last 24 hours) at 06/03/16 1247 Last data filed at 06/03/16 1200  Gross per 24 hour  Intake 1690.1 ml  Output   1250 ml  Net  440.1 ml   Telemetry :  Atrial fibrillation converted to sinus rhythm 6pm on 06/02/16.  Now PVCs and sinus bradycardia.   Echo 06/02/16:  Study Conclusions  - Left ventricle: The cavity size was normal. There was moderate  focal basal hypertrophy. Systolic function was normal. The  estimated ejection fraction was in the range of 60% to 65%. Wall  motion was normal; there were no regional wall motion  abnormalities. - Aortic valve: There was mild regurgitation. - Mitral valve: There was trivial regurgitation. - Right ventricle: Systolic function was  mildly reduced. - Pulmonary arteries: PA peak pressure: 37 mm Hg (S). - Pericardium, extracardiac: A small, free-flowing pericardial  effusion was identified circumferential to the heart. The fluid  had no internal echoes.  Impressions:  - The right ventricular systolic pressure was increased consistent  with mild pulmonary hypertension.  ASSESSMENT AND PLAN:  Principal Problem:   Chest pain Active Problems:   Hashimoto's thyroiditis   Thoracic ascending aortic aneurysm (HCC)   RA (rheumatoid arthritis) (HCC)   Coronary artery calcification   Aortic calcification (HCC)   Pain in the chest   # Hypotension: It is unclear what caused Whitney Mayer' hypotension yesterday.  Echo reveals normal systolic function and mildly elevated pulmonary pressures.  Initially it was thought that she may have been bleeding but repeat H/H stable and no evidence of bleeding on CT.  BP now stable off levophed.  She does have a leukocytosis and mild UTI.    # UTI: Will start Bactrim DS one tab bid x10 days.  # Moderate CAD: Aspirin stopped in favor of Eliquis for atrial fibrillation.  Holding antihypertensives given hypotension. She has been intermittently bradycardic since converting back to sinus rhythm.  She may be able to start a low dose beta blocker once hemodynamically stable (for both AF and CAD).  LDL 76.  Given her age we will not start a statin.   # Paroxysmal atrial fibrillation: Converted back to sinus rhythm after amiodarone and IV diltiazem.  Continue Eliquis. This patients CHA2DS2-VASc Score and unadjusted Ischemic Stroke Rate (% per year) is equal to 4.8 % stroke rate/year from a score of 4  Above score calculated as 1 point each if present [CHF, HTN, DM, Vascular=MI/PAD/Aortic Plaque, Age if 65-74, or Female] Above score calculated as 2 points each if present [Age > 75, or Stroke/TIA/TE]  # Thoracic aortic aneurysm: 4.6cm x4.6cm.  Q6 month imaging recommended.     Renly Guedes C. Duke Salvia,  MD, [facility] 06/03/2016 12:47 PM

## 2016-06-03 NOTE — Progress Notes (Signed)
Report called to receiving Rn [id number]. Will transfer with Rn in the bed. Patient with no complaints at the current time.

## 2016-06-04 MED ORDER — ENSURE ENLIVE PO LIQD: 237.0000 mL | Freq: Two times a day (BID) | ORAL | Status: AC

## 2016-06-04 MED ORDER — ALENDRONATE SODIUM 70 MG PO TABS: 70.0000 mg | ORAL_TABLET | ORAL | Status: DC

## 2016-06-04 MED ORDER — LOSARTAN POTASSIUM 50 MG PO TABS: 50.0000 mg | ORAL_TABLET | Freq: Every day | ORAL | Status: DC

## 2016-06-04 MED ORDER — LOSARTAN POTASSIUM 100 MG PO TABS: 50.0000 mg | ORAL_TABLET | Freq: Every day | ORAL | Status: DC

## 2016-06-04 MED ORDER — CHOLECALCIFEROL 25 MCG (1000 UT) PO TABS
1000.0000 [IU] | ORAL_TABLET | Freq: Every day | ORAL | Status: DC
Start: 2016-06-04 — End: 2016-10-30

## 2016-06-04 MED ORDER — POLYETHYLENE GLYCOL 3350 17 G PO PACK
17.0000 g | PACK | Freq: Every day | ORAL | Status: DC
  Administered 2016-06-04: 17 g via ORAL
  Filled 2016-06-04: qty 1

## 2016-06-04 MED ORDER — NITROGLYCERIN 0.4 MG SL SUBL: 0.4000 mg | SUBLINGUAL_TABLET | SUBLINGUAL | Status: DC | PRN

## 2016-06-04 MED ORDER — DOCUSATE SODIUM 100 MG PO CAPS: 100.0000 mg | ORAL_CAPSULE | Freq: Every day | ORAL | Status: DC | PRN

## 2016-06-04 MED ORDER — LEVOTHYROXINE SODIUM 100 MCG PO TABS: 100.0000 ug | ORAL_TABLET | Freq: Every day | ORAL | Status: DC

## 2016-06-04 MED ORDER — APIXABAN 2.5 MG PO TABS: 2.5000 mg | ORAL_TABLET | Freq: Two times a day (BID) | ORAL | Status: DC

## 2016-06-04 MED ORDER — FAMOTIDINE 20 MG PO TABS: 20.0000 mg | ORAL_TABLET | Freq: Every day | ORAL | Status: DC

## 2016-06-04 MED ORDER — CALCIUM CARBONATE 1250 (500 CA) MG PO TABS: 1.0000 | ORAL_TABLET | Freq: Every day | ORAL | Status: DC

## 2016-06-04 NOTE — Progress Notes (Signed)
RN spoke with Whitney Mayer on call with teaching service pertaining to RN auscultating fine crackles in patient's bilateral bases.  Patient denies shortness of breath and no respiratory distress noted per RN.  Patient oxygen saturation about 94% on room air during this shift.  RN did notice when patient transferred to and from beside commode oxygen saturation did drop into mid to high 80's, saturation recovered quickly once patient resting back in bed.  Whitney Mayer updated of all information in this note.  No new orders received.

## 2016-06-04 NOTE — Discharge Summary (Signed)
Name: Whitney Mayer MRN: [id number] DOB: 1932/09/02 82 y.o. PCP: Pcp Not In System  Date of Admission: 05/31/2016  9:42 AM Date of Discharge: 06/04/2016 Attending Physician: Doneen Poisson, MD  Discharge Diagnosis: Principal Problem Moderate CAD with Atypical Chest Pain Active Problems Paroxysmal Atrial Fibrillation Asymptomatic Bacteruria Ascending Thoracic Aortic Aneurysm Normocytic Anemia Hypotension with Hx HTN Rheumatoid Arthritis Osteoporosis Hx Hashimoto's Thyroiditis    Discharge Medications:   Medication List    STOP taking these medications        aspirin 325 MG tablet      TAKE these medications        alendronate 70 MG tablet  Commonly known as:  FOSAMAX  Take 1 tablet (70 mg total) by mouth once a week. Take with a full glass of water on an empty stomach.     apixaban 2.5 MG Tabs tablet  Commonly known as:  ELIQUIS  Take 1 tablet (2.5 mg total) by mouth 2 (two) times daily.     calcium carbonate 1250 (500 Ca) MG tablet  Commonly known as:  OS-CAL - dosed in mg of elemental calcium  Take 1 tablet (500 mg of elemental calcium total) by mouth daily with breakfast.     Cholecalciferol 1000 units tablet  Take 1 tablet (1,000 Units total) by mouth daily.     clonazePAM 0.5 MG tablet  Commonly known as:  KLONOPIN  Take 0.5 mg by mouth 2 (two) times daily as needed for anxiety.     docusate sodium 100 MG capsule  Commonly known as:  COLACE  Take 1 capsule (100 mg total) by mouth daily as needed for mild constipation.     etodolac 600 MG 24 hr tablet  Commonly known as:  LODINE XL  Take 300 mg by mouth 3 (three) times daily.     famotidine 20 MG tablet  Commonly known as:  PEPCID  Take 1 tablet (20 mg total) by mouth daily.     feeding supplement (ENSURE ENLIVE) Liqd  Take 237 mLs by mouth 2 (two) times daily between meals.     ibuprofen 200 MG tablet  Commonly known as:  ADVIL,MOTRIN  Take 400 mg by mouth every 6 (six) hours as needed for  moderate pain.     levothyroxine 100 MCG tablet  Commonly known as:  SYNTHROID, LEVOTHROID  Take 1 tablet (100 mcg total) by mouth daily before breakfast.     losartan 100 MG tablet  Commonly known as:  COZAAR  Take 0.5 tablets (50 mg total) by mouth daily.     nitroGLYCERIN 0.4 MG SL tablet  Commonly known as:  NITROSTAT  Place 1 tablet (0.4 mg total) under the tongue every 5 (five) minutes as needed for chest pain.     traMADol 50 MG tablet  Commonly known as:  ULTRAM  Take 50 mg by mouth 3 (three) times daily.        Disposition and follow-up:   Whitney Mayer was discharged from [facility] in Good condition.  At the hospital follow up visit please address:  1.  Chest pain: Has she had any additional chest pain? Can start low dose beta blocker if HR allows. AFib: Occurred during cardiac cath. Started on Eliquis 2.5 mg BID. Cards thinks will only need for 3 months. AAA: Will need referral to Cardiothoracic surgery- advise her to do this in [place] if she still plans on going back.  HTN: Home BP meds held during admission but BP  improved to 140s systolic on discharge. Discharged on Losartan 50 mg daily. Will need BP recheck and determine if other prior home BP meds need to be restarted.  Osteoporosis: Started on Alendronate 70 mg Qweekly. Ensure she has started this medication. Thyroid disease: Remind patient she will need repeat TSH checked when she sees her PCP in [place]  2.  Labs / imaging needed at time of follow-up: TSH- will need in mid-August, bi-annual CTA chest or MRA for her AAA  3.  Pending labs/ test needing follow-up: None   Follow-up Appointments: [facility]- July 13th  Hospital Course by problem list:   Moderate CAD with Atypical Chest Pain: She presented with a 1 day history of sudden onset sharp substernal chest pain that radiated to her neck and back. She did have some tenderness to palpation of chest wall on exam as well. Initially,  PE was a concern with her symptoms and elevated d-dimer of 1.31. CTA chest was negative for PE but did show an ascending thoracic AAA. There was concern for an aortic dissection given her widened mediastinum on CXR, but subsequent MRA was negative for dissection. There was also concern for ACS given inferior TWI noted on EKG. Her troponin leak was mild with levels of 0.04>0.03>less than 0.03>0.03. Her echocardiogram showed a normal EF of 55-60% and no wall motion abnormalities. Her Myoview was intermediate risk with a small to moderate region of reversible perfusion in the mid and basilar segment of the lateral wall. However, this was discovered to be a false positive during cardiac cath as she had widely patent coronary arteries with non-obstructive three-vessel atherosclerosis. Her ostial LAD had 40-50% stenosis. She then developed AFib with RVR during the procedure as noted below. By time of discharge her chest pain had completely resolved. Cardiology recommended to consider a low dose beta blocker for her moderate CAD if her BP and HR could tolerate it. This was not started upon discharge and can be considered by her PCP. A statin was not started due to her age. She was recommended to stop ASA as she is on Eliquis for her pAFib.   Paroxysmal Atrial Fibrillation: She went into AFib with RVR during her cardiac cath procedure. She was started on Cardizem and Amiodarone and spontaneously converted to NSR. She was started on Eliquis given her CHADS-VASc score was 4. Cardiology believes she will only require Eliquis for 3 months if she remains in NSR and has no further episodes of Afib given this occurred during cardiac cath.   Ascending Thoracic Aortic Aneurysm: Found to have a ascending thoracic AAA with maximum transverse diameter of 4.6 x 4.6 cm on CTA chest. She will need referral to a CT surgeon as an outpatient in [place] She will also require semi-annual follow up with either CTA or MRA.    Normocytic Anemia: Hgb 13.0 on admission, but then dropped to 9.3 by day 3 of admission. Initially there was concern for a retroperitoneal or GI bleed given this acute drop and development of hypotension in the setting of being on a heparin drip. She had no evidence of GI bleeding, including melena or hematochezia. A CT abd/pelvis was performed which showed no evidence of a retroperitoneal bleed. Her hemoglobin remained stable in the 9-11 range. It's possible that she has some degree of anemia related to her chronic rheumatoid arthritis.   Hypotension with Hx HTN: She developed significant hypotension after her cardiac cath and required Levophed for about 8 hours. The etiology of her  hypotension is unclear, but may have been related to her going to AFib with RVR. By time of discharge, her BPs were stable in the 140s systolic and she was started on Losartan 50 mg daily. She will need a BP recheck at her follow up visit and possible uptitration of her Losartan.   Rheumatoid Arthritis: Patient reported being on Etodolac (NSAID) at home but no DMARD. 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 She reports having a Rheumatologist in [place] but if she plans on staying in the [place] area would refer her to Rheum here.   Osteoporosis: Noted to have a T10 vertebral compression fracture on CXR which is consistent with osteoporosis. She was started on calcium, vitamin D, and Alendronate 70 mg Qweekly. She will have follow up in the [facility] to ensure she has started Alendronate.   Hx Hashimoto's Thyroiditis: TSH found to be mildly low at 0.103. Her home Synthroid was decreased to 100 mcg daily. Will need repeat TSH 4-6 weeks from now (mid-August) with her PCP.   Discharge Vitals:   BP 140/65 mmHg  Pulse 76  Temp(Src) 98.1 F (36.7 C) (Oral)  Resp 20  Ht 5\' 3"  (1.6 m)  Wt 118 lb 3.2 oz (53.615 kg)  BMI 20.94 kg/m2  SpO2 92%  LMP  (LMP  Unknown) Physical Exam General: elderly woman sitting up in bed, pleasant, NAD HEENT: [place]/AT, EOMI, sclera anicteric, mucus membranes moist CV: RRR, no m/g/r Pulm: minimal crackles at bases, breaths non-labored on room air Abd: BS+, soft, non-tender Ext: warm, no peripheral edema. 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  Neuro: alert and oriented x 3  Pertinent Labs, Studies, and Procedures:  CXR 05/31/16: Enlargement of the thoracic aorta and concern for a thoracic aortic aneurysm. This could better characterized with CT examination. Aortic atherosclerosis.  Linear densities at the left lung base may represent scarring or atelectasis, particularly in the lingula. This could also be better characterized with CT.  T10 compression fracture. Fracture is age indeterminate.  CTA Chest 05/31/16 No demonstrable pulmonary embolus.  Ascending thoracic aorta has a maximum transverse diameter 4.6 x 4.6 cm. Ascending thoracic aortic aneurysm. Recommend semi-annual imaging followup by CTA or MRA and referral to cardiothoracic surgery if not already obtained. This recommendation follows 2010 ACCF/AHA/AATS/ACR/ASA/SCA/SCAI/SIR/STS/SVM Guidelines for the Diagnosis and Management of Patients With Thoracic Aortic Disease. Circulation. 2010; 121: H062-B762.  There are atherosclerotic foci of calcification in the aorta. There are scattered foci of coronary artery calcification. Pericardium is not appreciably thickened.  Areas of bronchitis and atelectasis. No frank consolidation.  No apparent adenopathy.  Myoview 06/01/16 1. Small is moderate region of reversible perfusion in the mid and basilar segment of lateral wall.  2. Normal left ventricular wall motion.  3. Left ventricular ejection fraction 71%  4. Non invasive risk stratification*: Intermediate  MRA Chest wo contrast 06/01/16 Fusiform aneurysm of the ascending thoracic aorta measuring up to 4.4  cm.  No evidence for a thoracic aortic dissection.  Development of small pleural effusions. Small amount of pericardial fluid.  Volume loss and consolidation in the lower lobes and probably within the right middle lobe. Consider further characterization with chest Radiography.  CT Abd/Pelvis wo contrast 06/02/16 1. No evidence of retroperitoneal hematoma or groin hematoma. 2. Atherosclerotic calcification of the abdominal aorta. 3. Bibasilar atelectasis and small pericardial effusion.  Echo 06/01/16 Study Conclusions - Left ventricle: The cavity size was normal. Wall thickness was  increased in a pattern of moderate LVH. Systolic function was  normal. The estimated ejection fraction was in the range of 55%  to 60%. Wall motion was normal; there were no regional wall  motion abnormalities. Left ventricular diastolic function  parameters were normal. - Aortic valve: There was mild regurgitation. - Left atrium: The atrium was mildly dilated.  Left Heart Cath 06/02/16 1. Ost LAD to Prox LAD lesion, 45% stenosed. 2. Ramus lesion, 30% stenosed. 3. RPDA lesion, 30% stenosed. 4. Prox RCA to Dist RCA lesion, 25% stenosed.   Widely patent coronary arteries with nonobstructive three-vessel atherosclerosis.  Ostial 40-50% LAD.  False positive myocardial perfusion imaging and chest pain is felt to be nonischemic.  Overall normal LV function with EF 50-55%  Intraprocedural development of atrial fibrillation with rapid ventricular response. It occurred while attempting to engage the left coronary.   Discharge Instructions:     Discharge Instructions    Diet - low sodium heart healthy    Complete by:  As directed      Discharge instructions    Complete by:  As directed   It was a pleasure taking care of you, Whitney Mayer.  Here are the important things to remember: - Continue Eliquis 2.5 mg twice daily. This medication is help keep your blood thin due to the atrial  fibrillation that you had. You will likely only be on this medicine for 3 months. - We started you on calcium and vitamin D for your bones, continue taking these. In addition, we would like for you to start Fosamax 70 mg every week (pick one day of the week you will take this, either Sunday or Monday). This medication helps keep your bones strong. - Decrease Losartan to 50 mg daily - Our clinic ([facility]) located on the ground floor of the hospital will contact you this week for an appointment. Please bring all of your medications to this appointment.   Take care, Dr. Beckie Salts     Increase activity slowly    Complete by:  As directed            Signed: Su Hoff, MD 06/04/2016, 11:39 AM   Pager: [phone]

## 2016-06-04 NOTE — Progress Notes (Signed)
PATIENT ID: Whitney Mayer is an 56F with paroxysmal atrial fibrillation, Rheumatoid arthritis, and moderate CAD here with chest pain and intermediate risk stress test. Found to have moderate CAD on cath.  Subsequently developed hypotension requiring levophed and atrial fibrillation with RVR.   SUBJECTIVE:  Feeling much better this AM.  Denies chest pain or shortness of breath.   PHYSICAL EXAM Filed Vitals:   06/03/16 1500 06/03/16 2038 06/04/16 0500 06/04/16 0900  BP:  95/55  140/65  Pulse: 66 70  76  Temp:  98.1 F (36.7 C)    TempSrc:  Oral    Resp: 21 23  20   Height:      Weight:   118 lb 3.2 oz (53.615 kg)   SpO2: 94% 94%  92%   General:  Well-appearing.  Neck: No JVD Lungs:  CTAB. No crackles, rhonchi or wheezes.  Heart:  RRR.  No m/r/g.  Normal S1/S2 Abdomen:  Soft, NT, ND.  +BS Extremities:     LABS: Lab Results  Component Value Date   TROPONINI 0.03* 06/01/2016   No results found for this or any previous visit (from the past 24 hour(s)).  Intake/Output Summary (Last 24 hours) at 06/04/16 1001 Last data filed at 06/04/16 0700  Gross per 24 hour  Intake    680 ml  Output   1450 ml  Net   -770 ml   Telemetry :  Atrial fibrillation converted to sinus rhythm 6pm on 06/02/16.  Now PVCs and sinus bradycardia.   Echo 06/02/16:  Study Conclusions  - Left ventricle: The cavity size was normal. There was moderate  focal basal hypertrophy. Systolic function was normal. The  estimated ejection fraction was in the range of 60% to 65%. Wall  motion was normal; there were no regional wall motion  abnormalities. - Aortic valve: There was mild regurgitation. - Mitral valve: There was trivial regurgitation. - Right ventricle: Systolic function was mildly reduced. - Pulmonary arteries: PA peak pressure: 37 mm Hg (S). - Pericardium, extracardiac: A small, free-flowing pericardial  effusion was identified circumferential to the heart. The fluid  had no internal  echoes.  Impressions:  - The right ventricular systolic pressure was increased consistent  with mild pulmonary hypertension.  ASSESSMENT AND PLAN:  Principal Problem:   Chest pain Active Problems:   Hashimoto's thyroiditis   Thoracic ascending aortic aneurysm (HCC)   RA (rheumatoid arthritis) (HCC)   Coronary artery calcification   Aortic calcification (HCC)   Pain in the chest    1.  Paroxysmal atrial fibrillation: developed atrial fib during the cath with injection of the left main coronary .  Converted back to sinus rhythm after amiodarone and IV diltiazem.  Continue Eliquis. This patients CHA2DS2-VASc Score and unadjusted Ischemic Stroke Rate (% per year) is equal to 4.8 % stroke rate/year from a score of 4  Above score calculated as 1 point each if present [CHF, HTN, DM, Vascular=MI/PAD/Aortic Plaque, Age if 65-74, or Female] Above score calculated as 2 points each if present [Age > 75, or Stroke/TIA/TE]  I'm not sure that she needs lifelong eliquis based on paroxysmal atrial fib that occurred during a cath .  I would recommend Eliquis for 3 months and then reassess.   If she does not have any further episodes, I think she could be changed to ASA instead of Eliquis   2.   Moderate CAD:     3.   Thoracic aortic aneurysm: 4.6cm x4.6cm.  Q6 month imaging recommended.  4.   UTI: Will start Bactrim DS one tab bid x10 days.  5. Hypertension:   Was on losartan 100 mg a day .  This was held when she developed hypotension several days ago.  Would resume Losartan 50 mg a day  She can have it titrated up as needed once she is back home     Kristeen Miss, MD  06/04/2016 10:36 AM    [facility] [address] [place],  [address] [place], [place]  [address] Pager [phone] Phone: [phone]; Fax: [phone]

## 2016-06-04 NOTE — Plan of Care (Signed)
Problem: Education: Goal: Knowledge of [facility] Education information/materials will improve Outcome: Progressing Plan of care reviewed with patient and patient's daughter Freddy Jaksch.  Patient and Nellie started understanding.  Medication education provided to patient and patient's daughter on all medications administered thus far this shift.  Patient and Nellie stated understanding.  Patient's family had been helping patient stand and pivot to and from the bedside commode per day shift RN.  RN explained to patient and patient's daughter that RN was ok with Nellie assisting patient to and from bedside commode.  RN instructed patient and/or patient's daughter to call if any assistance was needed.  Patient and Nellie stated understanding.

## 2016-06-04 NOTE — Progress Notes (Signed)
   Subjective: Noted to have some crackles at her lung bases by nursing last night and to have desaturated while transferring to and from the bedside commode. She reports she is feeling fine. Denies any chest pain or SOB.   Objective: Vital signs in last 24 hours: Filed Vitals:   06/03/16 1355 06/03/16 1500 06/03/16 2038 06/04/16 0500  BP: 99/48  95/55   Pulse: 68 66 70   Temp: 97.9 F (36.6 C)  98.1 F (36.7 C)   TempSrc: Oral  Oral   Resp: 19 21 23    Height:      Weight:    118 lb 3.2 oz (53.615 kg)  SpO2: 88% 94% 94%    Physical Exam General: elderly woman sitting up in bed, pleasant, NAD HEENT: [place]/AT, EOMI, sclera anicteric, mucus membranes moist CV: RRR, no m/g/r Pulm: minimal crackles at bases, breaths non-labored on room air Abd: BS+, soft, non-tender Ext: warm, no peripheral edema. 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  Neuro: alert and oriented x 3  Assessment/Plan:  Atypical Chest Pain: Her chest pain has resolved. Unclear the etiology for her initial chest pain symptoms, but work up for aortic dissection was negative and acute MI ruled out. Her cath on 7/7 showed non-obstructive coronary artery disease. Both echocardiograms were normal. She had some mild bradycardia so will hold off on starting a beta blocker right now.  - ASA stopped since on Eliquis for AFib - Consider beta blocker when bradycardia and BPs better - No statin given age   Atrial Fibrillation: Went into Afib with RVR during cardiac cath procedure then spontaneously converted to NSR. She is NSR currently.  - Continue Eliquis 2.5 mg BID  Asymptomatic Bacteruria: Patient denies any dysuria, hematuria, frequency. UA with small leuks, neg nitrites, and WBCs 6-30, few bacteria. Would favor not treating for UTI but has already been started on Bactrim course. - Continue Bactrim for 3 day course given it is uncomplicated  Ascending Thoracic Aortic Aneurysm: CTA Chest showed an ascending  thoracic aorta with a maximum transverse diameter of 4.6 x 4.6 cm. Radiology recommended semi-annual imaging followup by CTA or MRA and referral to cardiothoracic surgery if not already obtained.  - Referral to CTS on discharge - Semi-Annual follow up  Normocytic Anemia: Hgb stable. She has no evidence of bleeding on exam.  - Will need to be monitored as an outpatient   Hypotension with Hx HTN: BPs still mildly low in the 90s systolic.  - Continue to hold home BP meds  Rheumatoid Arthritis: Patient reports being on Etodolac at home but no DMARD. - Would refer to outpatient Rheum versus follow up with Rheum in 9/7  Osteoporosis: Vertebral fracture seen on CXR diagnostic of osteoporosis. Started on calcium and vitamin D. - Consider starting bisphosphonate on discharge   Hx Hashimoto's Thyroiditis: Currently on levothyroxine 112 mcg daily at home. TSH was low at 0.103.  - Continue synthroid 100 mcg daily - Repeat TSH in 6 weeks as outpatient   Diet: Heart healthy DVT PPx: Eliquis  Dispo: Discharge possibly today or tomorrow   LOS: 2 days   [place] MD 06/04/2016, 7:29 AM Pager: [phone]

## 2016-06-05 ENCOUNTER — Encounter (HOSPITAL_COMMUNITY): Payer: Self-pay | Admitting: Cardiovascular Disease

## 2016-06-08 ENCOUNTER — Ambulatory Visit (HOSPITAL_COMMUNITY)
Admission: RE | Admit: 2016-06-08 | Discharge: 2016-06-08 | Disposition: A | Discharge status: Home / Self Care | Payer: Medicare (Managed Care) | Source: Ambulatory Visit | Attending: Internal Medicine | Admitting: Internal Medicine

## 2016-06-08 ENCOUNTER — Other Ambulatory Visit: Payer: Self-pay

## 2016-06-08 ENCOUNTER — Ambulatory Visit (INDEPENDENT_AMBULATORY_CARE_PROVIDER_SITE_OTHER): Payer: Medicare (Managed Care) | Admitting: Internal Medicine

## 2016-06-08 VITALS — BP 138/95 | HR 102 | Temp 97.9°F

## 2016-06-08 DIAGNOSIS — M810 Age-related osteoporosis without current pathological fracture: Secondary | ICD-10-CM | POA: Diagnosis not present

## 2016-06-08 DIAGNOSIS — I4891 Unspecified atrial fibrillation: Secondary | ICD-10-CM | POA: Diagnosis not present

## 2016-06-08 DIAGNOSIS — I10 Essential (primary) hypertension: Secondary | ICD-10-CM | POA: Insufficient documentation

## 2016-06-08 DIAGNOSIS — R9431 Abnormal electrocardiogram [ECG] [EKG]: Secondary | ICD-10-CM | POA: Diagnosis not present

## 2016-06-08 DIAGNOSIS — Z7901 Long term (current) use of anticoagulants: Secondary | ICD-10-CM

## 2016-06-08 DIAGNOSIS — E063 Autoimmune thyroiditis: Secondary | ICD-10-CM | POA: Diagnosis not present

## 2016-06-08 DIAGNOSIS — I700 Atherosclerosis of aorta: Secondary | ICD-10-CM

## 2016-06-08 MED ORDER — METOPROLOL TARTRATE 25 MG PO TABS: 25.0000 mg | ORAL_TABLET | Freq: Two times a day (BID) | ORAL | Status: DC

## 2016-06-08 NOTE — Progress Notes (Signed)
Patient ID: Whitney Mayer, female   DOB: 05/30/1932, 82 y.o.   MRN: [id number]    CC: hospital follow up for Afib, HTn, etc HPI: [name] is a 82 y.o. woman with PMH noted below here for hospital follow up regardin CAD, HTN, Afib induced by cath, osteoporosis, hypoTH, thoracic aneurysm.  Please see Problem List/A&P for the status of the patient's chronic medical problems   Past Medical History  Diagnosis Date  . Hypertension   . Thyroid disease   . Hypothyroidism   . Pneumonia ~ 1943  . Rheumatoid arthritis (HCC)     "all over; hands, feet, knees, joints" (05/31/2016)    Review of Systems:  Negative except per HPI  Physical Exam: Filed Vitals:   06/08/16 0900 06/08/16 1000  BP: 147/92 138/95  Pulse: 101 102  Temp: 97.9 F (36.6 C)   TempSrc: Oral   SpO2: 96%     General: A&O, in NAD, frail in wheelchair CV: slightly tachycardic to 100 but normal s1, s2, no m/r/g, no carotid bruits appreciated Resp: equal and symmetric breath sounds, no wheezing or crackles  Abdomen: soft, nontender, nondistended, +BS Extremities: no edema Neurologic: no focal neurological deficits   Assessment & Plan:   See encounters tab for problem based medical decision making. Patient discussed with Dr. Oswaldo Done  i gave her the results of the cath as well as MRI and explained the results and let her know that she can call the clinic if she has any questions. Also gave her letter for airline

## 2016-06-08 NOTE — Assessment & Plan Note (Signed)
She is compliant with her alendronate and knows how to take it. Has 3 very supportive daughters

## 2016-06-08 NOTE — Progress Notes (Signed)
Internal Medicine Clinic Attending  Case discussed with Dr. Saraiya at the time of the visit.  We reviewed the resident's history and exam and pertinent patient test results.  I agree with the assessment, diagnosis, and plan of care documented in the resident's note.  

## 2016-06-08 NOTE — Assessment & Plan Note (Signed)
Patient on synthroid 100 mcg daily. TSH in the hospital was 0.10. She says she feels fatigued but no other symptoms of hypothyroidism. Explained that the tiredness is probably because she was just admitted in the hospital.  -Continue synthroid 100 mcg daily -Advised to repeat TSh in 4 weeks -Advised home physical therapy once she gets back

## 2016-06-08 NOTE — Assessment & Plan Note (Signed)
Patient developed Afib with RVR after undergoing LHC for chest pain and subsequently developed hypotension requiring levophed. She is doing well today. She denies any chest pain or palpitations. She is taking her Eliquis. She has stopped her aspirin as directed. She has enough meds at home, and she is flying to [place] next week.   On exam, she was slightly tachycardic, but regular rate. An EKG performed in the clinic showed normal sinus rhythm with rate in the 80s.   Plan Continue ELiquis for 3 months. Patient advised to follow up with her PCP in [place] to discuss risks and benefits of extending anticoagulation beyond 3 months. I also asked the pt to follow up with Cardiology for a heart monitor to definitely rule out afib.   -Started metoprolol 25 mg BID for rate control, as well as her 3 vessel CAD.

## 2016-06-08 NOTE — Patient Instructions (Addendum)
Thank you for your visit today.  Atrial fibrillation: Your EKG shows no Afib so reassuring. The Eliquis should be taken for 3 months. Please talk with your primary care doctor about whether to continue the Eliquis beyond that.          Also you will need to see Cardiology to have a heart monitor for few days to make sure there is no Afib.                             Please take metoprolol 25 mg twice a day for your blood pressure and for your heart rate  Thoracic aortic aneurysm: You will need to do imaging (MRI) every 6 months for your thoracic aortic aneurysm (widening of the vessel). Have provided a copy of the result.  Osteoporosis: Please take the Fosamax once a week for your bone health and to reduce the risk of fracture  Thyroid: Please check your thyroid 4 weeks after you go back home. Continue current dose of synthroid for now.   Weakness: Please follow up at home regarding physical therapy  If you need any further reports when you go back home to [place] Please call our clinic at [phone], and we will be happy to send any reports that you may need.   It was a pleasure taking care of you

## 2016-06-08 NOTE — Assessment & Plan Note (Signed)
BP Readings from Last 3 Encounters:  06/08/16 138/95  06/04/16 140/65   Blood pressure repeat was 138/95 HR 102.  -Continue losartan 50 mg daily -Start metoprolol 25 mg BID

## 2016-07-04 ENCOUNTER — Ambulatory Visit (INDEPENDENT_AMBULATORY_CARE_PROVIDER_SITE_OTHER): Payer: Medicare (Managed Care) | Admitting: Internal Medicine

## 2016-07-04 ENCOUNTER — Encounter (INDEPENDENT_AMBULATORY_CARE_PROVIDER_SITE_OTHER): Payer: Self-pay

## 2016-07-04 VITALS — BP 158/87 | HR 89 | Temp 97.6°F | Ht 62.0 in | Wt 108.0 lb

## 2016-07-04 DIAGNOSIS — E063 Autoimmune thyroiditis: Secondary | ICD-10-CM | POA: Diagnosis not present

## 2016-07-04 DIAGNOSIS — R531 Weakness: Secondary | ICD-10-CM | POA: Diagnosis not present

## 2016-07-04 DIAGNOSIS — Z7901 Long term (current) use of anticoagulants: Secondary | ICD-10-CM | POA: Diagnosis not present

## 2016-07-04 DIAGNOSIS — Z8739 Personal history of other diseases of the musculoskeletal system and connective tissue: Secondary | ICD-10-CM | POA: Diagnosis not present

## 2016-07-04 DIAGNOSIS — I10 Essential (primary) hypertension: Secondary | ICD-10-CM

## 2016-07-04 DIAGNOSIS — M069 Rheumatoid arthritis, unspecified: Secondary | ICD-10-CM | POA: Diagnosis not present

## 2016-07-04 DIAGNOSIS — I4891 Unspecified atrial fibrillation: Secondary | ICD-10-CM

## 2016-07-04 DIAGNOSIS — E039 Hypothyroidism, unspecified: Secondary | ICD-10-CM

## 2016-07-04 MED ORDER — DILTIAZEM HCL ER COATED BEADS 120 MG PO CP24: 120.0000 mg | ORAL_CAPSULE | Freq: Every day | ORAL | 0 refills | Status: DC

## 2016-07-04 NOTE — Assessment & Plan Note (Addendum)
Patient says that she started feeling a little weak after the metoprolol was started last visit. It happens throughout the day. Daughter describes that she would be sitting in wheelchair and all of a sudden she would have "episodes" lasting few minutes which she would just feel weak. Daughters say that this did not happen prior to the hospitalization and prior to last visit. She also has associated pruritus night time. On exam, no focal deficits- normal strength in all extremities.  Most likely the weakness is due to the beta blocker.  However, her hemoglobin was around 9 on discharge with baseline around 11. She is anticoagulated on Eliquis and also takes several NSAIDsso could be anemic. She also has hypothyroidism . She also did not receive physical therapy since discharge. 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    -Home health physical therapy, and RN -check TSH--> WNL -Check CBC and CMET---> HgB back to 12.8 baseline. CMET unremarkable -check RF and CRP and antiCCP-- > all elevated referred her to rheum -asked pt to stop the NSAIds  -stop metoprolol and switch to diltiazem  -check Vitamin D level---> WNL -patient to follow up next week

## 2016-07-04 NOTE — Patient Instructions (Addendum)
Thank you for your visit today  Please stop the metoprolol. I am switching to Diltiazem once a day. Also, please stop the ibuprofen and etodolac (Lodine)  We have obtained some lab work- and I will call you with the results once they are back.   Please follow up with cardiology about the heart monitor  We also referred for home health physical therapy, and nursing. It will also be important to check the pulse and the nurse can do that   Please follow up in 1 week.

## 2016-07-04 NOTE — Assessment & Plan Note (Signed)
Patient's TSH was low when checked during the hospital. She is currently on synthroid 100 mcg daily. She has symptoms of fatigue and weakness today.  -check both TSH and free T4. Synthroid will be adjusted depending on the results

## 2016-07-04 NOTE — Progress Notes (Signed)
    CC: weakness, pruritus, HTN, afib, hypothyroidism, RA HPI: [name] is a 82 y.o. woman with PMH noted below here for weakness, pruritus, HTN, afib, hypothyroidism, RA  Please see Problem List/A&P for the status of the patient's chronic medical problems   Past Medical History:  Diagnosis Date  . Hypertension   . Hypothyroidism   . Pneumonia ~ 1943  . Rheumatoid arthritis (HCC)    "all over; hands, feet, knees, joints" (05/31/2016)  . Thyroid disease     Review of Systems:  Denies fevers, chills. Has been feeling fatigued ever since metoprolol was started. Also has some pruritus at night time. Respiratory: Negative for cough, shortness of breath  Cardiovascular: Negative for CP, palpitations  Gastrointestinal: Negative for nausea, vomiting, abdominal pain, hematochezia or melena GU: negative for  hematuria. Musculoskeletal: has joint pain, and hand pain  Physical Exam: Vitals:   07/04/16 1408 07/04/16 1431 07/04/16 1433  BP: (!) 154/95 (!) 137/92 (!) 158/87  Pulse: 96 90 89  Temp: 97.6 F (36.4 C)    TempSrc: Oral    SpO2: 95%    Weight: 108 lb (49 kg)    Height: 5\' 2"  (1.575 m)      General: A&O, in NAD Neck: supple, midline trachea CV: RRR (does not seem to be in afib), normal s1, s2, no m/r/g, no carotid bruits appreciated Resp: equal and symmetric breath sounds, no wheezing or crackles  Abdomen: soft, nontender, nondistended, +BS Skin: warm, dry, intact, no open lesions or rashes noted Extremities: pulses intact b/l, no edema                    Has swan neck deformities of both hands with mild tenderness to palpation  Neurologic: normal strength in all extremities    Assessment & Plan:   See encounters tab for problem based medical decision making. Patient discussed with Dr. 

## 2016-07-04 NOTE — Assessment & Plan Note (Signed)
Pt has been taking the losartan 50 mg, and the metoprolol 25 mg BID. Her repeat blood pressure was 137/42 with HR of 90 and is at goal today  -continue losartan -we have stopped the metoprolol and switched to diltiazem 120 mg XR due to patient's weakness after starting metoprolol -follow up in 1 week

## 2016-07-04 NOTE — Assessment & Plan Note (Signed)
Patient is anticoagulated on Eliquis and is rate controlled on metoprolol 25 mg BID, and her HR is 90 today at goal.  On exam, RRR. She denies any palpitations or fluttering.   On last visit patient was going to go back to [place] but she ended up staying here.   -continue Eliquis  -Referred to EP for event monitor to rule out Afib- as her Afib was induced after undergoing cath -Diltiazem 120 mg XR daily

## 2016-07-05 ENCOUNTER — Telehealth: Payer: Self-pay | Admitting: Licensed Clinical Social Worker

## 2016-07-05 NOTE — Telephone Encounter (Signed)
Whitney Mayer is currently visiting from [place]  Pt is staying with her daughter.  Whitney Mayer is covered by a Medicare Replacement plan based in [place], but has OON benefits and can utilize any Medicare HH agency.  Prior Auth from [facility] agency required and clinicals will need to be faxed to [phone]. CSW placed call to patient.  Daughter speaks [place] and patient also speaks [place].  CSW inquired if patient would like for this worker to call back with a telephonic interpreter.  Pt declined.  CSW discussed referral for [facility], pt agreeable and aware this working attempting to find an accepting agency.  Address and phone number confirmed.

## 2016-07-05 NOTE — Assessment & Plan Note (Addendum)
Patient had severe swan neck deformities of both hands which is preventing her from using her hands a lot. On exam, joints were mildly tender to palpation. Patient's daughter said that they thought she had rheumatoid arthritis, but in [place] she was only on NSAIDs. She has never been on DMARDs.   -check RF, anti-CCP, CRP -Refer to rheumatology based on the results , as DMARDs can still slow disease progression even with her age   Addendum: CRP, RF and anti-CCP are all elevated. She does have RA Referred her to rheumatology and informed the pt on phone.

## 2016-07-06 LAB — CMP14 + ANION GAP
ALBUMIN: 4.2 g/dL (ref 3.5–4.7)
ALK PHOS: 108 IU/L (ref 39–117)
ALT: 11 IU/L (ref 0–32)
ANION GAP: 18 mmol/L (ref 10.0–18.0)
AST: 16 IU/L (ref 0–40)
Albumin/Globulin Ratio: 1.4 (ref 1.2–2.2)
BILIRUBIN TOTAL: 0.4 mg/dL (ref 0.0–1.2)
BUN / CREAT RATIO: 21 (ref 12–28)
BUN: 15 mg/dL (ref 8–27)
CHLORIDE: 96 mmol/L (ref 96–106)
CO2: 20 mmol/L (ref 18–29)
CREATININE: 0.7 mg/dL (ref 0.57–1.00)
Calcium: 9.1 mg/dL (ref 8.7–10.3)
GFR calc Af Amer: 93 mL/min/{1.73_m2} (ref >59)
GFR calc non Af Amer: 81 mL/min/{1.73_m2} (ref >59)
GLOBULIN, TOTAL: 3.1 g/dL (ref 1.5–4.5)
GLUCOSE: 88 mg/dL (ref 65–99)
Potassium: 4.5 mmol/L (ref 3.5–5.2)
SODIUM: 134 mmol/L (ref 134–144)
Total Protein: 7.3 g/dL (ref 6.0–8.5)

## 2016-07-06 LAB — CBC
HEMOGLOBIN: 12.8 g/dL (ref 11.1–15.9)
Hematocrit: 40.3 % (ref 34.0–46.6)
MCH: 26.1 pg — ABNORMAL LOW (ref 26.6–33.0)
MCHC: 31.8 g/dL (ref 31.5–35.7)
MCV: 82 fL (ref 79–97)
PLATELETS: 379 10*3/uL (ref 150–379)
RBC: 4.91 x10E6/uL (ref 3.77–5.28)
RDW: 16 % — AB (ref 12.3–15.4)
WBC: 11.6 10*3/uL — ABNORMAL HIGH (ref 3.4–10.8)

## 2016-07-06 LAB — C-REACTIVE PROTEIN: CRP: 7.2 mg/L — AB (ref 0.0–4.9)

## 2016-07-06 LAB — VITAMIN D 25 HYDROXY (VIT D DEFICIENCY, FRACTURES): Vit D, 25-Hydroxy: 35.5 ng/mL (ref 30.0–100.0)

## 2016-07-06 LAB — CYCLIC CITRUL PEPTIDE ANTIBODY, IGG/IGA

## 2016-07-06 LAB — T4, FREE: FREE T4: 1.4 ng/dL (ref 0.82–1.77)

## 2016-07-06 LAB — RHEUMATOID FACTOR: RHEUMATOID FACTOR: 17.6 [IU]/mL — AB (ref 0.0–13.9)

## 2016-07-06 LAB — CK: Total CK: 23 U/L — ABNORMAL LOW (ref 24–173)

## 2016-07-06 LAB — TSH: TSH: 3.84 u[IU]/mL (ref 0.450–4.500)

## 2016-07-06 NOTE — Addendum Note (Signed)
Addended by: Deneise Lever A on: 07/06/2016 09:22 AM   Modules accepted: Orders

## 2016-07-06 NOTE — Telephone Encounter (Signed)
Received note from [facility], CSW will need to confirm spelling of pt's name and social security number for [facility] Prior Auth.  Upon chart review, CSW noted potential two surnames on Advanced Directives: Connye Burkitt.  CSW has left message with patient to confirm which name is attached to SS number.

## 2016-07-06 NOTE — Telephone Encounter (Addendum)
Note from [facility] agency: SPOKE WITH Lucita Lora [phone]. [employer] MCR HMO PLAN. OON. NO OON BENEFITS. PLAN IS BASED IN [place] (OUT OF COUNTRY PLAN) THERE ARE NO IN NETWORK STATE PROVIDERS. MEMBER WILL NEED TO CONTACT MEDICARE FOR A COB TO UPDATE MEDICARE AS PRIMARY TO BE COVERED.  CSW will notify patient, awaiting return call.  Will provide Medicare customer services number and inquire if pt would like to private pay for [facility] services.

## 2016-07-06 NOTE — Telephone Encounter (Signed)
  I informed thee patient's 2 daughter's that her TSH was normal. HgB was back to baseline.  Informed that her RF was positive and her anti-CCP was also elevated so she does have rheumatoid arthritis. Informed that I would like the pt to see a Rheumatologist, and I put the referral in and they will hear back regarding the appointment date and time.   Explained that pt would probably need to be on a DMARD.  She is doing well. Metoprolol was stopped and diltiazem was started per instructions.  They will follow up here next week as scheduled.

## 2016-07-07 NOTE — Progress Notes (Signed)
Internal Medicine Clinic Attending  Case discussed with Dr. Saraiya at the time of the visit.  We reviewed the resident's history and exam and pertinent patient test results.  I agree with the assessment, diagnosis, and plan of care documented in the resident's note.  

## 2016-07-12 ENCOUNTER — Ambulatory Visit (INDEPENDENT_AMBULATORY_CARE_PROVIDER_SITE_OTHER): Payer: Medicaid Other | Admitting: Internal Medicine

## 2016-07-12 ENCOUNTER — Encounter: Payer: Self-pay | Admitting: Internal Medicine

## 2016-07-12 VITALS — BP 117/78 | HR 100 | Temp 98.3°F

## 2016-07-12 DIAGNOSIS — M05742 Rheumatoid arthritis with rheumatoid factor of left hand without organ or systems involvement: Secondary | ICD-10-CM

## 2016-07-12 DIAGNOSIS — I10 Essential (primary) hypertension: Secondary | ICD-10-CM

## 2016-07-12 DIAGNOSIS — Z79899 Other long term (current) drug therapy: Secondary | ICD-10-CM | POA: Diagnosis not present

## 2016-07-12 DIAGNOSIS — M069 Rheumatoid arthritis, unspecified: Secondary | ICD-10-CM

## 2016-07-12 DIAGNOSIS — R531 Weakness: Secondary | ICD-10-CM

## 2016-07-12 DIAGNOSIS — I4891 Unspecified atrial fibrillation: Secondary | ICD-10-CM

## 2016-07-12 DIAGNOSIS — E063 Autoimmune thyroiditis: Secondary | ICD-10-CM | POA: Diagnosis present

## 2016-07-12 DIAGNOSIS — Z7901 Long term (current) use of anticoagulants: Secondary | ICD-10-CM

## 2016-07-12 DIAGNOSIS — M05741 Rheumatoid arthritis with rheumatoid factor of right hand without organ or systems involvement: Secondary | ICD-10-CM

## 2016-07-12 NOTE — Assessment & Plan Note (Signed)
Patient says her weakness spells have resolved after switching from metoprolol to diltiazem. Her TSh and free T4 are WNL.

## 2016-07-12 NOTE — Assessment & Plan Note (Signed)
Her TSH and free T4 are WNL. She is currently on Synthroid 100 mcg. She said she used to take synthroid 112 mcg and is wondering if we should increase the dose.  -continue synthroid 100 mcg -repeat TSH in 3 months

## 2016-07-12 NOTE — Assessment & Plan Note (Signed)
Patient's blood pressure is 117/78 with HR of 100, and is at goal after switching from metoprolol to diltiazem XR 120 today  -continue losartan -continue diltiazem 120 mg xr  -follow up with cardiology about event monitor

## 2016-07-12 NOTE — Patient Instructions (Signed)
Thank you for your visit today  Please continue your current medications.  Please call us if you do not hear back from Rheumatology in 4 weeks- we may start the medicine here until you have an appointment    In preparation for starting a medicine for you rheumatoid arthritis, I am checking few other labs to make sure there is no latent infection like TB or Hepatitis, because the medicine suppresses your immune system.   I am also sending you to an eye doctor for an eye exam to clear you for starting you on Plaquenil   Please follow up in around 2 months

## 2016-07-12 NOTE — Assessment & Plan Note (Signed)
Pt's CRP,  RF and antiCCP were elevated. Both her hands and knees are tender to palpation.  We are int he process of referring her to rheumatology   Plan -Check quantiferon TB, Hepatitis C Ab, Hep B surface antigen and core antibody. -Refer to opthalmology to clear her for starting Plaquenil -Explained that pt should call us in 4 weeks if they do not hear back from Rheumatology- and we can start the plaquenil after these tests are negative until she has an appt with them.

## 2016-07-12 NOTE — Progress Notes (Signed)
   CC: HTN, afib, hypothyroid, RA HPI: [name] is a 82 y.o. woman with PMH noted below here for HTN, afib, hypothyroid, and RA  Please see Problem List/A&P for the status of the patient's chronic medical problems   Past Medical History:  Diagnosis Date  . Hypertension   . Hypothyroidism   . Pneumonia ~ 1943  . Rheumatoid arthritis (HCC)    "all over; hands, feet, knees, joints" (05/31/2016)  . Thyroid disease     Review of Systems: Denies fevers. Says weakness lot better after stopping metop Denies cough, SOB Denies CP, palpitations Has knee pain and hand pain and swelling consistent with RA  Physical Exam: Vitals:   07/12/16 1327  BP: 117/78  Pulse: 100  Temp: 98.3 F (36.8 C)  TempSrc: Oral  SpO2: 97%    General: A&O, in NAD CV: RRR, normal s1, s2, no m/r/g,  Resp: equal and symmetric breath sounds, no wheezing or crackles  Abdomen: soft, nontender, nondistended, +BS Hands: swan neck deformities b/l. Knees are also mildly swollen    Assessment & Plan:   See encounters tab for problem based medical decision making. Patient discussed with Dr. Oswaldo Done

## 2016-07-12 NOTE — Assessment & Plan Note (Addendum)
Patient is rate controlled on diltiazem and anticoagulated on Eliquis.  Her HR is 100 today after switching from metoprolol to diltiazem last week. She is compliant with the diltiazem. She feels well.   -Continue diltiazem -continue eliquis -Pt has appt with EP for event monitor to definitely rule out Afib.  If Afib is ruled out on the event monitor then I think the eliquis can be safely stopped .

## 2016-07-13 LAB — HEPATITIS B SURFACE ANTIGEN: Hepatitis B Surface Ag: NEGATIVE

## 2016-07-13 LAB — HEPATITIS B CORE ANTIBODY, TOTAL: Hep B Core Total Ab: NEGATIVE

## 2016-07-13 LAB — HEPATITIS C ANTIBODY: Hep C Virus Ab: <0.1 s/co ratio (ref 0.0–0.9)

## 2016-07-13 NOTE — Progress Notes (Signed)
Internal Medicine Clinic Attending  Case discussed with Dr. Saraiya at the time of the visit.  We reviewed the resident's history and exam and pertinent patient test results.  I agree with the assessment, diagnosis, and plan of care documented in the resident's note.  

## 2016-07-15 LAB — QUANTIFERON IN TUBE
QFT TB AG MINUS NIL VALUE: 0 IU/mL
QUANTIFERON MITOGEN VALUE: 0.94 IU/mL
QUANTIFERON NIL VALUE: 0.03 [IU]/mL
QUANTIFERON TB AG VALUE: 0.03 [IU]/mL
QUANTIFERON TB GOLD: NEGATIVE

## 2016-07-15 LAB — QUANTIFERON TB GOLD ASSAY (BLOOD)

## 2016-07-23 NOTE — Progress Notes (Signed)
Electrophysiology Office Note   Date:  07/24/2016   ID:  Lamoine, Pirillo [birth year], MRN [id number]  PCP:  Eulah Pont, MD  Primary Electrophysiologist:  Ayauna Mcnay Jorja Loa, MD    Chief Complaint  Patient presents with  . New Patient (Initial Visit)    afib     History of Present Illness: Whitney Mayer is a 82 y.o. female who presents today for electrophysiology evaluation.   Hx HTN, atrial fibrillation, nonobstructive CAD. She was in the hospital for chest pain and was found to have nonobstructive coronary disease. During her heart catheterization, she went into atrial fibrillation. Her blood pressure dropped and prior to cardioversion, she reverted back to sinus rhythm. She was discharged from the hospital on Eliquis. Since that time, she has felt well without any major complaints. She has not noticed fatigue, weakness, shortness of breath. She also does not endorse any palpitations. She is tolerating the Eliquis without any issues.    Today, she denies symptoms of palpitations, chest pain, shortness of breath, orthopnea, PND, lower extremity edema, claudication, dizziness, presyncope, syncope, bleeding, or neurologic sequela. The patient is tolerating medications without difficulties and is otherwise without complaint today.    Past Medical History:  Diagnosis Date  . Hypertension   . Hypothyroidism   . Pneumonia ~ 1943  . Rheumatoid arthritis (HCC)    "all over; hands, feet, knees, joints" (05/31/2016)  . Thyroid disease    Past Surgical History:  Procedure Laterality Date  . CARDIAC CATHETERIZATION N/A 06/02/2016   Procedure: Left Heart Cath and Coronary Angiography;  Surgeon: Lyn Records, MD;  Location[facility] INVASIVE CV LAB;  Service: Cardiovascular;  Laterality: N/A;  . CARDIOVERSION N/A 06/02/2016   Procedure: CARDIOVERSION;  Surgeon: Runell Gess, MD;  Location: [name] OR;  Service: Cardiovascular;  Laterality: N/A;  . DILATION AND CURETTAGE OF UTERUS  1980s  . TUBAL  LIGATION  1960s     Current Outpatient Prescriptions  Medication Sig Dispense Refill  . alendronate (FOSAMAX) 70 MG tablet Take 1 tablet (70 mg total) by mouth once a week. Take with a full glass of water on an empty stomach. 52 tablet 0  . apixaban (ELIQUIS) 2.5 MG TABS tablet Take 1 tablet (2.5 mg total) by mouth 2 (two) times daily. 60 tablet 2  . calcium carbonate (OS-CAL - DOSED IN MG OF ELEMENTAL CALCIUM) 1250 (500 Ca) MG tablet Take 1 tablet (500 mg of elemental calcium total) by mouth daily with breakfast. 30 tablet 1  . cholecalciferol 1000 units tablet Take 1 tablet (1,000 Units total) by mouth daily. 30 tablet 1  . clonazePAM (KLONOPIN) 0.5 MG tablet Take 0.5 mg by mouth 2 (two) times daily as needed for anxiety.    Marland Kitchen diltiazem (CARDIZEM CD) 120 MG 24 hr capsule Take 1 capsule (120 mg total) by mouth daily. 30 capsule 0  . docusate sodium (COLACE) 100 MG capsule Take 1 capsule (100 mg total) by mouth daily as needed for mild constipation. 10 capsule 1  . famotidine (PEPCID) 20 MG tablet Take 1 tablet (20 mg total) by mouth daily. 30 tablet 1  . feeding supplement, ENSURE ENLIVE, (ENSURE ENLIVE) LIQD Take 237 mLs by mouth 2 (two) times daily between meals. 237 mL 12  . levothyroxine (SYNTHROID, LEVOTHROID) 100 MCG tablet Take 1 tablet (100 mcg total) by mouth daily before breakfast. 30 tablet 1  . losartan (COZAAR) 100 MG tablet Take 0.5 tablets (50 mg total) by mouth daily. 30 tablet 1  .  nitroGLYCERIN (NITROSTAT) 0.4 MG SL tablet Place 1 tablet (0.4 mg total) under the tongue every 5 (five) minutes as needed for chest pain. 30 tablet 1  . traMADol (ULTRAM) 50 MG tablet Take 50 mg by mouth 3 (three) times daily.     No current facility-administered medications for this visit.     Allergies:   Review of patient's allergies indicates no known allergies.   Social History:  The patient  reports that she has never smoked. She has never used smokeless tobacco. She reports that she does  not drink alcohol or use drugs.   Family History:  The patient's family history includes Kidney disease in her brother; Stroke in her mother.    ROS:  Please see the history of present illness.   Otherwise, review of systems is positive for muscle pain, rash, balance problems.   All other systems are reviewed and negative.    PHYSICAL EXAM: VS:  BP 124/82   Pulse 83   Ht 5\' 2"  (1.575 m)   Wt 110 lb (49.9 kg)   LMP  (LMP Unknown)   BMI 20.12 kg/m  , BMI Body mass index is 20.12 kg/m. GEN: Well nourished, well developed, in no acute distress  HEENT: normal  Neck: no JVD, carotid bruits, or masses Cardiac: RRR; no murmurs, rubs, or gallops,no edema  Respiratory:  clear to auscultation bilaterally, normal work of breathing GI: soft, nontender, nondistended, + BS MS: no deformity or atrophy  Skin: warm and dry Neuro:  Strength and sensation are intact Psych: euthymic mood, full affect  EKG:  EKG is ordered today. Personal review of the ekg ordered shows sinus rhythm, rate 83  Recent Labs: 06/03/2016: Hemoglobin 9.3 07/04/2016: ALT 11; BUN 15; Creatinine, Ser 0.70; Platelets 379; Potassium 4.5; Sodium 134; TSH 3.840    Lipid Panel     Component Value Date/Time   CHOL 132 06/01/2016 0036   TRIG 40 06/01/2016 0036   HDL 48 06/01/2016 0036   CHOLHDL 2.8 06/01/2016 0036   VLDL 8 06/01/2016 0036   LDLCALC 76 06/01/2016 0036     Wt Readings from Last 3 Encounters:  07/24/16 110 lb (49.9 kg)  07/04/16 108 lb (49 kg)  06/04/16 118 lb 3.2 oz (53.6 kg)      Other studies Reviewed: Additional studies/ records that were reviewed today include: TTE 06/02/16, Cath 06/02/16  Review of the above records today demonstrates:   - Left ventricle: The cavity size was normal. There was moderate   focal basal hypertrophy. Systolic function was normal. The   estimated ejection fraction was in the range of 60% to 65%. Wall   motion was normal; there were no regional wall motion    abnormalities. - Aortic valve: There was mild regurgitation. - Mitral valve: There was trivial regurgitation. - Right ventricle: Systolic function was mildly reduced. - Pulmonary arteries: PA peak pressure: 37 mm Hg (S). - Pericardium, extracardiac: A small, free-flowing pericardial   effusion was identified circumferential to the heart. The fluid   had no internal echoes.  1. Ost LAD to Prox LAD lesion, 45% stenosed. 2. Ramus lesion, 30% stenosed. 3. RPDA lesion, 30% stenosed. 4. Prox RCA to Dist RCA lesion, 25% stenosed.    Widely patent coronary arteries with nonobstructive three-vessel atherosclerosis.  Ostial 40-50% LAD.  False positive myocardial perfusion imaging and chest pain is felt to be nonischemic.  Overall normal LV function with EF 50-55%  Intraprocedural development of atrial fibrillation with rapid ventricular response. It  occurred while attempting to engage the left coronary.   ASSESSMENT AND PLAN:  1.  Atrial fibrillation: currently on Eliquis. she does not endorse any symptoms of atrial fibrillation. It is possible that she has asymptomatic runs of atrial fibrillation, we'll place a 48 hour monitor to make a further determination. She does not have any further atrial fibrillation, she Berle Fitz be able to come off her Eliquis, as it is likely that her atrial fibrillation was due to the procedure.  This patients CHA2DS2-VASc Score and unadjusted Ischemic Stroke Rate (% per year) is equal to 7.2 % stroke rate/year from a score of 5  Above score calculated as 1 point each if present [CHF, HTN, DM, Vascular=MI/PAD/Aortic Plaque, Age if 65-74, or Female] Above score calculated as 2 points each if present [Age > 75, or Stroke/TIA/TE]   2. CAD: No current chest pain  3. Hypertension: Currently well controlled    Current medicines are reviewed at length with the patient today.   The patient does not have concerns regarding her medicines.  The following changes were  made today:  none  Labs/ tests ordered today include:  Orders Placed This Encounter  Procedures  . Holter monitor - 48 hour  . EKG 12-Lead     Disposition:   FU with Toy Eisemann pending monitor results  Signed, Nyasha Rahilly Jorja Loa, MD  07/24/2016 2:38 PM     [facility] [address] [place] [address] [phone] (office) [phone] (fax)

## 2016-07-24 ENCOUNTER — Encounter: Payer: Self-pay | Admitting: Cardiology

## 2016-07-24 ENCOUNTER — Ambulatory Visit (INDEPENDENT_AMBULATORY_CARE_PROVIDER_SITE_OTHER): Payer: Commercial Managed Care - HMO | Admitting: Cardiology

## 2016-07-24 VITALS — BP 124/82 | HR 83 | Ht 62.0 in | Wt 110.0 lb

## 2016-07-24 DIAGNOSIS — I480 Paroxysmal atrial fibrillation: Secondary | ICD-10-CM

## 2016-07-24 NOTE — Patient Instructions (Addendum)
Medication Instructions:  Your physician recommends that you continue on your current medications as directed. Please refer to the Current Medication list given to you today.  Labwork: None ordered  Testing/Procedures: Your physician has recommended that you wear a 48 hour holter monitor. Holter monitors are medical devices that record the heart's electrical activity. Doctors most often use these monitors to diagnose arrhythmias. Arrhythmias are problems with the speed or rhythm of the heartbeat. The monitor is a small, portable device. You can wear one while you do your normal daily activities. This is usually used to diagnose what is causing palpitations/syncope (passing out).  Follow-Up: To be determined after holter monitor results have been reviewed by the physician.  We will call you with the results.  If you need a refill on your cardiac medications before your next appointment, please call your pharmacy.  Thank you for choosing [facility]!!   Dory Horn, RN [phone]  Any Other Special Instructions Will Be Listed Below (If Applicable).   Monitor Holter (Holter Monitoring) El monitor Holter es un pequeo dispositivo que se [place] para detectar el ritmo cardaco anormal. Se lo sujeta a la ropa y, [employer] cables, se lo conecta a discos planos Ship broker (electrodos) que se Immunologist. Se lleva puesto de manera ininterrumpida durante 24 a 48horas. INSTRUCCIONES PARA EL CUIDADO EN EL HOGAR  Lleve puesto el monitor Holter en todo momento, incluso mientras hace ejercicio y duerme, durante el tiempo que el mdico se lo haya indicado.  Asegrese de que el monitor Holter est bien sujeto a la ropa o cerca del cuerpo, como el mdico lo haya recomendado.  No moje el monitor ni los cables.  No no se aplique locin ni humectante para el cuerpo en el pecho.  Mantenga la piel seca.  Lleve un diario de las actividades cotidianas, por ejemplo, caminar y Education officer, environmental las  tareas diarias. Si siente que el ritmo cardaco es anormal o que el corazn aletea o se saltea latidos:  Registre lo que est haciendo cuando esto sucede.  Registre la hora del da a la que se presentan los sntomas.  Devuelva el monitor Holter como se lo haya indicado el mdico.  Concurra a todas las visitas de control como se lo haya indicado el mdico. Esto es importante. SOLICITE ATENCIN MDICA DE INMEDIATO SI:  Se siente mareado o se desmaya.  Tiene dificultad para respirar.  Tiene dolor en el pecho, la parte superior del brazo o la [place].  Tiene Programme researcher, broadcasting/film/video y la piel est plida, fra o hmeda.  Siente que la frecuencia cardaca no es la habitual o es anormal.   Esta informacin no tiene Theme park manager el consejo del mdico. Asegrese de hacerle al mdico cualquier pregunta que tenga.   Document Released: 09/10/2009 Document Revised: 12/04/2014 Elsevier Interactive Patient Education [employer]

## 2016-07-26 ENCOUNTER — Telehealth: Payer: Self-pay | Admitting: Internal Medicine

## 2016-07-26 NOTE — Addendum Note (Signed)
Addended by: Deneise Lever A on: 07/26/2016 02:02 PM   Modules accepted: Orders

## 2016-07-26 NOTE — Telephone Encounter (Signed)
I called the pt at 1:15 PM on 8/30 informing that her other labs ordered were unremarkable.  - I asked her to do a CXR prior to starting methotrexate, and also to have the Zoster vaccine.  -Patient will get both of them done and will let us know, and at that time, we can send the methotrexate 7.5 mg weekly along with folic acid.  -I called the rheumatologist's office and she has an appointment with them on October 3rd- they requested that the CXR and zoster vaccine be done prior to appointment so she can be on appropriate regimen for her RA.

## 2016-08-02 ENCOUNTER — Ambulatory Visit (INDEPENDENT_AMBULATORY_CARE_PROVIDER_SITE_OTHER): Payer: Commercial Managed Care - HMO

## 2016-08-02 DIAGNOSIS — I480 Paroxysmal atrial fibrillation: Secondary | ICD-10-CM | POA: Diagnosis not present

## 2016-08-15 DIAGNOSIS — Z049 Encounter for examination and observation for unspecified reason: Secondary | ICD-10-CM | POA: Diagnosis not present

## 2016-08-15 DIAGNOSIS — Z79899 Other long term (current) drug therapy: Secondary | ICD-10-CM | POA: Diagnosis not present

## 2016-08-15 DIAGNOSIS — H25012 Cortical age-related cataract, left eye: Secondary | ICD-10-CM | POA: Diagnosis not present

## 2016-08-15 DIAGNOSIS — H2512 Age-related nuclear cataract, left eye: Secondary | ICD-10-CM | POA: Diagnosis not present

## 2016-08-15 DIAGNOSIS — H04123 Dry eye syndrome of bilateral lacrimal glands: Secondary | ICD-10-CM | POA: Diagnosis not present

## 2016-08-15 DIAGNOSIS — H25011 Cortical age-related cataract, right eye: Secondary | ICD-10-CM | POA: Diagnosis not present

## 2016-08-29 ENCOUNTER — Ambulatory Visit (INDEPENDENT_AMBULATORY_CARE_PROVIDER_SITE_OTHER): Payer: Commercial Managed Care - HMO | Admitting: Rheumatology

## 2016-08-29 DIAGNOSIS — M79672 Pain in left foot: Secondary | ICD-10-CM | POA: Diagnosis not present

## 2016-08-29 DIAGNOSIS — M25521 Pain in right elbow: Secondary | ICD-10-CM

## 2016-08-29 DIAGNOSIS — M25562 Pain in left knee: Secondary | ICD-10-CM | POA: Diagnosis not present

## 2016-08-29 DIAGNOSIS — M25551 Pain in right hip: Secondary | ICD-10-CM

## 2016-08-29 DIAGNOSIS — M25561 Pain in right knee: Secondary | ICD-10-CM

## 2016-08-29 DIAGNOSIS — Z113 Encounter for screening for infections with a predominantly sexual mode of transmission: Secondary | ICD-10-CM | POA: Diagnosis not present

## 2016-08-29 DIAGNOSIS — M79671 Pain in right foot: Secondary | ICD-10-CM

## 2016-08-29 DIAGNOSIS — M25522 Pain in left elbow: Secondary | ICD-10-CM | POA: Diagnosis not present

## 2016-08-29 DIAGNOSIS — R300 Dysuria: Secondary | ICD-10-CM | POA: Diagnosis not present

## 2016-08-29 DIAGNOSIS — M79641 Pain in right hand: Secondary | ICD-10-CM | POA: Diagnosis not present

## 2016-08-29 DIAGNOSIS — M79642 Pain in left hand: Secondary | ICD-10-CM

## 2016-08-29 DIAGNOSIS — M0579 Rheumatoid arthritis with rheumatoid factor of multiple sites without organ or systems involvement: Secondary | ICD-10-CM

## 2016-08-29 DIAGNOSIS — M2550 Pain in unspecified joint: Secondary | ICD-10-CM | POA: Diagnosis not present

## 2016-09-06 DIAGNOSIS — H2512 Age-related nuclear cataract, left eye: Secondary | ICD-10-CM | POA: Diagnosis not present

## 2016-09-06 DIAGNOSIS — H25812 Combined forms of age-related cataract, left eye: Secondary | ICD-10-CM | POA: Diagnosis not present

## 2016-09-11 ENCOUNTER — Ambulatory Visit (HOSPITAL_COMMUNITY)
Admission: RE | Admit: 2016-09-11 | Discharge: 2016-09-11 | Disposition: A | Discharge status: Home / Self Care | Payer: Commercial Managed Care - HMO | Source: Ambulatory Visit | Attending: Student in an Organized Health Care Education/Training Program | Admitting: Student in an Organized Health Care Education/Training Program

## 2016-09-11 ENCOUNTER — Encounter: Payer: Self-pay | Admitting: Internal Medicine

## 2016-09-11 ENCOUNTER — Ambulatory Visit (INDEPENDENT_AMBULATORY_CARE_PROVIDER_SITE_OTHER): Payer: Commercial Managed Care - HMO | Admitting: Internal Medicine

## 2016-09-11 ENCOUNTER — Encounter: Payer: Self-pay | Admitting: *Deleted

## 2016-09-11 VITALS — BP 140/85 | HR 88 | Temp 97.8°F

## 2016-09-11 DIAGNOSIS — I517 Cardiomegaly: Secondary | ICD-10-CM | POA: Diagnosis not present

## 2016-09-11 DIAGNOSIS — R159 Full incontinence of feces: Secondary | ICD-10-CM | POA: Diagnosis not present

## 2016-09-11 DIAGNOSIS — M069 Rheumatoid arthritis, unspecified: Secondary | ICD-10-CM

## 2016-09-11 DIAGNOSIS — M05761 Rheumatoid arthritis with rheumatoid factor of right knee without organ or systems involvement: Secondary | ICD-10-CM

## 2016-09-11 DIAGNOSIS — M05742 Rheumatoid arthritis with rheumatoid factor of left hand without organ or systems involvement: Secondary | ICD-10-CM | POA: Diagnosis not present

## 2016-09-11 DIAGNOSIS — I712 Thoracic aortic aneurysm, without rupture: Secondary | ICD-10-CM | POA: Insufficient documentation

## 2016-09-11 DIAGNOSIS — I4891 Unspecified atrial fibrillation: Secondary | ICD-10-CM | POA: Diagnosis not present

## 2016-09-11 DIAGNOSIS — M05741 Rheumatoid arthritis with rheumatoid factor of right hand without organ or systems involvement: Secondary | ICD-10-CM | POA: Diagnosis not present

## 2016-09-11 DIAGNOSIS — E063 Autoimmune thyroiditis: Secondary | ICD-10-CM

## 2016-09-11 DIAGNOSIS — Z79899 Other long term (current) drug therapy: Secondary | ICD-10-CM

## 2016-09-11 DIAGNOSIS — M05762 Rheumatoid arthritis with rheumatoid factor of left knee without organ or systems involvement: Principal | ICD-10-CM

## 2016-09-11 DIAGNOSIS — R32 Unspecified urinary incontinence: Secondary | ICD-10-CM

## 2016-09-11 DIAGNOSIS — Z789 Other specified health status: Secondary | ICD-10-CM | POA: Insufficient documentation

## 2016-09-11 MED ORDER — LEVOTHYROXINE SODIUM 100 MCG PO TABS: 50.0000 ug | ORAL_TABLET | Freq: Every day | ORAL | 1 refills | Status: DC

## 2016-09-11 NOTE — Assessment & Plan Note (Signed)
No further episodes and halter monitor negative; episode was likely due to left heart cath procedure itself. Patient off of Eliquis now.

## 2016-09-11 NOTE — Assessment & Plan Note (Signed)
Patient with hashimoto's thyroiditis on synthroid replacement therapy. She is supposed to be on and TSH was stable at this dosage, but patient taking . She still continues to complain of cold intolerance and does have bowel and bladder incontinence.   Plan: --will recheck TSH and adjust synthroid as necessary

## 2016-09-11 NOTE — Assessment & Plan Note (Signed)
Patient just established with Dr. Corliss Skains in rheumatology and was started on methotrexate. Still has to get zostavax in ~2 weeks; screening tests so far have been negative, and only has to complete CXR. Patient extremely limited in functionality and activities of daily living by pain.  Patient suffers from chronic low back and bilateral knee pain which is caused by rheumatoid arthritis. Hospital bed will alleviate pain by allowing back and lower extremities to be positioned in ways not feasible with a normal bed. Pain episodes frequently require frequent changes in body position which cannot be achieved with a normal bed.  Patient suffers from rheumatoid arthritis which impairs their ability to perform daily activities like toileting, dressing, bathing in the home. A cane/walker/crutch will not resolve issue with performing activities of daily living. A wheelchair will allow patient to safely perform daily activities. Patient can safely propel the wheelchair in the home or has a caregiver who can provide assistance.  Plan: --Per guidelines, methotrexate is safe to be taken and started before/during zostavax administration; family made aware --f/u with rheumatology on Nov 8th --place order for home health for PT/OT, hospital bed, and wheelchair.

## 2016-09-11 NOTE — Progress Notes (Signed)
   CC: weakness  HPI:  [name] is a 82 y.o. with a PMH of rheumatoid arthritis, hypertension, and Hashimoto's thyroiditis presenting to clinic for continued weakness and immobility due to pain.   Patient is accompanied by 2 daughters who help take care of her. Patient states she has been wheelchair and bed bound for about a year now, and this was preceded by a steady decline in functionality due to pain from her rheumatoid arthritis. She and family deny any inciting event one year ago that might have worsened her condition. Patient is using second-hand wheelchair and is using a regular twin sized bed with bed-side commode.   RA: Patient has been established with rheumatology, Dr. Corliss Skains, who started the patient on methotrexate last week. Patient has not been taking it yet due to fear that it might increase risk of shingles with the Zostavax that she is due to have in about 2 weeks.   Patient and family endorse a "recent" onset of bowel and bladder incontinence, without specifying a timeline. Patient states that she does not have sensation that she has to use the restroom. She is on colace which she takes everyday even though she has 2 BM's per day.   Patient with one episode of A fib which occurred during left heart catheterization in July 2017 from which she spontaneously converted. She was placed on Eliquis. Has since followed up with cardiology who placed a 48hr halter monitor; this did not show any a fib episodes so her Eliquis was discontinued.   She was previously residing in [place] but after her hospitalization this summer, she remained in [place] with her daughters for closer care. She has recently been able to switch her medicare to [place].   Please see problem based Assessment and Plan for status of patients chronic conditions.  Past Medical History:  Diagnosis Date  . Hypertension   . Hypothyroidism   . Pneumonia ~ 1943  . Rheumatoid arthritis (HCC)    "all  over; hands, feet, knees, joints" (05/31/2016)  . Thyroid disease     Review of Systems:   Review of Systems  Constitutional: Negative for chills, fever and weight loss.  Gastrointestinal: Negative for abdominal pain, blood in stool, constipation, diarrhea, melena, nausea and vomiting.       Bowel incontinence  Genitourinary:       Bladder incontinence  Musculoskeletal: Positive for joint pain (generalized, but worst in knees bilaterally). Negative for falls.  Skin: Positive for rash (mainly on back and inner arms, intermittent).  Neurological: Positive for weakness. Negative for tingling and sensory change.    Physical Exam:  Vitals:   09/11/16 1030  BP: 140/85  Pulse: 88  Temp: 97.8 F (36.6 C)  TempSrc: Oral  SpO2: 99%   Physical Exam  Constitutional: NAD, pleasant CV: RRR, no murmurs, rubs or gallops appreciated, no edema in LE Resp: CTAB, no increased work of breathing, no wheezing or crackles appreciated Abd: soft, nondistended, nontender Skin: No appreciable rash or lesions MSK: Patient with 4/5 lower extremity strength; LE range of motion limited by pain in knees; sensation intact. Bilateral hands with swan neck deformities in fingers.  Assessment & Plan:   See Encounters Tab for problem based charting.   Patient seen with Dr. Cecilie Kicks, MD Internal Medicine PGY1

## 2016-09-11 NOTE — Patient Instructions (Addendum)
It was very nice meeting you all today!  Please stop taking the Colace and see if there are any changes in bowel movements.  You can start taking Methotrexate before the Zostavax; please keep your appointment with the rheumatologist on Nov 8th at 9:45am  I will get working on the home health, hospital bed, and wheelchair orders.  Please set up an appointment with Dr. Obie Dredge, your primary care provider, for end of November (29th).   I will call you with the results of the blood tests and let you know if we need to make any changes to the thyroid medication.

## 2016-09-11 NOTE — Assessment & Plan Note (Signed)
Patient with bowel and bladder incontinence not associated with urgency or stress. Patient continues to take colace daily despite regular 2 BM's daily. There seems to be micommunication about synthroid dosing which could also be contributing to the problem. Patient has weakness in lower extremities attributed to pain and underuse due to pain for the past year at least. She denies paraesthesias. On exam, she has 4/5 strength and sensation is intact in the LE bilaterally. There is concern for cauda equina syndrome due to her extensive RA and newer onset incontinence; no imaging of her back is present in our system.   Plan: --stop colace --adjust synthroid dosing as necessary based on TSH, T4 level --if symptoms persist at f/u in 4-6 weeks, will need rectal exam (patient agrees) for evaluation of rectal tone and the possibility for cauda equina syndrome.

## 2016-09-12 LAB — TSH: TSH: 2.35 u[IU]/mL (ref 0.450–4.500)

## 2016-09-12 LAB — T4, FREE: Free T4: 1.38 ng/dL (ref 0.82–1.77)

## 2016-09-14 DIAGNOSIS — M05861 Other rheumatoid arthritis with rheumatoid factor of right knee: Secondary | ICD-10-CM | POA: Diagnosis not present

## 2016-09-14 DIAGNOSIS — E063 Autoimmune thyroiditis: Secondary | ICD-10-CM | POA: Diagnosis not present

## 2016-09-14 DIAGNOSIS — I10 Essential (primary) hypertension: Secondary | ICD-10-CM | POA: Diagnosis not present

## 2016-09-14 DIAGNOSIS — M05862 Other rheumatoid arthritis with rheumatoid factor of left knee: Secondary | ICD-10-CM | POA: Diagnosis not present

## 2016-09-14 DIAGNOSIS — E039 Hypothyroidism, unspecified: Secondary | ICD-10-CM | POA: Diagnosis not present

## 2016-09-15 ENCOUNTER — Telehealth: Payer: Self-pay

## 2016-09-15 DIAGNOSIS — E039 Hypothyroidism, unspecified: Secondary | ICD-10-CM | POA: Diagnosis not present

## 2016-09-15 DIAGNOSIS — I10 Essential (primary) hypertension: Secondary | ICD-10-CM | POA: Diagnosis not present

## 2016-09-15 DIAGNOSIS — M05861 Other rheumatoid arthritis with rheumatoid factor of right knee: Secondary | ICD-10-CM | POA: Diagnosis not present

## 2016-09-15 DIAGNOSIS — E063 Autoimmune thyroiditis: Secondary | ICD-10-CM | POA: Diagnosis not present

## 2016-09-15 DIAGNOSIS — M05862 Other rheumatoid arthritis with rheumatoid factor of left knee: Secondary | ICD-10-CM | POA: Diagnosis not present

## 2016-09-15 NOTE — Telephone Encounter (Signed)
Teddy from [facility] requesting VO. Please call back.

## 2016-09-18 ENCOUNTER — Telehealth: Payer: Self-pay

## 2016-09-18 DIAGNOSIS — M05861 Other rheumatoid arthritis with rheumatoid factor of right knee: Secondary | ICD-10-CM | POA: Diagnosis not present

## 2016-09-18 DIAGNOSIS — M05862 Other rheumatoid arthritis with rheumatoid factor of left knee: Secondary | ICD-10-CM | POA: Diagnosis not present

## 2016-09-18 DIAGNOSIS — E063 Autoimmune thyroiditis: Secondary | ICD-10-CM | POA: Diagnosis not present

## 2016-09-18 DIAGNOSIS — I10 Essential (primary) hypertension: Secondary | ICD-10-CM | POA: Diagnosis not present

## 2016-09-18 DIAGNOSIS — E039 Hypothyroidism, unspecified: Secondary | ICD-10-CM | POA: Diagnosis not present

## 2016-09-18 NOTE — Telephone Encounter (Signed)
Don from [facility] requesting VO. Please call back.  

## 2016-09-18 NOTE — Telephone Encounter (Signed)
Rtc, no answer, lm for rtc 

## 2016-09-19 ENCOUNTER — Telehealth: Payer: Self-pay

## 2016-09-19 NOTE — Telephone Encounter (Signed)
I agree Ms. Yeater would benefit from home OT and PT

## 2016-09-19 NOTE — Telephone Encounter (Signed)
Verbal approval was given for OT and PT to work on gait training, safety in the home, pain issues. Do you agree?

## 2016-09-20 DIAGNOSIS — M05862 Other rheumatoid arthritis with rheumatoid factor of left knee: Secondary | ICD-10-CM | POA: Diagnosis not present

## 2016-09-20 DIAGNOSIS — E063 Autoimmune thyroiditis: Secondary | ICD-10-CM | POA: Diagnosis not present

## 2016-09-20 DIAGNOSIS — E039 Hypothyroidism, unspecified: Secondary | ICD-10-CM | POA: Diagnosis not present

## 2016-09-20 DIAGNOSIS — M05861 Other rheumatoid arthritis with rheumatoid factor of right knee: Secondary | ICD-10-CM | POA: Diagnosis not present

## 2016-09-20 DIAGNOSIS — I10 Essential (primary) hypertension: Secondary | ICD-10-CM | POA: Diagnosis not present

## 2016-09-20 NOTE — Telephone Encounter (Signed)
Spoke w/ PT for PT and OT

## 2016-09-21 ENCOUNTER — Telehealth: Payer: Self-pay

## 2016-09-21 DIAGNOSIS — E039 Hypothyroidism, unspecified: Secondary | ICD-10-CM | POA: Diagnosis not present

## 2016-09-21 DIAGNOSIS — M05861 Other rheumatoid arthritis with rheumatoid factor of right knee: Secondary | ICD-10-CM | POA: Diagnosis not present

## 2016-09-21 DIAGNOSIS — I10 Essential (primary) hypertension: Secondary | ICD-10-CM | POA: Diagnosis not present

## 2016-09-21 DIAGNOSIS — M05862 Other rheumatoid arthritis with rheumatoid factor of left knee: Secondary | ICD-10-CM | POA: Diagnosis not present

## 2016-09-21 DIAGNOSIS — E063 Autoimmune thyroiditis: Secondary | ICD-10-CM | POA: Diagnosis not present

## 2016-09-21 NOTE — Telephone Encounter (Signed)
Don from [facility] needs to speak with Myriam Jacobson. Please call back.

## 2016-09-21 NOTE — Telephone Encounter (Signed)
Rtc, lm for rtc 

## 2016-09-22 ENCOUNTER — Telehealth: Payer: Self-pay

## 2016-09-22 DIAGNOSIS — M05862 Other rheumatoid arthritis with rheumatoid factor of left knee: Secondary | ICD-10-CM | POA: Diagnosis not present

## 2016-09-22 DIAGNOSIS — I10 Essential (primary) hypertension: Secondary | ICD-10-CM | POA: Diagnosis not present

## 2016-09-22 DIAGNOSIS — M05861 Other rheumatoid arthritis with rheumatoid factor of right knee: Secondary | ICD-10-CM | POA: Diagnosis not present

## 2016-09-22 DIAGNOSIS — E063 Autoimmune thyroiditis: Secondary | ICD-10-CM | POA: Diagnosis not present

## 2016-09-22 DIAGNOSIS — E039 Hypothyroidism, unspecified: Secondary | ICD-10-CM | POA: Diagnosis not present

## 2016-09-22 NOTE — Telephone Encounter (Signed)
Don from [facility] requesting VO. Please call back.

## 2016-09-22 NOTE — Telephone Encounter (Signed)
Called Don at [facility] - no answer; left message to return our call.

## 2016-09-24 NOTE — Progress Notes (Signed)
I saw and evaluated the patient.  I personally confirmed the key portions of Dr. Kandice Hams history and exam and reviewed pertinent patient test results.  The assessment, diagnosis, and plan were formulated together and I agree with the documentation in the resident's note.  The TSH was in the normal range despite taking the 50 mcg of synthroid daily.  I communicated with the provider who reportedly decreased the dose of the synthroid.  Apparently, there must have been some miscommunication between the provider and the caretakers as the dose was not supposed to be decreased.  Since the TSH remains in the normal range on the lower dose we will formally change the dose to this and recheck the TSH in approximately 2-3 months at which time further adjustments in the dose can be made as appropriate.

## 2016-09-25 DIAGNOSIS — M05862 Other rheumatoid arthritis with rheumatoid factor of left knee: Secondary | ICD-10-CM | POA: Diagnosis not present

## 2016-09-25 DIAGNOSIS — E039 Hypothyroidism, unspecified: Secondary | ICD-10-CM | POA: Diagnosis not present

## 2016-09-25 DIAGNOSIS — M05861 Other rheumatoid arthritis with rheumatoid factor of right knee: Secondary | ICD-10-CM | POA: Diagnosis not present

## 2016-09-25 DIAGNOSIS — E063 Autoimmune thyroiditis: Secondary | ICD-10-CM | POA: Diagnosis not present

## 2016-09-25 DIAGNOSIS — I10 Essential (primary) hypertension: Secondary | ICD-10-CM | POA: Diagnosis not present

## 2016-09-27 ENCOUNTER — Telehealth: Payer: Self-pay | Admitting: Internal Medicine

## 2016-09-27 DIAGNOSIS — M05762 Rheumatoid arthritis with rheumatoid factor of left knee without organ or systems involvement: Principal | ICD-10-CM

## 2016-09-27 DIAGNOSIS — M05862 Other rheumatoid arthritis with rheumatoid factor of left knee: Secondary | ICD-10-CM | POA: Diagnosis not present

## 2016-09-27 DIAGNOSIS — I10 Essential (primary) hypertension: Secondary | ICD-10-CM | POA: Diagnosis not present

## 2016-09-27 DIAGNOSIS — E039 Hypothyroidism, unspecified: Secondary | ICD-10-CM | POA: Diagnosis not present

## 2016-09-27 DIAGNOSIS — M05861 Other rheumatoid arthritis with rheumatoid factor of right knee: Secondary | ICD-10-CM | POA: Diagnosis not present

## 2016-09-27 DIAGNOSIS — M05761 Rheumatoid arthritis with rheumatoid factor of right knee without organ or systems involvement: Secondary | ICD-10-CM

## 2016-09-27 DIAGNOSIS — E063 Autoimmune thyroiditis: Secondary | ICD-10-CM | POA: Diagnosis not present

## 2016-09-27 NOTE — Telephone Encounter (Signed)
Don from [facility] requesting for patient to have a DME order for a Drop down Arm Rest Commode.  Pls note that patient is unable to use a standard commode and pt requires a sliding board for the transfer.  Please advise.

## 2016-09-28 DIAGNOSIS — I10 Essential (primary) hypertension: Secondary | ICD-10-CM | POA: Diagnosis not present

## 2016-09-28 DIAGNOSIS — M05862 Other rheumatoid arthritis with rheumatoid factor of left knee: Secondary | ICD-10-CM | POA: Diagnosis not present

## 2016-09-28 DIAGNOSIS — M05861 Other rheumatoid arthritis with rheumatoid factor of right knee: Secondary | ICD-10-CM | POA: Diagnosis not present

## 2016-09-28 DIAGNOSIS — E039 Hypothyroidism, unspecified: Secondary | ICD-10-CM | POA: Diagnosis not present

## 2016-09-28 DIAGNOSIS — E063 Autoimmune thyroiditis: Secondary | ICD-10-CM | POA: Diagnosis not present

## 2016-09-29 DIAGNOSIS — M6281 Muscle weakness (generalized): Secondary | ICD-10-CM | POA: Diagnosis not present

## 2016-09-29 DIAGNOSIS — M069 Rheumatoid arthritis, unspecified: Secondary | ICD-10-CM | POA: Diagnosis not present

## 2016-10-02 ENCOUNTER — Other Ambulatory Visit: Payer: Self-pay | Admitting: Internal Medicine

## 2016-10-02 DIAGNOSIS — I4891 Unspecified atrial fibrillation: Secondary | ICD-10-CM

## 2016-10-03 DIAGNOSIS — E039 Hypothyroidism, unspecified: Secondary | ICD-10-CM | POA: Diagnosis not present

## 2016-10-03 DIAGNOSIS — E063 Autoimmune thyroiditis: Secondary | ICD-10-CM | POA: Diagnosis not present

## 2016-10-03 DIAGNOSIS — M05862 Other rheumatoid arthritis with rheumatoid factor of left knee: Secondary | ICD-10-CM | POA: Diagnosis not present

## 2016-10-03 DIAGNOSIS — M05861 Other rheumatoid arthritis with rheumatoid factor of right knee: Secondary | ICD-10-CM | POA: Diagnosis not present

## 2016-10-03 DIAGNOSIS — I10 Essential (primary) hypertension: Secondary | ICD-10-CM | POA: Diagnosis not present

## 2016-10-04 ENCOUNTER — Ambulatory Visit: Payer: PRIVATE HEALTH INSURANCE | Admitting: Rheumatology

## 2016-10-06 ENCOUNTER — Telehealth: Payer: Self-pay

## 2016-10-06 NOTE — Telephone Encounter (Signed)
Whitney Mayer,OT from [facility] - stated he needs drop down arm rest commode to complete his visit w/pt. He called [facility] who stated they have not received the order. I will check w/ front office. Also need VO for "Move visit until next week when commode arrive to teach transfer"; Verbal order given. If not ok let me know. Thanks

## 2016-10-06 NOTE — Telephone Encounter (Signed)
Whitney Mayer from [facility] requesting VO.

## 2016-10-09 DIAGNOSIS — M05862 Other rheumatoid arthritis with rheumatoid factor of left knee: Secondary | ICD-10-CM | POA: Diagnosis not present

## 2016-10-09 DIAGNOSIS — E063 Autoimmune thyroiditis: Secondary | ICD-10-CM | POA: Diagnosis not present

## 2016-10-09 DIAGNOSIS — E039 Hypothyroidism, unspecified: Secondary | ICD-10-CM | POA: Diagnosis not present

## 2016-10-09 DIAGNOSIS — M05861 Other rheumatoid arthritis with rheumatoid factor of right knee: Secondary | ICD-10-CM | POA: Diagnosis not present

## 2016-10-09 DIAGNOSIS — I10 Essential (primary) hypertension: Secondary | ICD-10-CM | POA: Diagnosis not present

## 2016-10-09 NOTE — Telephone Encounter (Signed)
Ok, I had placed a DME order for the drop down arm rest commode early last week please let me know if anything needs to be changed.  Thank you

## 2016-10-11 DIAGNOSIS — M6281 Muscle weakness (generalized): Secondary | ICD-10-CM | POA: Diagnosis not present

## 2016-10-11 DIAGNOSIS — M069 Rheumatoid arthritis, unspecified: Secondary | ICD-10-CM | POA: Diagnosis not present

## 2016-10-12 DIAGNOSIS — Z79899 Other long term (current) drug therapy: Secondary | ICD-10-CM | POA: Insufficient documentation

## 2016-10-12 DIAGNOSIS — E063 Autoimmune thyroiditis: Secondary | ICD-10-CM | POA: Diagnosis not present

## 2016-10-12 DIAGNOSIS — M05861 Other rheumatoid arthritis with rheumatoid factor of right knee: Secondary | ICD-10-CM | POA: Diagnosis not present

## 2016-10-12 DIAGNOSIS — M05862 Other rheumatoid arthritis with rheumatoid factor of left knee: Secondary | ICD-10-CM | POA: Diagnosis not present

## 2016-10-12 DIAGNOSIS — I10 Essential (primary) hypertension: Secondary | ICD-10-CM | POA: Diagnosis not present

## 2016-10-12 DIAGNOSIS — E039 Hypothyroidism, unspecified: Secondary | ICD-10-CM | POA: Diagnosis not present

## 2016-10-12 NOTE — Progress Notes (Deleted)
Office Visit Note  Patient: Whitney Mayer             Date of Birth: 07-30-1932           MRN: [id number]             PCP: Eulah Pont, MD Referring: Eulah Pont, MD Visit Date: 10/17/2016 Occupation: @GUAROCC @    Subjective:  No chief complaint on file.   History of Present Illness: Whitney Mayer is a 82 y.o. female ***   Activities of Daily Living:  Patient reports morning stiffness for *** {minute/hour:19697}.   Patient {ACTIONS;DENIES/REPORTS:21021675::"Denies"} nocturnal pain.  Difficulty dressing/grooming: {ACTIONS;DENIES/REPORTS:21021675::"Denies"} Difficulty climbing stairs: {ACTIONS;DENIES/REPORTS:21021675::"Denies"} Difficulty getting out of chair: {ACTIONS;DENIES/REPORTS:21021675::"Denies"} Difficulty using hands for taps, buttons, cutlery, and/or writing: {ACTIONS;DENIES/REPORTS:21021675::"Denies"}   No Rheumatology ROS completed.   PMFS History:  Patient Active Problem List   Diagnosis Date Noted  . No blood products 09/11/2016  . Bowel and bladder incontinence 09/11/2016  . Weakness 07/04/2016  . Hypertension 06/08/2016  . Osteoporosis 06/08/2016  . Hashimoto's thyroiditis 06/01/2016  . Thoracic ascending aortic aneurysm (HCC) 06/01/2016  . RA (rheumatoid arthritis) (HCC) 06/01/2016  . Coronary artery calcification 06/01/2016    Past Medical History:  Diagnosis Date  . Hypertension   . Hypothyroidism   . Pneumonia ~ 1943  . Rheumatoid arthritis (HCC)    "all over; hands, feet, knees, joints" (05/31/2016)  . Thyroid disease     Family History  Problem Relation Age of Onset  . Stroke Mother   . Kidney disease Brother    Past Surgical History:  Procedure Laterality Date  . CARDIAC CATHETERIZATION N/A 06/02/2016   Procedure: Left Heart Cath and Coronary Angiography;  Surgeon: 08/03/2016, MD;  Location: [facility] INVASIVE CV LAB;  Service: Cardiovascular;  Laterality: N/A;  . CARDIOVERSION N/A 06/02/2016   Procedure: CARDIOVERSION;  Surgeon: 08/03/2016, MD;  Location: [facility] OR;  Service: Cardiovascular;  Laterality: N/A;  . DILATION AND CURETTAGE OF UTERUS  1980s  . TUBAL LIGATION  1960s   Social History   Social History Narrative  . No narrative on file     Objective: Vital Signs: LMP  (LMP Unknown)    Physical Exam   Musculoskeletal Exam: ***  CDAI Exam: No CDAI exam completed.    Investigation: Findings:    August 2017:  CBC showed elevated WBC count of 11.6.  Comprehensive metabolic panel was normal.  Vitamin D 35.5.  TSH and T4 were normal.  Rheumatoid factor 17.6.  CCP more than 250.  C-reactive protein 7.2.  CK was 23.  TB Gold in August was negative.  Hepatitis panel in August was negative.    X-rays of bilateral hands, 2 views Oct 2017, showed all MCP narrowing and subluxation.  All PIP and DIP narrowing.   She has erosive changes in all of her MCP and PIP joints, intercarpal and radiocarpal joints with severe narrowing of intercarpal and radiocarpal joints bilaterally, consistent with rheumatoid arthritis.  Bilateral feet x-rays showed osteopenia, subluxations of all MTP joints, with erosive changes.  All PIP joints were narrowed with erosive changes.  Also, metatarsal joint narrowing was noted and right subtalar joint was completed fused and left subtalar joint was narrowed.  Bilateral elbow joint x-rays showed severe joint space narrowing and erosive changes consistent with rheumatoid arthritis.  Hip joint x-rays showed bilateral mild joint space narrowing.  Knee joint x-rays showed severe medial and lateral compartment narrowing and severe patellofemoral narrowing consistent with end-stage rheumatoid arthritis.  Imaging: No results found.  Speciality Comments: No specialty comments available.    Procedures:  No procedures performed Allergies: Patient has no known allergies.   Assessment / Plan: Visit Diagnoses: No diagnosis found.    Orders: No orders of the defined types were placed in this  encounter.  No orders of the defined types were placed in this encounter.   Face-to-face time spent with patient was *** minutes. 50% of time was spent in counseling and coordination of care.  Follow-Up Instructions: No Follow-up on file.   Amy Littrell, RT

## 2016-10-13 DIAGNOSIS — M05861 Other rheumatoid arthritis with rheumatoid factor of right knee: Secondary | ICD-10-CM | POA: Diagnosis not present

## 2016-10-13 DIAGNOSIS — I10 Essential (primary) hypertension: Secondary | ICD-10-CM | POA: Diagnosis not present

## 2016-10-13 DIAGNOSIS — M05862 Other rheumatoid arthritis with rheumatoid factor of left knee: Secondary | ICD-10-CM | POA: Diagnosis not present

## 2016-10-13 DIAGNOSIS — E039 Hypothyroidism, unspecified: Secondary | ICD-10-CM | POA: Diagnosis not present

## 2016-10-13 DIAGNOSIS — E063 Autoimmune thyroiditis: Secondary | ICD-10-CM | POA: Diagnosis not present

## 2016-10-17 ENCOUNTER — Ambulatory Visit: Payer: Commercial Managed Care - HMO | Admitting: Rheumatology

## 2016-10-25 ENCOUNTER — Ambulatory Visit (INDEPENDENT_AMBULATORY_CARE_PROVIDER_SITE_OTHER): Payer: Commercial Managed Care - HMO | Admitting: Internal Medicine

## 2016-10-25 ENCOUNTER — Encounter: Payer: Self-pay | Admitting: Internal Medicine

## 2016-10-25 VITALS — BP 150/89 | HR 91 | Temp 97.9°F | Ht 62.0 in

## 2016-10-25 DIAGNOSIS — Z79899 Other long term (current) drug therapy: Secondary | ICD-10-CM | POA: Diagnosis not present

## 2016-10-25 DIAGNOSIS — M069 Rheumatoid arthritis, unspecified: Secondary | ICD-10-CM | POA: Diagnosis not present

## 2016-10-25 DIAGNOSIS — L509 Urticaria, unspecified: Secondary | ICD-10-CM

## 2016-10-25 DIAGNOSIS — Z993 Dependence on wheelchair: Secondary | ICD-10-CM

## 2016-10-25 DIAGNOSIS — I4891 Unspecified atrial fibrillation: Secondary | ICD-10-CM

## 2016-10-25 DIAGNOSIS — M8000XD Age-related osteoporosis with current pathological fracture, unspecified site, subsequent encounter for fracture with routine healing: Secondary | ICD-10-CM

## 2016-10-25 DIAGNOSIS — L299 Pruritus, unspecified: Secondary | ICD-10-CM | POA: Diagnosis not present

## 2016-10-25 DIAGNOSIS — I10 Essential (primary) hypertension: Secondary | ICD-10-CM | POA: Diagnosis not present

## 2016-10-25 DIAGNOSIS — I712 Thoracic aortic aneurysm, without rupture: Secondary | ICD-10-CM

## 2016-10-25 DIAGNOSIS — R0609 Other forms of dyspnea: Secondary | ICD-10-CM

## 2016-10-25 DIAGNOSIS — I7121 Aneurysm of the ascending aorta, without rupture: Secondary | ICD-10-CM

## 2016-10-25 DIAGNOSIS — M810 Age-related osteoporosis without current pathological fracture: Secondary | ICD-10-CM

## 2016-10-25 NOTE — Progress Notes (Signed)
   CC: itchy skin    HPI: [name] is a 82 y.o. with past medical history as outlined below who presents to clinic for follow up of new concern for itchy skin and dyspnea on exertion. She recently moved to [place] from [place] in June. In august she developed an area of itchy skin over her lower back and hives on her face. She has been using the same soaps and laundry detergents as she had in [place] and has not been eating any new types of foods. She has tried benadryl as needed and hydrocortisone but these have not relived the itch. She request to see an allergist.  For the past two weeks she has had dyspnea on exertion while transferring from her bed to bedside commode. She is wheelchair bound due to her crippling rheumatoid arthritis and has been transferring on her own for a year but has never felt short of breath in the past. In October she began methotrexate therapy. She denies shortness of breath at rest, cough, orthopnea or chest pain. She has no history of tobacco use.  Please see problem list for status of the pt's chronic medical problems.  Past Medical History:  Diagnosis Date  . Hypertension   . Hypothyroidism   . Pneumonia ~ 1943  . Rheumatoid arthritis (HCC)    "all over; hands, feet, knees, joints" (05/31/2016)  . Thyroid disease     Review of Systems:  Please see each problem below for a pertinent review of systems. ROS  Physical Exam:  Vitals:   10/25/16 1524  BP: (!) 150/89  Pulse: 91  Temp: 97.9 F (36.6 C)  TempSrc: Oral  SpO2: 93%  Height: 5\' 2"  (1.575 m)   Physical Exam  Constitutional: She is oriented to person, place, and time. She appears well-developed and well-nourished. No distress.  Eyes: Conjunctivae are normal. No scleral icterus.  Cardiovascular: Normal rate and regular rhythm.   No murmur heard. Pulmonary/Chest: Effort normal. No respiratory distress. She has no wheezes. She has no rales.  Musculoskeletal:  Swan neck  deformity of all fingers of bilateral hands   Neurological: She is alert and oriented to person, place, and time.  Skin: She is not diaphoretic.  Two hives over right cheekbone  Excoriations over lower back    Assessment & Plan:   See Encounters Tab for problem based charting.   Patient seen with Dr. 

## 2016-10-25 NOTE — Patient Instructions (Addendum)
It was a pleasure to meet you today Whitney Mayer!   For your itchy skin- continue taking benadryl, try taking this every evening as long as it doesn't make you feel groggy in the morning I have sent in a referral for you to see an allergy doctor, please call our clinic if you have not heard back to schedule an appointment in the next few weeks.   Call the clinic if you have any new difficulty breathing  Schedule a follow up appointment to be seen in the clinic next week for your difficulty breathing   Schedule a follow up appointment to see Dr. Obie Dredge in 3 months    Ronchas (Hives) Las ronchas (urticaria) son reas enrojecidas e hinchadas en la piel que ocasionan picazn. Las ronchas pueden Physicist, medical parte del cuerpo y Control and instrumentation engineer. Pueden ser del tamao de la punta de un bolgrafo o mucho ms grandes. Las ronchas suelen mejorar en el transcurso de 24horas (ronchas [place]). En otros casos, aparecen ronchas nuevas despus de que las viejas desaparecen. Esto puede continuar durante [employer] o semanas (ronchas crnicas). La causa de las ronchas es una reaccin del cuerpo a un agente irritante o a algo que le produce alergia (factor desencadenante). Puede tener ronchas inmediatamente despus de estar cerca de un factor desencadenante u horas ms tarde. No se transmiten de persona a persona (no son contagiosas). Las ronchas pueden empeorar si se las rasca, si hace ejercicio o si est preocupado (estrs emocional). CUIDADOS EN EL HOGAR Medicamentos   Tome o aplique los medicamentos de venta libre y los recetados solamente como se lo haya indicado el mdico.  Si le recetaron un antibitico, tmelo como se lo haya indicado el mdico. No deje de tomar los antibiticos aunque comience a sentirse mejor. Cuidado de la piel   Aplique paos fros y hmedos (compresas fras) en las zonas hinchadas, enrojecidas y Advertising copywriter.  No se rasque la piel. No se frote la piel. Instrucciones  generales   No se duche ni tome baos de inmersin con agua caliente. Podra empeorar la picazn.  No use ropa ajustada.  Aplquese pantalla solar y use ropa que le cubra la piel cuando est al [place].  Evite los factores desencadenantes que le causan las ronchas. Lleve un registro para Education officer, environmental un seguimiento de aquello que le produce ronchas. [place] los siguientes datos:  Los medicamentos que toma.  Lo que usted come y bebe.  Los productos que [place] en la piel.  Concurra a todas las visitas de control como se lo haya indicado el mdico. Esto es importante. SOLICITE AYUDA SI:  Los sntomas no mejoran con los medicamentos.  Le duelen las articulaciones o estas se inflaman. SOLICITE AYUDA DE INMEDIATO SI:  Tiene fiebre.  Siente dolor en el abdomen.  Tiene la lengua o los labios hinchados.  Tiene los prpados hinchados.  Siente el pecho o la garganta cerrados.  Tiene problemas para respirar o tragar. Estos sntomas pueden Customer service manager. No espere hasta que los sntomas desaparezcan. Solicite atencin mdica de inmediato. Comunquese con el servicio de emergencias de su localidad (911 en los [place]). No conduzca por sus propios medios [employer].  Esta informacin no tiene Theme park manager el consejo del mdico. Asegrese de hacerle al mdico cualquier pregunta que tenga. Document Released: 05/14/2012 Document Revised: 03/06/2016 Document Reviewed: 09/01/2015 Elsevier Interactive Patient Education  2017 [employer].

## 2016-10-29 DIAGNOSIS — R0600 Dyspnea, unspecified: Secondary | ICD-10-CM | POA: Insufficient documentation

## 2016-10-29 DIAGNOSIS — M069 Rheumatoid arthritis, unspecified: Secondary | ICD-10-CM | POA: Diagnosis not present

## 2016-10-29 DIAGNOSIS — L299 Pruritus, unspecified: Secondary | ICD-10-CM | POA: Insufficient documentation

## 2016-10-29 DIAGNOSIS — M6281 Muscle weakness (generalized): Secondary | ICD-10-CM | POA: Diagnosis not present

## 2016-10-29 DIAGNOSIS — R0689 Other abnormalities of breathing: Secondary | ICD-10-CM

## 2016-10-29 NOTE — Assessment & Plan Note (Signed)
For the past two weeks she has had dyspnea on exertion while transferring from her bed to bedside commode. She is wheelchair bound due to her crippling rheumatoid arthritis and has been transferring on her own for a year but has never felt short of breath in the past. In October she began methotrexate therapy. She denies shortness of breath at rest, cough, orthopnea or chest pain. She has no history of tobacco use.  On exam she has SpO2 93 %, this oxygen saturation reading may be related to her RA hand deformities but when seen in clinic in October she has SpO2 99%. I am concerned that this dyspnea on exertion may be related to her methotrexate therapy or rheumatic lung disease.   Will call us if symptoms worsen or she develops any new symptoms  Return to clinic next week to reevaluate

## 2016-10-29 NOTE — Assessment & Plan Note (Signed)
She recently moved to [place] from [place] in June. In august she developed an area of itchy skin over her lower back and hives on her face. She denies difficulty breathing. She has been using the same soaps and laundry detergents as she had in [place] and has not been eating any new types of foods. She has tried benadryl as needed and hydrocortisone but these have not relived the itch. She request to see an allergist.   On exam she has excoriations over her back and two hives on her face. I suspect that this new urticaria may be related to environmental allergy given her recent move.   Placed referral to allergist for skin testing  Continue benadryl daily qHS

## 2016-10-30 MED ORDER — CHOLECALCIFEROL 25 MCG (1000 UT) PO TABS: 1000.0000 [IU] | ORAL_TABLET | Freq: Every day | ORAL | 3 refills | Status: DC

## 2016-10-30 MED ORDER — CALCIUM CARBONATE 1250 (500 CA) MG PO TABS: 1.0000 | ORAL_TABLET | Freq: Every day | ORAL | 3 refills | Status: DC

## 2016-10-30 MED ORDER — DILTIAZEM HCL ER COATED BEADS 120 MG PO CP24: 120.0000 mg | ORAL_CAPSULE | Freq: Every day | ORAL | 3 refills | Status: DC

## 2016-10-30 MED ORDER — LOSARTAN POTASSIUM 100 MG PO TABS: 50.0000 mg | ORAL_TABLET | Freq: Every day | ORAL | 3 refills | Status: DC

## 2016-10-30 MED ORDER — LEVOTHYROXINE SODIUM 100 MCG PO TABS: 50.0000 ug | ORAL_TABLET | Freq: Every day | ORAL | 3 refills | Status: DC

## 2016-10-30 NOTE — Assessment & Plan Note (Signed)
She is at goal <150/90 on current blood pressure regiment.  BP Readings from Last 3 Encounters:  10/25/16 (!) 150/89  09/11/16 140/85  07/24/16 124/82   Refilled Losartan 50 mg daily  Refilled Diltiazem XR 120 mg

## 2016-10-30 NOTE — Assessment & Plan Note (Signed)
Reports no new fractures. Has been compliant with alendronate, Vitamin D, and Calcium supplementation.   Continue Alendronate 70 mg weekly  Refilled Cholecalciferol 1000 units daily  Refilled Calcium carbonate 1250 mg daily

## 2016-10-30 NOTE — Progress Notes (Signed)
Office Visit Note  Patient: Whitney Mayer             Date of Birth: [birth year]           MRN: [id number]             PCP: Eulah Pont, MD Referring: Eulah Pont, MD Visit Date: 10/31/2016 Occupation: @GUAROCC @    Subjective:  Pain and swelling in multiple joints   History of Present Illness: Ozella Comins is a 82 y.o. female with history of end-stage sero positive rheumatoid arthritis. She states she started methotrexate about 5 weeks ago. She waited to get her pneumococcal and shingles vaccine. She also had eye surgery and she wanted to recover from that. She states the methotrexate is working for her with decreased pain and swelling in her joints although she's been experiencing some shortness of breath for which she's been seen by Dr. 97. She states in his office her oxygen saturation was low. She has some discomfort in her hip joints while she is sitting. She states when she tries to exercise she experiences pain in most of the joints mainly in her knee joints. Not much swelling currently. Her daughter accompanies today states that she's noticed  decreased discomfort since she's been on methotrexate.  Activities of Daily Living:  Patient reports morning stiffness for 1 hour.   Patient Reports nocturnal pain.  Difficulty dressing/grooming: Reports Difficulty climbing stairs: Reports Difficulty getting out of chair: Reports Difficulty using hands for taps, buttons, cutlery, and/or writing: Reports   Review of Systems  Constitutional: Positive for fatigue. Negative for night sweats, weight gain, weight loss and weakness.  HENT: Negative for mouth sores, trouble swallowing, trouble swallowing, mouth dryness and nose dryness.   Eyes: Negative for pain, redness and visual disturbance.  Respiratory: Positive for shortness of breath. Negative for cough and difficulty breathing.   Cardiovascular: Negative for chest pain, palpitations, hypertension, irregular heartbeat and swelling in  legs/feet.  Gastrointestinal: Negative for blood in stool, constipation and diarrhea.  Endocrine: Negative for increased urination.  Genitourinary: Negative for vaginal dryness.  Musculoskeletal: Positive for arthralgias, joint pain and morning stiffness. Negative for joint swelling, myalgias, muscle weakness, muscle tenderness and myalgias.  Skin: Negative for color change, rash, hair loss, skin tightness, ulcers and sensitivity to sunlight.  Allergic/Immunologic: Negative for susceptible to infections.  Neurological: Negative for dizziness, memory loss and night sweats.  Hematological: Negative for swollen glands.  Psychiatric/Behavioral: Positive for sleep disturbance. Negative for depressed mood. The patient is not nervous/anxious.     PMFS History:  Patient Active Problem List   Diagnosis Date Noted  . Hives 10/29/2016  . Dyspnea on exertion 10/29/2016  . High risk medication use 10/12/2016  . No blood products 09/11/2016  . Hypertension 06/08/2016  . Osteoporosis 06/08/2016  . Hashimoto's thyroiditis 06/01/2016  . Thoracic ascending aortic aneurysm (HCC) 06/01/2016  . Rheumatoid arthritis with rheumatoid factor of multiple sites without organ or systems involvement (HCC) 06/01/2016  . Coronary artery calcification 06/01/2016    Past Medical History:  Diagnosis Date  . Hypertension   . Hypothyroidism   . Pneumonia ~ 1943  . Rheumatoid arthritis (HCC)    "all over; hands, feet, knees, joints" (05/31/2016)  . Thyroid disease     Family History  Problem Relation Age of Onset  . Stroke Mother   . Kidney disease Brother    Past Surgical History:  Procedure Laterality Date  . CARDIAC CATHETERIZATION N/A 06/02/2016   Procedure: Left Heart Cath and  Coronary Angiography;  Surgeon: Lyn Records, MD;  Location: [facility] INVASIVE CV LAB;  Service: Cardiovascular;  Laterality: N/A;  . CARDIOVERSION N/A 06/02/2016   Procedure: CARDIOVERSION;  Surgeon: Runell Gess, MD;  Location: [facility] OR;   Service: Cardiovascular;  Laterality: N/A;  . DILATION AND CURETTAGE OF UTERUS  1980s  . TUBAL LIGATION  1960s   Social History   Social History Narrative  . No narrative on file     Objective: Vital Signs: BP 124/76   Pulse 80   Resp 14   Ht 5\' 2"  (1.575 m)   LMP  (LMP Unknown)    Physical Exam  Constitutional: She is oriented to person, place, and time. She appears well-developed and well-nourished.  HENT:  Head: Normocephalic and atraumatic.  Eyes: Conjunctivae and EOM are normal.  Neck: Normal range of motion.  Cardiovascular: Normal rate, regular rhythm, normal heart sounds and intact distal pulses.   Pulmonary/Chest: Effort normal and breath sounds normal.  Crackles in right lung base  Abdominal: Soft. Bowel sounds are normal.  Lymphadenopathy:    She has no cervical adenopathy.  Neurological: She is alert and oriented to person, place, and time.  Skin: Skin is warm and dry. Capillary refill takes less than 2 seconds.  Psychiatric: She has a normal mood and affect. Her behavior is normal.  Nursing note and vitals reviewed.    Musculoskeletal Exam: C-spine limited range of motion, she has severe kyphosis, she has some limitation in range of motion of her lumbar spine. Her shoulder joint is abduction is better from 110 to 80 bilaterally her elbow joint contractures almost resolved at 5 she had no synovitis in her wrists joints today. She has subluxation of all MCP joints some with no synovitis. She has limited range of motion of her hip joints. She is contractures in her bilateral knee joints with no synovitis but some warmth in her left knee joint. Her ankle joints are good range of motion.  CDAI Exam: CDAI Homunculus Exam:   Tenderness:  RUE: glenohumeral LUE: glenohumeral Right hand: 2nd MCP, 3rd MCP, 4th MCP and 5th MCP Left hand: 1st MCP, 2nd MCP, 3rd MCP, 4th MCP and 5th MCP RLE: tibiofemoral LLE: tibiofemoral  Swelling:  LLE: tibiofemoral  Joint  Counts:  CDAI Tender Joint count: 13 CDAI Swollen Joint count: 1  Global Assessments:  Patient Global Assessment: 7 Provider Global Assessment: 7  CDAI Calculated Score: 28    Investigation: Findings:   August 2017:  CBC showed elevated WBC count of 11.6.  Comprehensive metabolic panel was normal.  Vitamin D 35.5.  TSH and T4 were normal.  Rheumatoid factor 17.6.  CCP more than 250.  C-reactive protein 7.2.  CK was 23.  TB Gold in August was negative.  Hepatitis panel  was negative.   X-rays of bilateral hands, 2 views today, showed all MCP narrowing and subluxation.  All PIP and DIP narrowing.  She has erosive changes in all of her MCP and PIP joints, intercarpal and radiocarpal joints with severe narrowing of intercarpal and radiocarpal joints bilaterally, consistent with rheumatoid arthritis.  Bilateral feet x-rays showed osteopenia, subluxations of all MTP joints, with erosive changes.  All PIP joints were narrowed with erosive changes.  Also, metatarsal joint narrowing was noted and right subtalar joint was completed fused and left subtalar joint was narrowed.  Bilateral elbow joint x-rays showed severe joint space narrowing and erosive changes consistent with rheumatoid arthritis.  Hip joint x-rays showed bilateral mild joint  space narrowing.  Knee joint x-rays showed severe medial and lateral compartment narrowing and severe patellofemoral narrowing consistent with end-stage rheumatoid arthritis.  09/11/2016 chest [facility] at [facility] recently, which showed T10 compression fracture, enlarged thoracic aorta, possible aneurism, and linear density in the left lung base because of scarring or atelectasis.   Rheumatoid arthritis with positive rheumatoid factor and positive CCP.  She has severe disease.  She is wheelchair-bound.  She has subluxation of all of her MCP joints, swan neck deformities in her hands, synovitis in her wrist joints, and subluxation of all of her MTP joints.   She has contractures in her elbows, knees and ankle joints and is basically in a wheelchair.         Imaging: No results found.  Speciality Comments: No specialty comments available.    Procedures:  No procedures performed Allergies: Patient has no known allergies.   Assessment / Plan:     Visit Diagnoses: Rheumatoid arthritis  - Positive rheumatoid factor positive anti-CCP severe erosive disease with subluxation of MCPs and MTPs, wheelchair bound -she has severe end-stage rheumatoid arthritis she is some unable to do much. She responded very well to methotrexate. Her contractures have improved in her synovitis is resolved almost. Her daughter reports decreased pain.  Shortness of breath: Patient reports some shortness of breath for the last 2 weeks. She was seen by PCP and had pulse ox of 93% at the office. I'm uncertain if it's due to underlying rheumatoid arthritis or due to methotrexate. I spoke with Dr. Marchelle Gearing today he kindly agreed to see her sometimes this week. I will schedule high-resolution CT to evaluate this further. In the meantime I will advised to discontinue methotrexate we will hold it for right now. Plan: CT Chest High Resolution, Ambulatory referral to Pulmonology  High risk medication use - Methotrexate 4 tablets by mouth every week, folic acid 1 mg by mouth daily - Plan: CBC with Differential/Platelet, COMPLETE METABOLIC PANEL WITH GFR. We will hold methotrexate until further pulmonary workup.  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 CT Chest High Resolution  . CBC with  Differential/Platelet  . COMPLETE METABOLIC PANEL WITH GFR  . Ambulatory referral to Pulmonology   No orders of the defined types were placed in this encounter.   Face-to-face time spent with patient was 40 minutes. 50% of time was spent in counseling and coordination of care.  Follow-Up Instructions: Return in about 4 weeks (around 11/28/2016).   Pollyann Savoy, MD

## 2016-10-31 ENCOUNTER — Encounter: Payer: Self-pay | Admitting: Rheumatology

## 2016-10-31 ENCOUNTER — Ambulatory Visit (INDEPENDENT_AMBULATORY_CARE_PROVIDER_SITE_OTHER): Payer: Commercial Managed Care - HMO | Admitting: Rheumatology

## 2016-10-31 ENCOUNTER — Telehealth: Payer: Self-pay | Admitting: Internal Medicine

## 2016-10-31 VITALS — BP 124/76 | HR 80 | Resp 14 | Ht 62.0 in

## 2016-10-31 DIAGNOSIS — M0579 Rheumatoid arthritis with rheumatoid factor of multiple sites without organ or systems involvement: Secondary | ICD-10-CM | POA: Diagnosis not present

## 2016-10-31 DIAGNOSIS — M05721 Rheumatoid arthritis with rheumatoid factor of right elbow without organ or systems involvement: Secondary | ICD-10-CM | POA: Diagnosis not present

## 2016-10-31 DIAGNOSIS — M05722 Rheumatoid arthritis with rheumatoid factor of left elbow without organ or systems involvement: Secondary | ICD-10-CM | POA: Diagnosis not present

## 2016-10-31 DIAGNOSIS — M170 Bilateral primary osteoarthritis of knee: Secondary | ICD-10-CM | POA: Diagnosis not present

## 2016-10-31 DIAGNOSIS — M8000XD Age-related osteoporosis with current pathological fracture, unspecified site, subsequent encounter for fracture with routine healing: Secondary | ICD-10-CM | POA: Diagnosis not present

## 2016-10-31 DIAGNOSIS — M160 Bilateral primary osteoarthritis of hip: Secondary | ICD-10-CM | POA: Diagnosis not present

## 2016-10-31 DIAGNOSIS — Z79899 Other long term (current) drug therapy: Secondary | ICD-10-CM

## 2016-10-31 DIAGNOSIS — R0602 Shortness of breath: Secondary | ICD-10-CM

## 2016-10-31 LAB — CBC WITH DIFFERENTIAL/PLATELET
BASOS ABS: 0 {cells}/uL (ref 0–200)
BASOS PCT: 0 %
EOS ABS: 408 {cells}/uL (ref 15–500)
Eosinophils Relative: 4 %
HCT: 36.5 % (ref 35.0–45.0)
HEMOGLOBIN: 11.5 g/dL — AB (ref 11.7–15.5)
LYMPHS ABS: 1734 {cells}/uL (ref 850–3900)
Lymphocytes Relative: 17 %
MCH: 26.3 pg — AB (ref 27.0–33.0)
MCHC: 31.5 g/dL — ABNORMAL LOW (ref 32.0–36.0)
MCV: 83.3 fL (ref 80.0–100.0)
MONO ABS: 918 {cells}/uL (ref 200–950)
MPV: 9.2 fL (ref 7.5–12.5)
Monocytes Relative: 9 %
NEUTROS ABS: 7140 {cells}/uL (ref 1500–7800)
Neutrophils Relative %: 70 %
PLATELETS: 303 10*3/uL (ref 140–400)
RBC: 4.38 MIL/uL (ref 3.80–5.10)
RDW: 17.2 % — ABNORMAL HIGH (ref 11.0–15.0)
WBC: 10.2 10*3/uL (ref 3.8–10.8)

## 2016-10-31 LAB — COMPLETE METABOLIC PANEL WITH GFR
ALBUMIN: 3.4 g/dL — AB (ref 3.6–5.1)
ALK PHOS: 73 U/L (ref 33–130)
ALT: 10 U/L (ref 6–29)
AST: 15 U/L (ref 10–35)
BILIRUBIN TOTAL: 0.5 mg/dL (ref 0.2–1.2)
BUN: 18 mg/dL (ref 7–25)
CO2: 27 mmol/L (ref 20–31)
CREATININE: 0.67 mg/dL (ref 0.60–0.88)
Calcium: 9 mg/dL (ref 8.6–10.4)
Chloride: 103 mmol/L (ref 98–110)
GFR, EST NON AFRICAN AMERICAN: 82 mL/min (ref >=60)
GLUCOSE: 84 mg/dL (ref 65–99)
Potassium: 4.1 mmol/L (ref 3.5–5.3)
Sodium: 137 mmol/L (ref 135–146)
TOTAL PROTEIN: 7 g/dL (ref 6.1–8.1)

## 2016-10-31 NOTE — Progress Notes (Signed)
Internal Medicine Clinic Attending  I saw and evaluated the patient.  I personally confirmed the key portions of the history and exam documented by Dr. Blum and I reviewed pertinent patient test results.  The assessment, diagnosis, and plan were formulated together and I agree with the documentation in the resident's note. 

## 2016-10-31 NOTE — Telephone Encounter (Signed)
ELise  Dr Drusilla Kanner just called 10:28 AM 10/31/2016 - see if you can get patient in this week for dyspnea following methotrexate Rx [phone] daughter [name]. Dr D will get HRCT this week.  Dr. Kalman Shan, M.D., [facility]P Pulmonary and Critical Care Medicine Staff Physician [facility] Pulmonary and Critical Care Pager: [phone], If no answer or between  15:00h - 7:00h: call [phone]  10/31/2016 10:28 AM

## 2016-10-31 NOTE — Patient Instructions (Signed)
Hold methotrexate

## 2016-10-31 NOTE — Progress Notes (Signed)
Rheumatology Medication Review by a Pharmacist Does the patient feel that his/her medications are working for him/her?  Patient started methotrexate 10 mg (4 tablets) weekly approximately five weeks ago.  She reports some improvement in joint pain since initiation of methotrexate. Has the patient been experiencing any side effects to the medications prescribed?  Yes - Patient reports she has been experiencing slight shortness of breath since starting methotrexate.  Patient also reports she went to Dr. Tamela Mayer office last week and reports her oxygen saturation was low.   Does the patient have any problems obtaining medications?  No   Pharmacist comments:  Whitney Mayer is a pleasant 82 yo who presents for follow up of her rheumatoid arthritis.  Patient reports taking methotrexate 10 mg weekly.  She states she started the medication 5 weeks ago as she waited until after her eye surgery and until after she got the pneumonia, influenza, and shingles vaccine.  Patient reports some shortness of breath.  Will discuss with Dr. Corliss Mayer.  Patient has not had standing labs since initiation of methotrexate.  Will order standing labs for today.  Patient denied any other medication related questions.     Whitney Mayer, Pharm.D., BCPS Clinical Pharmacist Pager: [phone] Phone: [phone] 10/31/2016 10:07 AM

## 2016-11-01 NOTE — Progress Notes (Signed)
Labs normal.

## 2016-11-01 NOTE — Telephone Encounter (Signed)
MR - Pt has not yet been scheduled for CT chest. Do you still want pt seen this week?

## 2016-11-02 ENCOUNTER — Telehealth (INDEPENDENT_AMBULATORY_CARE_PROVIDER_SITE_OTHER): Payer: Self-pay | Admitting: *Deleted

## 2016-11-02 NOTE — Telephone Encounter (Signed)
Dr Corliss Skains was keen I see her soon. See if you can get her in 11/03/16

## 2016-11-02 NOTE — Telephone Encounter (Signed)
Called and spoke to pt's [facility] POA and was advised pt is unavailable and cannot come in till later next week. MR you are not in office next week. Appt made with MR the following week on 12.20.17. She verbalized understanding.   Will send to MR as FYI.

## 2016-11-02 NOTE — Telephone Encounter (Signed)
Pt has appt tomorrow Dec 8 at 8am arrival 745am at [facility] radiology, I tried calling pt left message with granddaughter to return my call and then contacted her Daughter Magda and left message on cell phone to return my call for appt informatin

## 2016-11-02 NOTE — Telephone Encounter (Signed)
lmtcb for pt. - will need to schedule appt with MR tomorrow, pt will need to arrive no later than 840 (ok to DB 0900).

## 2016-11-02 NOTE — Telephone Encounter (Signed)
Pt daughter Jovita Kussmaul called back aware of appt for tomorrow but wants to reschedule for sometime next week, I explained to her that SD wanted her to have done ASAP but states had family emergency and can not do it this week. I gave her number to call and reschedule.

## 2016-11-03 ENCOUNTER — Ambulatory Visit (HOSPITAL_COMMUNITY): Payer: Commercial Managed Care - HMO

## 2016-11-06 NOTE — Telephone Encounter (Signed)
Thank you so much, I appreciate the note.

## 2016-11-09 DIAGNOSIS — I10 Essential (primary) hypertension: Secondary | ICD-10-CM | POA: Diagnosis not present

## 2016-11-09 DIAGNOSIS — M05862 Other rheumatoid arthritis with rheumatoid factor of left knee: Secondary | ICD-10-CM | POA: Diagnosis not present

## 2016-11-09 DIAGNOSIS — M05861 Other rheumatoid arthritis with rheumatoid factor of right knee: Secondary | ICD-10-CM | POA: Diagnosis not present

## 2016-11-09 DIAGNOSIS — E063 Autoimmune thyroiditis: Secondary | ICD-10-CM | POA: Diagnosis not present

## 2016-11-09 DIAGNOSIS — E039 Hypothyroidism, unspecified: Secondary | ICD-10-CM | POA: Diagnosis not present

## 2016-11-10 ENCOUNTER — Ambulatory Visit (HOSPITAL_COMMUNITY)
Admission: RE | Admit: 2016-11-10 | Discharge: 2016-11-10 | Disposition: A | Discharge status: Home / Self Care | Payer: Commercial Managed Care - HMO | Source: Ambulatory Visit | Attending: Rheumatology | Admitting: Rheumatology

## 2016-11-10 DIAGNOSIS — M069 Rheumatoid arthritis, unspecified: Secondary | ICD-10-CM | POA: Diagnosis not present

## 2016-11-10 DIAGNOSIS — R918 Other nonspecific abnormal finding of lung field: Secondary | ICD-10-CM | POA: Insufficient documentation

## 2016-11-10 DIAGNOSIS — M0579 Rheumatoid arthritis with rheumatoid factor of multiple sites without organ or systems involvement: Secondary | ICD-10-CM | POA: Diagnosis not present

## 2016-11-10 DIAGNOSIS — Z79899 Other long term (current) drug therapy: Secondary | ICD-10-CM | POA: Insufficient documentation

## 2016-11-10 DIAGNOSIS — R0602 Shortness of breath: Secondary | ICD-10-CM | POA: Diagnosis not present

## 2016-11-10 DIAGNOSIS — I700 Atherosclerosis of aorta: Secondary | ICD-10-CM | POA: Insufficient documentation

## 2016-11-10 DIAGNOSIS — I712 Thoracic aortic aneurysm, without rupture: Secondary | ICD-10-CM | POA: Diagnosis not present

## 2016-11-10 DIAGNOSIS — M6281 Muscle weakness (generalized): Secondary | ICD-10-CM | POA: Diagnosis not present

## 2016-11-10 NOTE — Progress Notes (Signed)
Please notify patient that the CT scan before it to Dr. Marchelle Gearing and she should keep appointment with Dr. Marchelle Gearing as a scheduled. Cc report to Dr. Marchelle Gearing

## 2016-11-15 ENCOUNTER — Institutional Professional Consult (permissible substitution): Payer: Commercial Managed Care - HMO | Admitting: Internal Medicine

## 2016-11-15 ENCOUNTER — Other Ambulatory Visit: Payer: Self-pay | Admitting: Rheumatology

## 2016-11-15 ENCOUNTER — Ambulatory Visit (INDEPENDENT_AMBULATORY_CARE_PROVIDER_SITE_OTHER): Payer: Commercial Managed Care - HMO | Admitting: Internal Medicine

## 2016-11-15 ENCOUNTER — Encounter: Payer: Self-pay | Admitting: Internal Medicine

## 2016-11-15 VITALS — BP 126/76 | HR 93

## 2016-11-15 DIAGNOSIS — R0689 Other abnormalities of breathing: Secondary | ICD-10-CM

## 2016-11-15 DIAGNOSIS — M069 Rheumatoid arthritis, unspecified: Secondary | ICD-10-CM | POA: Diagnosis not present

## 2016-11-15 DIAGNOSIS — R918 Other nonspecific abnormal finding of lung field: Secondary | ICD-10-CM | POA: Diagnosis not present

## 2016-11-15 DIAGNOSIS — R938 Abnormal findings on diagnostic imaging of other specified body structures: Secondary | ICD-10-CM | POA: Diagnosis not present

## 2016-11-15 DIAGNOSIS — R0600 Dyspnea, unspecified: Secondary | ICD-10-CM

## 2016-11-15 DIAGNOSIS — R9389 Abnormal findings on diagnostic imaging of other specified body structures: Secondary | ICD-10-CM

## 2016-11-15 NOTE — Telephone Encounter (Signed)
Last Visit: 10/31/16 Next visit: 11/30/16 Labs: 10/31/16 WNL  Okay to refill Folic Acid?

## 2016-11-15 NOTE — Telephone Encounter (Signed)
ok 

## 2016-11-15 NOTE — Patient Instructions (Addendum)
ICD-9-CM ICD-10-CM   1. Pulmonary infiltrates 793.19 R91.8   2. Dyspnea and respiratory abnormality 786.09 R06.00     R06.89   3. Abnormal screening computed tomography (CT) of chest 793.2 R93.8   4. Rheumatoid arthritis involving multiple sites, unspecified rheumatoid factor presence (HCC) 714.0 M06.9     Plan  for bronchoscopy with bronchoalveolar lavage in January 2018 - 12/07/2016 AM at [place]  - NPO for 6h before test, ok to take regular meds but no blood thinners  FU 1- 2 weeks after that

## 2016-11-15 NOTE — Progress Notes (Signed)
 Subjective:    Patient ID: Whitney Mayer, female    DOB: 08/08/1932, 82 y.o.   MRN: [id number]  HPI    OV 11/15/2016  Chief Complaint  Patient presents with  . Advice Only    referred by Dr. Devishwar after ct chest on 12/15    82-year-old female accompanied by her daughter Whitney Mayer. They're originally from [place]. In July 2017 patient was visiting her granddaughter's graduation and she got hospitalized for atypical chest pain and underwent cardiac catheterization. She also had brief atrial fibrillation for which she was on anticoagulation for a few months. CT chest shows some atelectasis or pulmonary embolism was ruled out. I personally visualized the CT chest. Subsequently she does not have any shortness of breath. It is to be noted that at baseline she has debilitating rheumatoid arthritis for many decades. At least for the last 7 years she's had significant deformity in her hands. Her daughter believes this was because of poor specialty care in [place]. Therefore for the last year or so she has been wheelchair-bound. She requires help from family members decided to transfer from the chair to the bed. She is able to lie flat except that her knees have to be flexed. She is able to eat by herself although with difficulty because of a deformed fingers. She is in significant pain and also has itching because of her rheumatoid arthritis. The itching though might not be related to rheumatoid arthritis and she has an allergy evaluation pending. Subsequently after immigrating in July 2017 to [place], [place] she establish with rheumatologist Dr. Deeveshwar was started on methotrexate 10 mg once a week. She says that shortly after starting methotrexate daughter started noticing dyspnea particularly during transfers to the family members are helping her out. There is no associated wheezing or cough. She then saw Dr dEveshwar 10/31/2016 at which time the methotrexate was stopped. She  started noticing improvement in dyspnea  according to her daughter Whitney Mayer. Since then dyspnea is resolved. She did have follow-up CT chest high resolution 11/10/2016 and this shows some increase peribronchial nodularity consistent with Mycobacterium avium complex that is increased since July 2017. However the basal atelectasis is resolved.  Patient and her daughter Whitney Mayer report significant pain from her rheumatoid arthritis and a very desperate for pain relief with advanced disease modifying agents of rheumatoid arthritis so that she can improve her quality of life.   IMPRESSION: 1. Pulmonary parenchymal pattern of peribronchial thickening, mild bronchiectasis and peribronchovascular nodularity is likely mildly progressive from 05/31/2016 and indicative of mycobacterium avium complex. 2. No evidence of fibrotic interstitial lung disease. 3. 4.3 cm ascending aortic aneurysm. Recommend annual imaging followup by CTA or MRA. This recommendation follows 2010 ACCF/AHA/AATS/ACR/ASA/SCA/SCAI/SIR/STS/SVM Guidelines for the Diagnosis and Management of Patients with Thoracic Aortic Disease. Circulation. 2010; 121: e266-e369. 4. Aortic atherosclerosis (ICD10-170.0). Coronary artery calcification.   Electronically Signed   By: Melinda  Blietz M.D.   On: 11/10/2016 11:23   has a past medical history of Hypertension; Hypothyroidism; Pneumonia (~ 1943); Rheumatoid arthritis (HCC); and Thyroid disease.   reports that she has never smoked. She has never used smokeless tobacco.  Past Surgical History:  Procedure Laterality Date  . CARDIAC CATHETERIZATION N/A 06/02/2016   Procedure: Left Heart Cath and Coronary Angiography;  Surgeon: Henry W Smith, MD;  Location: [facility];  Service: Cardiovascular;  Laterality: N/A;  . CARDIOVERSION N/A 06/02/2016   Procedure: CARDIOVERSION;  Surgeon: Jonathan J Berry, MD;  Location: [facility] OR;    Service: Cardiovascular;  Laterality: N/A;  . DILATION AND  CURETTAGE OF UTERUS  1980s  . TUBAL LIGATION  1960s    No Known Allergies  Immunization History  Administered Date(s) Administered  . Influenza-Unspecified 08/29/2016  . Pneumococcal Conjugate-13 08/29/2016    Family History  Problem Relation Age of Onset  . Stroke Mother   . Kidney disease Brother      Current Outpatient Prescriptions:  .  alendronate (FOSAMAX) 70 MG tablet, Take 1 tablet (70 mg total) by mouth once a week. Take with a full glass of water on an empty stomach., Disp: 52 tablet, Rfl: 0 .  calcium carbonate (OS-CAL - DOSED IN MG OF ELEMENTAL CALCIUM) 1250 (500 Ca) MG tablet, Take 1 tablet (500 mg of elemental calcium total) by mouth daily with breakfast., Disp: 90 tablet, Rfl: 3 .  Cholecalciferol 1000 units tablet, Take 1 tablet (1,000 Units total) by mouth daily., Disp: 90 tablet, Rfl: 3 .  diltiazem (CARTIA XT) 120 MG 24 hr capsule, Take 1 capsule (120 mg total) by mouth daily., Disp: 90 capsule, Rfl: 3 .  feeding supplement, ENSURE ENLIVE, (ENSURE ENLIVE) LIQD, Take 237 mLs by mouth 2 (two) times daily between meals., Disp: 237 mL, Rfl: 12 .  folic acid (FOLVITE) 1 MG tablet, Take 1 mg by mouth daily., Disp: , Rfl:  .  levothyroxine (SYNTHROID, LEVOTHROID) 100 MCG tablet, Take 0.5 tablets (50 mcg total) by mouth daily before breakfast., Disp: 90 tablet, Rfl: 3 .  losartan (COZAAR) 100 MG tablet, Take 0.5 tablets (50 mg total) by mouth daily., Disp: 90 tablet, Rfl: 3 .  methotrexate 2.5 MG tablet, Take 10 mg by mouth once a week., Disp: , Rfl:    Review of Systems  Constitutional: Negative for fever and unexpected weight change.  HENT: Positive for postnasal drip. Negative for congestion, dental problem, ear pain, nosebleeds, rhinorrhea, sinus pressure, sneezing, sore throat and trouble swallowing.   Eyes: Negative for redness and itching.  Respiratory: Positive for cough and shortness of breath. Negative for chest tightness and wheezing.   Cardiovascular:  Negative for palpitations and leg swelling.  Gastrointestinal: Negative for nausea and vomiting.  Genitourinary: Negative for dysuria.  Musculoskeletal: Negative for joint swelling.  Skin: Negative for rash.  Neurological: Negative for headaches.  Hematological: Does not bruise/bleed easily.  Psychiatric/Behavioral: Negative for dysphoric mood. The patient is not nervous/anxious.        Objective:   Physical Exam  Constitutional: She is oriented to person, place, and time. No distress.  Frail female sitting in the wheelchair  HENT:  Head: Normocephalic and atraumatic.  Right Ear: External ear normal.  Left Ear: External ear normal.  Mouth/Throat: Oropharynx is clear and moist. No oropharyngeal exudate.  No neck nodes  Eyes: Conjunctivae and EOM are normal. Pupils are equal, round, and reactive to light. Right eye exhibits no discharge. Left eye exhibits no discharge. No scleral icterus.  Neck: Normal range of motion. Neck supple. No JVD present. No tracheal deviation present. No thyromegaly present.  Cardiovascular: Normal rate, regular rhythm, normal heart sounds and intact distal pulses.  Exam reveals no gallop and no friction rub.   No murmur heard. Pulmonary/Chest: Effort normal and breath sounds normal. No respiratory distress. She has no wheezes. She has no rales. She exhibits no tenderness.  Significantly kyphotic  Abdominal: Soft. Bowel sounds are normal. She exhibits no distension and no mass. There is no tenderness. There is no rebound and no guarding.  Soft  Musculoskeletal: She   exhibits tenderness and deformity. She exhibits no edema.  Significant disabling rheumatoid arthritis present  Lymphadenopathy:    She has no cervical adenopathy.  Neurological: She is alert and oriented to person, place, and time. She has normal reflexes. No cranial nerve deficit. She exhibits normal muscle tone. Coordination normal.  Skin: Skin is warm and dry. No rash noted. She is not  diaphoretic. No erythema. No pallor.  Psychiatric: She has a normal mood and affect. Her behavior is normal. Judgment and thought content normal.  Vitals reviewed.   Vitals:   11/15/16 0956  BP: 126/76  Pulse: 93  SpO2: 95%    Estimated body mass index is 20.12 kg/m as calculated from the following:   Height as of 07/24/16: 5' 2" (1.575 m).   Weight as of 07/24/16: 110 lb (49.9 kg).       Assessment & Plan:     ICD-9-CM ICD-10-CM   1. Pulmonary infiltrates 793.19 R91.8   2. Dyspnea and respiratory abnormality 786.09 R06.00     R06.89   3. Abnormal screening computed tomography (CT) of chest 793.2 R93.8   4. Rheumatoid arthritis involving multiple sites, unspecified rheumatoid factor presence (HCC) 714.0 M06.9     I agree with the radiologist's finding that these infiltrates are suggestive of mycobacterium avium complex. Normally this is asymptomatic. I wonder if the presence of methotrexate activated this making her a little bit more symptomatic. At this point in time her dyspnea is better after she came off methotrexate. The problem is that she needs significant pain relief and the only way possible with advanced rheumatoid arthritis is with agents that can suppress the immune system. I do not think what she had was methotrexate related lung toxicity which can take years to develop and at higher doses.  Because of the need to immunosuppressed and treat her pain and the need to understand what these pulmonary infiltrates which seem to worsen in the last 6 months a bronchoscopy is indicated. She would need bronchoscopy with lavage at the minimum. Any procedure higher than that such as biopsy would be risky. We discussed about her ability to lie flat and she believes she can. Infection underwent cardiac catheterization without any problems. We discussed that we would need to rule out opportunistic infections and if this is okay she can continue with immunosuppressive therapy   Risks of  pneumothorax, hemothorax, sedation/anesthesia complications such as cardiac or respiratory arrest or hypotension, stroke and bleeding all explained. Benefits of diagnosis but limitations of non-diagnosis also explained. Patient verbalized understanding and wished to proceed.    Future Appointments Date Time Provider Department Center  11/30/2016 2:30 PM Shaili Deveshwar, MD [facility] None   Bronchoscopy scheduled for 12/07/2016 at [facility] at 8 AM - will only do lavage.    Dr. Valeen Borys, M.D., F.C.C.P Pulmonary and Critical Care Medicine Staff Physician [facility] Pager: [phone], If no answer or between  15:00h - 7:00h: call [phone]  11/15/2016 10:35 AM     

## 2016-11-17 DIAGNOSIS — L509 Urticaria, unspecified: Secondary | ICD-10-CM | POA: Diagnosis not present

## 2016-11-29 DIAGNOSIS — M170 Bilateral primary osteoarthritis of knee: Secondary | ICD-10-CM | POA: Insufficient documentation

## 2016-11-29 DIAGNOSIS — M24562 Contracture, left knee: Secondary | ICD-10-CM

## 2016-11-29 DIAGNOSIS — M24561 Contracture, right knee: Secondary | ICD-10-CM | POA: Insufficient documentation

## 2016-11-29 DIAGNOSIS — M6281 Muscle weakness (generalized): Secondary | ICD-10-CM | POA: Diagnosis not present

## 2016-11-29 DIAGNOSIS — M160 Bilateral primary osteoarthritis of hip: Secondary | ICD-10-CM | POA: Insufficient documentation

## 2016-11-29 DIAGNOSIS — M069 Rheumatoid arthritis, unspecified: Secondary | ICD-10-CM | POA: Diagnosis not present

## 2016-11-29 DIAGNOSIS — J479 Bronchiectasis, uncomplicated: Secondary | ICD-10-CM | POA: Insufficient documentation

## 2016-11-29 NOTE — Progress Notes (Signed)
Office Visit Note  Patient: Whitney Mayer             Date of Birth: 1932/10/17           MRN: [id number]             PCP: Whitney Noss, MD Referring: Whitney Noss, MD Visit Date: 11/30/2016 Occupation: @GUAROCC @    Subjective:  Pain hands   History of Present Illness: Whitney Mayer is a 83 y.o. female with history of sero positive rheumatoid arthritis. She has severe disease involving multiple joints. She was started on methotrexate but it was discontinued due to shortness of breath. The CT scan of the chest is suspicious of mycobacterium. She is scheduled to have bronchoscopy next week. She continues to have pain and discomfort in multiple joints. According to her daughter she was having severe pruritus for which she was prescribed Xyzal by her allergist. It has helped her a lot. An she's able to sleep through the night now. She denies shortness of breath now.  Activities of Daily Living:  Patient reports morning stiffness for 1 hour.   Patient Reports nocturnal pain.  Difficulty dressing/grooming: Reports Difficulty climbing stairs: Reports Difficulty getting out of chair: Reports Difficulty using hands for taps, buttons, cutlery, and/or writing: Reports   Review of Systems  Constitutional: Positive for fever. Negative for fatigue, night sweats, weight gain, weight loss and weakness.  HENT: Negative for mouth sores, trouble swallowing, trouble swallowing, mouth dryness and nose dryness.   Eyes: Negative for pain, redness, visual disturbance and dryness.  Respiratory: Negative for cough, shortness of breath and difficulty breathing.   Cardiovascular: Negative for chest pain, palpitations, hypertension, irregular heartbeat and swelling in legs/feet.  Gastrointestinal: Negative for blood in stool, constipation and diarrhea.  Endocrine: Negative for increased urination.  Genitourinary: Negative for vaginal dryness.  Musculoskeletal: Positive for arthralgias, joint pain, joint swelling  and morning stiffness. Negative for myalgias, muscle weakness, muscle tenderness and myalgias.  Skin: Negative for color change, rash, hair loss, skin tightness, ulcers and sensitivity to sunlight.  Allergic/Immunologic: Negative for susceptible to infections.  Neurological: Negative for dizziness, memory loss and night sweats.  Hematological: Negative for swollen glands.  Psychiatric/Behavioral: Negative for depressed mood and sleep disturbance. The patient is not nervous/anxious.     PMFS History:  Patient Active Problem List   Diagnosis Date Noted  . Contractures involving both knees 11/29/2016  . Primary osteoarthritis of both knees 11/29/2016  . Primary osteoarthritis of both hips 11/29/2016  . Bronchiectasis with acute exacerbation ([place]) 11/29/2016  . Pulmonary infiltrates 11/15/2016  . Abnormal screening computed tomography (CT) of chest 11/15/2016  . Hives 10/29/2016  . Dyspnea and respiratory abnormality 10/29/2016  . High risk medication use 10/12/2016  . No blood products 09/11/2016  . Hypertension 06/08/2016  . Osteoporosis 06/08/2016  . Hashimoto's thyroiditis 06/01/2016  . Thoracic ascending aortic aneurysm ([place]) 06/01/2016  . Rheumatoid arthritis involving multiple sites ([place]) 06/01/2016  . Coronary artery calcification 06/01/2016    Past Medical History:  Diagnosis Date  . Hypertension   . Hypothyroidism   . Pneumonia ~ 1943  . Rheumatoid arthritis ([place])    "all over; hands, feet, knees, joints" (05/31/2016)  . Thyroid disease     Family History  Problem Relation Age of Onset  . Stroke Mother   . Kidney disease Brother    Past Surgical History:  Procedure Laterality Date  . CARDIAC CATHETERIZATION N/A 06/02/2016   Procedure: Left Heart Cath and Coronary Angiography;  Surgeon: Whitney Crome, MD;  Location: [facility];  Service: Cardiovascular;  Laterality: N/A;  . CARDIOVERSION N/A 06/02/2016   Procedure: CARDIOVERSION;  Surgeon: Whitney Harp, MD;   Location: [facility];  Service: Cardiovascular;  Laterality: N/A;  . DILATION AND CURETTAGE OF UTERUS  1980s  . TUBAL LIGATION  1960s   Social History   Social History Narrative  . No narrative on file     Objective: Vital Signs: BP 130/86   Pulse 80   Resp 14   LMP  (LMP Unknown)    Physical Exam  Constitutional: She is oriented to person, place, and time. She appears well-developed and well-nourished.  HENT:  Head: Normocephalic and atraumatic.  Eyes: Conjunctivae and EOM are normal.  Neck: Normal range of motion.  Cardiovascular: Normal rate, regular rhythm, normal heart sounds and intact distal pulses.   Pulmonary/Chest: Effort normal and breath sounds normal.  Abdominal: Soft. Bowel sounds are normal.  Lymphadenopathy:    She has no cervical adenopathy.  Neurological: She is alert and oriented to person, place, and time.  Skin: Skin is warm and dry. Capillary refill takes less than 2 seconds.  Psychiatric: She has a normal mood and affect. Her behavior is normal.  Nursing note and vitals reviewed.    Musculoskeletal Exam: Limited range of motion of her C-spine. Her shoulder joint abduction was limited to about 80-90. She has marked kyphosis. She has contracture in her bilateral elbows right about 30 and left about 35 she has limited range of motion of bilateral wrist joints with some synovial thickening. She has synovial thickening and subluxation of all MCP joints and swan-neck deformity in her bilateral PIP joints. Hip joints were difficult to examine as she was in wheelchair she has limited extension of her bilateral knee joints. She had MTP PIP changes and subluxations.  CDAI Exam: CDAI Homunculus Exam:   Tenderness:  RUE: glenohumeral, ulnohumeral and radiohumeral and wrist LUE: glenohumeral, ulnohumeral and radiohumeral and wrist RLE: tibiofemoral LLE: tibiofemoral  Joint Counts:  CDAI Tender Joint count: 8 CDAI Swollen Joint count: 0  Global Assessments:    Patient Global Assessment: 5 Provider Global Assessment: 5  CDAI Calculated Score: 18    Investigation: Findings:  10/31/2016 CBC hemoglobin 11.5, CMP normal    Imaging: Ct Chest High Resolution  Result Date: 11/10/2016 CLINICAL DATA:  Rheumatoid arthritis, shortness of breath. EXAM: CT CHEST WITHOUT CONTRAST TECHNIQUE: Multidetector CT imaging of the chest was performed following the standard protocol without intravenous contrast. High resolution imaging of the lungs, as well as inspiratory and expiratory imaging, was performed. COMPARISON:  05/31/2016. FINDINGS: Cardiovascular: Atherosclerotic calcification of the arterial vasculature, including coronary arteries. Ascending aorta measures 4.3 cm. Heart size normal. No pericardial effusion. Mediastinum/Nodes: Mediastinal lymph nodes are not enlarged by CT size criteria. Hilar regions are difficult to definitively evaluate without IV contrast. Axillary lymph nodes are not enlarged by CT size criteria. Esophagus is grossly unremarkable. Lungs/Pleura: Minimal right apical scarring. Mid and lower lung zone predominant peribronchial thickening, mild bronchiectasis and peribronchovascular nodularity, similar to minimally progressive from 05/31/2016. No subpleural reticulation, traction bronchiectasis/ bronchiolectasis, ground-glass, architectural distortion or honeycombing. No pleural fluid. Airway is unremarkable. No air trapping. Upper Abdomen: Subcentimeter low-attenuation lesion in the liver is too small to characterize. Visualized portions of the liver, gallbladder, adrenal glands, kidneys, spleen, pancreas, stomach and bowel are otherwise grossly unremarkable. Musculoskeletal: No worrisome lytic or sclerotic lesions. Degenerative changes are seen in the spine and shoulders. Lower thoracic  compression deformity is unchanged. IMPRESSION: 1. Pulmonary parenchymal pattern of peribronchial thickening, mild bronchiectasis and peribronchovascular  nodularity is likely mildly progressive from 05/31/2016 and indicative of mycobacterium avium complex. 2. No evidence of fibrotic interstitial lung disease. 3. 4.3 cm ascending aortic aneurysm. Recommend annual imaging followup by CTA or MRA. This recommendation follows 2010 ACCF/AHA/AATS/ACR/ASA/SCA/SCAI/SIR/STS/SVM Guidelines for the Diagnosis and Management of Patients with Thoracic Aortic Disease. Circulation. 2010; 121: Z610-R604. 4. Aortic atherosclerosis (ICD10-170.0). Coronary artery calcification. Electronically Signed   By: Lorin Picket M.D.   On: 11/10/2016 11:23    Speciality Comments: No specialty comments available.    Procedures:  No procedures performed Allergies: Patient has no known allergies.   Assessment / Plan:     Visit Diagnoses: Rheumatoid arthritis involving multiple sites with positive rheumatoid factor (HCC) - Positive rheumatoid factor, positive CCP, severe disease with contractures and erosions. She does not have much synovitis on examination today although she has multiple contractures as described.  Contractures involving both knees - , Elbows, wrist joints, shoulder joints  Primary osteoarthritis of both knees - Severe end-stage due to osteoarthritis and rheumatoid arthritis overlap  Primary osteoarthritis of both hips  Age-related osteoporosis with current pathological fracture with routine healing, subsequent encounter. She is on Fosamax.  Bronchiectasis with acute exacerbation (HCC) - Possible Mycobacterium avium complex. She developed some shortness of breath after starting methotrexate which could be due to excess sedation of underlying infection. At this point I would hold off all DMARD therapy. She does not have much synovitis in much discomfort. She has end-stage burned-out rheumatoid arthritis. We'll continue to monitor her for right now. She is awaiting bronchoscopy which is scheduled next week.  High risk medication use - Stop methotrexate due to  exacerbation of pulmonary symptoms    Orders: No orders of the defined types were placed in this encounter.  No orders of the defined types were placed in this encounter.   Face-to-face time spent with patient was 30 minutes. 50% of time was spent in counseling and coordination of care.  Follow-Up Instructions: Return in about 3 months (around 02/28/2017) for Rheumatoid arthritis.   Bo Merino, MD

## 2016-11-30 ENCOUNTER — Ambulatory Visit (INDEPENDENT_AMBULATORY_CARE_PROVIDER_SITE_OTHER): Payer: Medicare HMO | Admitting: Rheumatology

## 2016-11-30 ENCOUNTER — Encounter: Payer: Self-pay | Admitting: Rheumatology

## 2016-11-30 VITALS — BP 130/86 | HR 80 | Resp 14

## 2016-11-30 DIAGNOSIS — M170 Bilateral primary osteoarthritis of knee: Secondary | ICD-10-CM | POA: Diagnosis not present

## 2016-11-30 DIAGNOSIS — M160 Bilateral primary osteoarthritis of hip: Secondary | ICD-10-CM | POA: Diagnosis not present

## 2016-11-30 DIAGNOSIS — M24562 Contracture, left knee: Secondary | ICD-10-CM | POA: Diagnosis not present

## 2016-11-30 DIAGNOSIS — M0579 Rheumatoid arthritis with rheumatoid factor of multiple sites without organ or systems involvement: Secondary | ICD-10-CM

## 2016-11-30 DIAGNOSIS — Z79899 Other long term (current) drug therapy: Secondary | ICD-10-CM

## 2016-11-30 DIAGNOSIS — M8000XD Age-related osteoporosis with current pathological fracture, unspecified site, subsequent encounter for fracture with routine healing: Secondary | ICD-10-CM

## 2016-11-30 DIAGNOSIS — J471 Bronchiectasis with (acute) exacerbation: Secondary | ICD-10-CM

## 2016-11-30 DIAGNOSIS — M24561 Contracture, right knee: Secondary | ICD-10-CM | POA: Diagnosis not present

## 2016-12-01 ENCOUNTER — Ambulatory Visit: Payer: Commercial Managed Care - HMO | Admitting: Adult Health

## 2016-12-07 ENCOUNTER — Encounter (HOSPITAL_COMMUNITY)
Admission: RE | Disposition: A | Discharge status: Home / Self Care | Payer: Self-pay | Source: Ambulatory Visit | Attending: Internal Medicine

## 2016-12-07 ENCOUNTER — Inpatient Hospital Stay (HOSPITAL_COMMUNITY)
Admission: RE | Admit: 2016-12-07 | Discharge: 2016-12-07 | Disposition: A | Discharge status: Home / Self Care | Payer: Commercial Managed Care - HMO | Source: Ambulatory Visit

## 2016-12-07 ENCOUNTER — Encounter (HOSPITAL_COMMUNITY): Payer: Self-pay | Admitting: Respiratory Therapy

## 2016-12-07 ENCOUNTER — Ambulatory Visit (HOSPITAL_COMMUNITY)
Admission: RE | Admit: 2016-12-07 | Discharge: 2016-12-07 | Disposition: A | Discharge status: Home / Self Care | Payer: Commercial Managed Care - HMO | Source: Ambulatory Visit | Attending: Internal Medicine | Admitting: Internal Medicine

## 2016-12-07 DIAGNOSIS — J479 Bronchiectasis, uncomplicated: Secondary | ICD-10-CM | POA: Diagnosis not present

## 2016-12-07 DIAGNOSIS — R918 Other nonspecific abnormal finding of lung field: Secondary | ICD-10-CM | POA: Diagnosis not present

## 2016-12-07 DIAGNOSIS — R6889 Other general symptoms and signs: Secondary | ICD-10-CM | POA: Diagnosis not present

## 2016-12-07 DIAGNOSIS — R0600 Dyspnea, unspecified: Secondary | ICD-10-CM | POA: Diagnosis not present

## 2016-12-07 DIAGNOSIS — M069 Rheumatoid arthritis, unspecified: Secondary | ICD-10-CM | POA: Diagnosis not present

## 2016-12-07 DIAGNOSIS — I2510 Atherosclerotic heart disease of native coronary artery without angina pectoris: Secondary | ICD-10-CM | POA: Diagnosis not present

## 2016-12-07 DIAGNOSIS — I700 Atherosclerosis of aorta: Secondary | ICD-10-CM | POA: Insufficient documentation

## 2016-12-07 DIAGNOSIS — I712 Thoracic aortic aneurysm, without rupture: Secondary | ICD-10-CM | POA: Diagnosis not present

## 2016-12-07 HISTORY — PX: VIDEO BRONCHOSCOPY: SHX5072

## 2016-12-07 LAB — BODY FLUID CELL COUNT WITH DIFFERENTIAL
Eos, Fluid: 1 %
Lymphs, Fluid: 1 %
MONOCYTE-MACROPHAGE-SEROUS FLUID: 1 % — AB (ref 50–90)
NEUTROPHIL FLUID: 97 % — AB (ref 0–25)
WBC FLUID: 235 uL (ref 0–1000)

## 2016-12-07 SURGERY — VIDEO BRONCHOSCOPY WITHOUT FLUORO
Anesthesia: Moderate Sedation | Laterality: Bilateral

## 2016-12-07 MED ORDER — MIDAZOLAM HCL 10 MG/2ML IJ SOLN
INTRAMUSCULAR | Status: DC | PRN
  Administered 2016-12-07 (×2): 1 mg via INTRAVENOUS

## 2016-12-07 MED ORDER — PHENYLEPHRINE HCL 0.25 % NA SOLN
NASAL | Status: DC | PRN
  Administered 2016-12-07: 2 via NASAL

## 2016-12-07 MED ORDER — LIDOCAINE HCL 2 % EX GEL: 1.0000 "application " | Freq: Once | CUTANEOUS | Status: DC

## 2016-12-07 MED ORDER — MIDAZOLAM HCL 5 MG/ML IJ SOLN
INTRAMUSCULAR | Status: AC
  Filled 2016-12-07: qty 2

## 2016-12-07 MED ORDER — LIDOCAINE HCL 1 % IJ SOLN
INTRAMUSCULAR | Status: DC | PRN
  Administered 2016-12-07: 6 mL via RESPIRATORY_TRACT

## 2016-12-07 MED ORDER — PHENYLEPHRINE HCL 0.25 % NA SOLN
1.0000 | Freq: Four times a day (QID) | NASAL | Status: DC | PRN
Start: 2016-12-07 — End: 2016-12-07

## 2016-12-07 MED ORDER — FENTANYL CITRATE (PF) 100 MCG/2ML IJ SOLN
INTRAMUSCULAR | Status: AC
  Filled 2016-12-07: qty 4

## 2016-12-07 MED ORDER — FENTANYL CITRATE (PF) 100 MCG/2ML IJ SOLN
INTRAMUSCULAR | Status: DC | PRN
  Administered 2016-12-07 (×2): 25 ug via INTRAVENOUS

## 2016-12-07 MED ORDER — SODIUM CHLORIDE 0.9 % IV SOLN
INTRAVENOUS | Status: DC
  Administered 2016-12-07: 08:00:00 via INTRAVENOUS

## 2016-12-07 MED ORDER — BUTAMBEN-TETRACAINE-BENZOCAINE 2-2-14 % EX AERO: 1.0000 | INHALATION_SPRAY | Freq: Once | CUTANEOUS | Status: DC

## 2016-12-07 MED ORDER — LIDOCAINE HCL 2 % EX GEL
CUTANEOUS | Status: DC | PRN
  Administered 2016-12-07: 1

## 2016-12-07 NOTE — Progress Notes (Signed)
Video Bronchoscopy done Intervention Bronchial washing done Procedure tolerated well 

## 2016-12-07 NOTE — Op Note (Signed)
Name:  Whitney Mayer MRN:  [id number] DOB:  26-Jul-1932  PROCEDURE NOTE  Procedure(s): Flexible bronchoscopy [phone]) Bronchial alveolar lavage [phone]) of the Right lower lobe    Indications: Pulmonary Infiltrates in Immunocompromised patient  Consent:  Procedure, benefits, risks and alternatives discussed.  Questions answered.  Consent obtained.  Anesthesia: Moderate Sedation  Procedure summary:  Appropriate equipment was assembled.  The patient was brought to the bronchoscopy suite and identified as Whitney Mayer.  Safety timeout was performed. The patient was placed supine on the operating table, moderate sedation administered by  Dr Chase Caller.   After the appropriate level of sedation was assured, flexible video bronchoscope was lubricated and inserted through the endotracheal tube.  Total of 10 mL of 1% Lidocaine were administered through the bronchoscope to augment general anesthesia.  Airway examination was performed on right side only to subsegmental level.  Minimal clear secretions were noted, mucosa appeared normal and no endobronchial lesions were identified. On left side airway exam was performed on main airway  Bronchial alveolar lavage of the right lower  lobe was performed with 80 mL of normal saline and return of 40 mL of fluid, after which the bronchoscope was withdrawn. Fluid color was dark grey with mucus. The bronchoscope was then withdrawn.  Post-procedure chest x-ray was not ordered.  Specimens sent: Bronchial alveolar lavage specimen of the right lower lobe for cell count, microbiology (PCP, AFB, Bacteria and funus) and cytology.  Complications:  No immediate complications were noted.  Hemodynamic parameters and oxygenation remained stable throughout the procedure.  Estimated blood loss:  none Dr. Brand Males, M.D., [facility].C.P Pulmonary and Critical Care Medicine Staff Physician [facility] Pager: [phone], If  no answer or between  15:00h - 7:00h: call [phone]  12/07/2016 8:59 AM

## 2016-12-07 NOTE — Discharge Instructions (Signed)
Flexible Bronchoscopy, Care After These instructions give you information on caring for yourself after your procedure. Your doctor may also give you more specific instructions. Call your doctor if you have any problems or questions after your procedure. Follow these instructions at home:  Do not eat or drink anything for 2 hours after your procedure. If you try to eat or drink before the medicine wears off, food or drink could go into your lungs. You could also burn yourself.  After 2 hours have passed and when you can cough and gag normally, you may eat soft food and drink liquids slowly.  The day after the test, you may eat your normal diet.  You may do your normal activities.  Keep all doctor visits. Get help right away if:  You get more and more short of breath.  You get light-headed.  You feel like you are going to pass out (faint).  You have chest pain.  You have new problems that worry you.  You cough up more than a little blood.  You cough up more blood than before. This information is not intended to replace advice given to you by your health care provider. Make sure you discuss any questions you have with your health care provider. Document Released: 09/10/2009 Document Revised: 04/20/2016 Document Reviewed: 07/18/2013 Elsevier Interactive Patient Education  2017 [place].  Nothing to eat or drink until  11:00  am today   12/07/2016   FOLLOWUP  Future Appointments Date Time Provider [place]  12/18/2016 10:15 AM Melvenia Needles, NP [facility] None  02/28/2017 1:45 PM Eliezer Lofts, PA-C [facility] None

## 2016-12-07 NOTE — H&P (View-Only) (Signed)
Subjective:    Patient ID: Whitney Mayer, female    DOB: [birth year], 83 y.o.   MRN: [id number]  HPI    OV 11/15/2016  Chief Complaint  Patient presents with  . Advice Only    referred by Dr. Garen Grams after ct chest on 12/15    83 year old female accompanied by her daughter Whitney Mayer. They're originally from [place]. In July 2017 patient was visiting her granddaughter's graduation and she got hospitalized for atypical chest pain and underwent cardiac catheterization. She also had brief atrial fibrillation for which she was on anticoagulation for a few months. CT chest shows some atelectasis or pulmonary embolism was ruled out. I personally visualized the CT chest. Subsequently she does not have any shortness of breath. It is to be noted that at baseline she has debilitating rheumatoid arthritis for many decades. At least for the last 7 years she's had significant deformity in her hands. Her daughter believes this was because of poor specialty care in [place]. Therefore for the last year or so she has been wheelchair-bound. She requires help from family members decided to transfer from the chair to the bed. She is able to lie flat except that her knees have to be flexed. She is able to eat by herself although with difficulty because of a deformed fingers. She is in significant pain and also has itching because of her rheumatoid arthritis. The itching though might not be related to rheumatoid arthritis and she has an allergy evaluation pending. Subsequently after immigrating in July 2017 to [place], [place] she establish with rheumatologist Dr. Tommie Raymond was started on methotrexate 10 mg once a week. She says that shortly after starting methotrexate daughter started noticing dyspnea particularly during transfers to the family members are helping her out. There is no associated wheezing or cough. She then saw Dr Estanislado Pandy 10/31/2016 at which time the methotrexate was stopped. She  started noticing improvement in dyspnea  according to her daughter Whitney Mayer. Since then dyspnea is resolved. She did have follow-up CT chest high resolution 11/10/2016 and this shows some increase peribronchial nodularity consistent with Mycobacterium avium complex that is increased since July 2017. However the basal atelectasis is resolved.  Patient and her daughter Whitney Mayer report significant pain from her rheumatoid arthritis and a very desperate for pain relief with advanced disease modifying agents of rheumatoid arthritis so that she can improve her quality of life.   IMPRESSION: 1. Pulmonary parenchymal pattern of peribronchial thickening, mild bronchiectasis and peribronchovascular nodularity is likely mildly progressive from 05/31/2016 and indicative of mycobacterium avium complex. 2. No evidence of fibrotic interstitial lung disease. 3. 4.3 cm ascending aortic aneurysm. Recommend annual imaging followup by CTA or MRA. This recommendation follows 2010 ACCF/AHA/AATS/ACR/ASA/SCA/SCAI/SIR/STS/SVM Guidelines for the Diagnosis and Management of Patients with Thoracic Aortic Disease. Circulation. 2010; 121: W102-V253. 4. Aortic atherosclerosis (ICD10-170.0). Coronary artery calcification.   Electronically Signed   By: Lorin Picket M.D.   On: 11/10/2016 11:23   has a past medical history of Hypertension; Hypothyroidism; Pneumonia (~ 1943); Rheumatoid arthritis ([place]); and Thyroid disease.   reports that she has never smoked. She has never used smokeless tobacco.  Past Surgical History:  Procedure Laterality Date  . CARDIAC CATHETERIZATION N/A 06/02/2016   Procedure: Left Heart Cath and Coronary Angiography;  Surgeon: Belva Crome, MD;  Location: [facility];  Service: Cardiovascular;  Laterality: N/A;  . CARDIOVERSION N/A 06/02/2016   Procedure: CARDIOVERSION;  Surgeon: Lorretta Harp, MD;  Location: [facility];  Service: Cardiovascular;  Laterality: N/A;  . DILATION AND  CURETTAGE OF UTERUS  1980s  . TUBAL LIGATION  1960s    No Known Allergies  Immunization History  Administered Date(s) Administered  . Influenza-Unspecified 08/29/2016  . Pneumococcal Conjugate-13 08/29/2016    Family History  Problem Relation Age of Onset  . Stroke Mother   . Kidney disease Brother      Current Outpatient Prescriptions:  .  alendronate (FOSAMAX) 70 MG tablet, Take 1 tablet (70 mg total) by mouth once a week. Take with a full glass of water on an empty stomach., Disp: 52 tablet, Rfl: 0 .  calcium carbonate (OS-CAL - DOSED IN MG OF ELEMENTAL CALCIUM) 1250 (500 Ca) MG tablet, Take 1 tablet (500 mg of elemental calcium total) by mouth daily with breakfast., Disp: 90 tablet, Rfl: 3 .  Cholecalciferol 1000 units tablet, Take 1 tablet (1,000 Units total) by mouth daily., Disp: 90 tablet, Rfl: 3 .  diltiazem (CARTIA XT) 120 MG 24 hr capsule, Take 1 capsule (120 mg total) by mouth daily., Disp: 90 capsule, Rfl: 3 .  feeding supplement, ENSURE ENLIVE, (ENSURE ENLIVE) LIQD, Take 237 mLs by mouth 2 (two) times daily between meals., Disp: 237 mL, Rfl: 12 .  folic acid (FOLVITE) 1 MG tablet, Take 1 mg by mouth daily., Disp: , Rfl:  .  levothyroxine (SYNTHROID, LEVOTHROID) 100 MCG tablet, Take 0.5 tablets (50 mcg total) by mouth daily before breakfast., Disp: 90 tablet, Rfl: 3 .  losartan (COZAAR) 100 MG tablet, Take 0.5 tablets (50 mg total) by mouth daily., Disp: 90 tablet, Rfl: 3 .  methotrexate 2.5 MG tablet, Take 10 mg by mouth once a week., Disp: , Rfl:    Review of Systems  Constitutional: Negative for fever and unexpected weight change.  HENT: Positive for postnasal drip. Negative for congestion, dental problem, ear pain, nosebleeds, rhinorrhea, sinus pressure, sneezing, sore throat and trouble swallowing.   Eyes: Negative for redness and itching.  Respiratory: Positive for cough and shortness of breath. Negative for chest tightness and wheezing.   Cardiovascular:  Negative for palpitations and leg swelling.  Gastrointestinal: Negative for nausea and vomiting.  Genitourinary: Negative for dysuria.  Musculoskeletal: Negative for joint swelling.  Skin: Negative for rash.  Neurological: Negative for headaches.  Hematological: Does not bruise/bleed easily.  Psychiatric/Behavioral: Negative for dysphoric mood. The patient is not nervous/anxious.        Objective:   Physical Exam  Constitutional: She is oriented to person, place, and time. No distress.  Frail female sitting in the wheelchair  HENT:  Head: Normocephalic and atraumatic.  Right Ear: External ear normal.  Left Ear: External ear normal.  Mouth/Throat: Oropharynx is clear and moist. No oropharyngeal exudate.  No neck nodes  Eyes: Conjunctivae and EOM are normal. Pupils are equal, round, and reactive to light. Right eye exhibits no discharge. Left eye exhibits no discharge. No scleral icterus.  Neck: Normal range of motion. Neck supple. No JVD present. No tracheal deviation present. No thyromegaly present.  Cardiovascular: Normal rate, regular rhythm, normal heart sounds and intact distal pulses.  Exam reveals no gallop and no friction rub.   No murmur heard. Pulmonary/Chest: Effort normal and breath sounds normal. No respiratory distress. She has no wheezes. She has no rales. She exhibits no tenderness.  Significantly kyphotic  Abdominal: Soft. Bowel sounds are normal. She exhibits no distension and no mass. There is no tenderness. There is no rebound and no guarding.  Soft  Musculoskeletal: She  exhibits tenderness and deformity. She exhibits no edema.  Significant disabling rheumatoid arthritis present  Lymphadenopathy:    She has no cervical adenopathy.  Neurological: She is alert and oriented to person, place, and time. She has normal reflexes. No cranial nerve deficit. She exhibits normal muscle tone. Coordination normal.  Skin: Skin is warm and dry. No rash noted. She is not  diaphoretic. No erythema. No pallor.  Psychiatric: She has a normal mood and affect. Her behavior is normal. Judgment and thought content normal.  Vitals reviewed.   Vitals:   11/15/16 0956  BP: 126/76  Pulse: 93  SpO2: 95%    Estimated body mass index is 20.12 kg/m as calculated from the following:   Height as of 07/24/16: 5' 2"  (1.575 m).   Weight as of 07/24/16: 110 lb (49.9 kg).       Assessment & Plan:     ICD-9-CM ICD-10-CM   1. Pulmonary infiltrates 793.19 R91.8   2. Dyspnea and respiratory abnormality 786.09 R06.00     R06.89   3. Abnormal screening computed tomography (CT) of chest 793.2 R93.8   4. Rheumatoid arthritis involving multiple sites, unspecified rheumatoid factor presence (HCC) 714.0 M06.9     I agree with the radiologist's finding that these infiltrates are suggestive of mycobacterium avium complex. Normally this is asymptomatic. I wonder if the presence of methotrexate activated this making her a little bit more symptomatic. At this point in time her dyspnea is better after she came off methotrexate. The problem is that she needs significant pain relief and the only way possible with advanced rheumatoid arthritis is with agents that can suppress the immune system. I do not think what she had was methotrexate related lung toxicity which can take years to develop and at higher doses.  Because of the need to immunosuppressed and treat her pain and the need to understand what these pulmonary infiltrates which seem to worsen in the last 6 months a bronchoscopy is indicated. She would need bronchoscopy with lavage at the minimum. Any procedure higher than that such as biopsy would be risky. We discussed about her ability to lie flat and she believes she can. Infection underwent cardiac catheterization without any problems. We discussed that we would need to rule out opportunistic infections and if this is okay she can continue with immunosuppressive therapy   Risks of  pneumothorax, hemothorax, sedation/anesthesia complications such as cardiac or respiratory arrest or hypotension, stroke and bleeding all explained. Benefits of diagnosis but limitations of non-diagnosis also explained. Patient verbalized understanding and wished to proceed.    Future Appointments Date Time Provider [place]  11/30/2016 2:30 PM Bo Merino, MD [facility] None   Bronchoscopy scheduled for 12/07/2016 at [facility] at 8 AM - will only do lavage.    Dr. Brand Males, M.D., [facility].C.P Pulmonary and Critical Care Medicine Staff Physician [place] [facility] Pager: [phone], If no answer or between  15:00h - 7:00h: call [phone]  11/15/2016 10:35 AM

## 2016-12-07 NOTE — Interval H&P Note (Signed)
12/07/2016 8:30 AM prepbronch evaluation  S: denies complaint.s Interim hx reviewed. Detailed H&P < 16 days old. No new issues. Confirmed NPO  O Vitals:   12/07/16 0810 12/07/16 0815 12/07/16 0820 12/07/16 0825  BP: (!) 158/73 (!) 159/71 (!) 153/79 (!) 164/80  Pulse: (!) 58 63 (!) 56 (!) 59  Resp: 12 19 (!) 21 18  Temp:      TempSrc:      SpO2: 99% 99% 100% 100%    Exam - RA + but otherwise non focal  A pulm inifilrrates - mid zone and lower zone   P BAL - either RML or RLLL   Risks of pneumothorax, hemothorax, sedation/anesthesia complications such as cardiac or respiratory arrest or hypotension, stroke and bleeding all explained. Benefits of diagnosis but limitations of non-diagnosis also explained. Patient verbalized understanding and wished to proceed.     Dr. Brand Males, M.D., [facility].C.P Pulmonary and Critical Care Medicine Staff Physician [place] [facility] Pager: [phone], If no answer or between  15:00h - 7:00h: call [phone]  12/07/2016 8:31 AM

## 2016-12-08 LAB — PNEUMOCYSTIS JIROVECI SMEAR BY DFA: Pneumocystis jiroveci Ag: NEGATIVE

## 2016-12-08 LAB — ACID FAST SMEAR (AFB)

## 2016-12-08 LAB — ACID FAST SMEAR (AFB, MYCOBACTERIA): Acid Fast Smear: NEGATIVE

## 2016-12-09 LAB — MTB NAA WITHOUT AFB CULTURE: SOURCE: NEGATIVE

## 2016-12-11 DIAGNOSIS — M6281 Muscle weakness (generalized): Secondary | ICD-10-CM | POA: Diagnosis not present

## 2016-12-11 DIAGNOSIS — M069 Rheumatoid arthritis, unspecified: Secondary | ICD-10-CM | POA: Diagnosis not present

## 2016-12-11 LAB — CULTURE, BAL-QUANTITATIVE

## 2016-12-11 LAB — CULTURE, BAL-QUANTITATIVE W GRAM STAIN: Culture: 40000 — AB

## 2016-12-15 ENCOUNTER — Other Ambulatory Visit: Payer: Self-pay | Admitting: *Deleted

## 2016-12-15 ENCOUNTER — Telehealth: Payer: Self-pay | Admitting: Internal Medicine

## 2016-12-15 MED ORDER — CIPROFLOXACIN HCL 500 MG PO TABS: 750.0000 mg | ORAL_TABLET | Freq: Two times a day (BID) | ORAL | 0 refills | Status: DC

## 2016-12-15 NOTE — Telephone Encounter (Signed)
Spoke with pt's daughter, [name]. She is aware of MR's message. Rx has been sent in. I have confirmed pt's OV with [name] as well. Nothing further was needed.

## 2016-12-15 NOTE — Telephone Encounter (Signed)
Let Silvestre Gunner or her daughte rknow that bronch culture is growing pseduomonas  Therefore  Ciprofloxacin 750mg  bid x  7 days (noted: QTc on recent ekg ok) Still keep opd fu with TP on 12/18/16   Dr. 12/20/16, M.D., F.C.C.P Pulmonary and Critical Care Medicine Staff Physician [facility] Pager: [phone], If no answer or between  15:00h - 7:00h: call [phone]  12/15/2016 9:27 AM

## 2016-12-18 ENCOUNTER — Ambulatory Visit (INDEPENDENT_AMBULATORY_CARE_PROVIDER_SITE_OTHER): Payer: Commercial Managed Care - HMO | Admitting: Adult Health

## 2016-12-18 ENCOUNTER — Encounter: Payer: Self-pay | Admitting: Adult Health

## 2016-12-18 DIAGNOSIS — M069 Rheumatoid arthritis, unspecified: Secondary | ICD-10-CM | POA: Diagnosis not present

## 2016-12-18 DIAGNOSIS — J471 Bronchiectasis with (acute) exacerbation: Secondary | ICD-10-CM | POA: Diagnosis not present

## 2016-12-18 NOTE — Assessment & Plan Note (Signed)
Bronchiectatic changes with ?MAI changes on CT (after being on Methotrexate for few months) Clinically she is better after stopping MTX in DEC. Bronchoscopy w/ neg malignant cells on bronchial washings.  Culture did return positive for Psuedomonas , (pansensitive) , she is currently tolerating Cipro . She is to finish full course Follow final cx for AFB/fungal cx   Plan  Patient Instructions  Finish Cipro as directed Eat yogurt daily .  Follow up with Rheumatology to discuss treatment plan  Follow up Dr. Marchelle Gearing in 6 weeks and .As needed   Please contact office for sooner follow up if symptoms do not improve or worsen or seek emergency care

## 2016-12-18 NOTE — Patient Instructions (Addendum)
Finish Cipro as directed Eat yogurt daily .  Follow up with Rheumatology to discuss treatment plan  Follow up Dr. Marchelle Gearing in 6 weeks and .As needed   Please contact office for sooner follow up if symptoms do not improve or worsen or seek emergency care

## 2016-12-18 NOTE — Assessment & Plan Note (Signed)
Follow up with Rheumatology , will need to discuss treatment plans now that she is off Methotrexate .

## 2016-12-18 NOTE — Progress Notes (Signed)
_0  ID: Whitney Mayer, female    DOB: July 31, 1932, 83 y.o.   MRN: [id number]  Chief Complaint  Patient presents with  . Follow-up    f/u FOB     Referring provider: Ledell Noss, MD  HPI: 83 year old female with known rheumatoid arthritis on methotrexate seen for pulmonary consult 11/15/2016 for abnormal CT chest and Dyspnea .   TEST  Pulmonary parenchymal pattern of peribronchial thickening, mild bronchiectasis and peribronchovascular nodularity is likely mildly progressive from 05/31/2016 and indicative of mycobacterium avium complex. 2. No evidence of fibrotic interstitial lung disease. 3. 4.3 cm ascending aortic aneurysm. Recommend annual imaging followup by CTA  12/18/2016 Follow up : Abnormal CT chest , Dyspnea  Patient returns for a one-month follow-up. Patient was seen for pulmonary consult. Last visit for abnormal CT chest that showed mild bronchiectasis am. Bronchovascular nodularity suspicious for MAI. Patient had also has some dyspnea but this resolved in December after she was taken off of methotrexate. Patient is followed by rheumatology, Dr. Estil Daft . She has underlying debilitating rheumatoid arthritis and had been started on methotrexate last fall. Patient denies any dyspnea, cough or congestion. Patient underwent a bronchoscopy on 12/07/2016. Bronchial washing Cytology was negative for malignant cells. BAL returned positive for Pseudomonas (40K ) pansensitive . She was started on Cipro x 7 days on 1/19 . She is tolerating well. AFB/Fungal cx are neg to date. She denies cough , congestion or fever.     No Known Allergies  Immunization History  Administered Date(s) Administered  . Influenza-Unspecified 08/29/2016  . Pneumococcal Conjugate-13 08/29/2016    Past Medical History:  Diagnosis Date  . Hypertension   . Hypothyroidism   . Pneumonia ~ 1943  . Rheumatoid arthritis ([place])    "all over; hands, feet, knees, joints" (05/31/2016)  . Thyroid disease      Tobacco History: History  Smoking Status  . Never Smoker  Smokeless Tobacco  . Never Used   Counseling given: Not Answered   Outpatient Encounter Prescriptions as of 12/18/2016  Medication Sig  . alendronate (FOSAMAX) 70 MG tablet Take 1 tablet (70 mg total) by mouth once a week. Take with a full glass of water on an empty stomach.  . calcium carbonate (OS-CAL - DOSED IN MG OF ELEMENTAL CALCIUM) 1250 (500 Ca) MG tablet Take 1 tablet (500 mg of elemental calcium total) by mouth daily with breakfast.  . Cholecalciferol 1000 units tablet Take 1 tablet (1,000 Units total) by mouth daily.  . ciprofloxacin (CIPRO) 500 MG tablet Take 1.5 tablets (750 mg total) by mouth 2 (two) times daily.  Marland Kitchen diltiazem (CARTIA XT) 120 MG 24 hr capsule Take 1 capsule (120 mg total) by mouth daily.  . feeding supplement, ENSURE ENLIVE, (ENSURE ENLIVE) LIQD Take 237 mLs by mouth 2 (two) times daily between meals.  . folic acid (FOLVITE) 1 MG tablet TAKE ONE TABLET BY MOUTH ONCE DAILY  . levocetirizine (XYZAL) 5 MG tablet   . levothyroxine (SYNTHROID, LEVOTHROID) 100 MCG tablet Take 0.5 tablets (50 mcg total) by mouth daily before breakfast.  . losartan (COZAAR) 100 MG tablet Take 0.5 tablets (50 mg total) by mouth daily.  . famotidine (PEPCID) 20 MG tablet   . [DISCONTINUED] methotrexate 2.5 MG tablet Take 10 mg by mouth once a week.   No facility-administered encounter medications on file as of 12/18/2016.      Review of Systems  Constitutional:   No  weight loss, night sweats,  Fevers, chills, fatigue, or  lassitude.  HEENT:   No headaches,  Difficulty swallowing,  Tooth/dental problems, or  Sore throat,                No sneezing, itching, ear ache, nasal congestion, post nasal drip,   CV:  No chest pain,  Orthopnea, PND, swelling in lower extremities, anasarca, dizziness, palpitations, syncope.   GI  No heartburn, indigestion, abdominal pain, nausea, vomiting, diarrhea, change in bowel habits,  loss of appetite, bloody stools.   Resp: No shortness of breath with exertion or at rest.  No excess mucus, no productive cough,  No non-productive cough,  No coughing up of blood.  No change in color of mucus.  No wheezing.  No chest wall deformity  Skin: no rash or lesions.  GU: no dysuria, change in color of urine, no urgency or frequency.  No flank pain, no hematuria   MS: +RA w/ hand deformities    Physical Exam  BP 132/68 (BP Location: Right Arm, Cuff Size: Normal)   Pulse 83   Ht _0  (1.575 m)   Wt 115 lb (52.2 kg)   LMP  (LMP Unknown)   SpO2 95%   BMI 21.03 kg/m   GEN: A/Ox3; pleasant , NAD, elderly in wc    HEENT:  [place]/AT,  EACs-clear, TMs-wnl, NOSE-clear, THROAT-clear, no lesions, no postnasal drip or exudate noted.   NECK:  Supple w/ fair ROM; no JVD; normal carotid impulses w/o bruits; no thyromegaly or nodules palpated; no lymphadenopathy.    RESP  Clear  P & A; w/o, wheezes/ rales/ or rhonchi. no accessory muscle use, no dullness to percussion  CARD:  RRR, no m/r/g, no peripheral edema, pulses intact, no cyanosis or clubbing.  GI:   Soft & nt; nml bowel sounds; no organomegaly or masses detected.   Musco: Warm bil, severe hand contractures/RA deformities bilaterally    Neuro: alert, no focal deficits noted.    Skin: Warm, no lesions or rashes  Psych:  No change in mood or affect. No depression or anxiety.  No memory loss.  Lab Results:  CBC    Component Value Date/Time   WBC 10.2 10/31/2016 0926   RBC 4.38 10/31/2016 0926   HGB 11.5 (L) 10/31/2016 0926   HCT 36.5 10/31/2016 0926   HCT 40.3 07/04/2016 1552   PLT 303 10/31/2016 0926   PLT 379 07/04/2016 1552   MCV 83.3 10/31/2016 0926   MCV 82 07/04/2016 1552   MCH 26.3 (L) 10/31/2016 0926   MCHC 31.5 (L) 10/31/2016 0926   RDW 17.2 (H) 10/31/2016 0926   RDW 16.0 (H) 07/04/2016 1552   LYMPHSABS 1,734 10/31/2016 0926   MONOABS 918 10/31/2016 0926   EOSABS 408 10/31/2016 0926   BASOSABS 0  10/31/2016 0926    BMET    Component Value Date/Time   NA 137 10/31/2016 0926   NA 134 07/04/2016 1552   K 4.1 10/31/2016 0926   CL 103 10/31/2016 0926   CO2 27 10/31/2016 0926   GLUCOSE 84 10/31/2016 0926   BUN 18 10/31/2016 0926   BUN 15 07/04/2016 1552   CREATININE 0.67 10/31/2016 0926   CALCIUM 9.0 10/31/2016 0926   GFRNONAA 82 10/31/2016 0926   GFRAA >89 10/31/2016 0926    BNP No results found for: BNP  ProBNP No results found for: PROBNP  Imaging: No results found.   Assessment & Plan:   Bronchiectasis with acute exacerbation ([place]) Bronchiectatic changes with ?MAI changes on CT (after being on Methotrexate for few  months) Clinically she is better after stopping MTX in DEC. Bronchoscopy w/ neg malignant cells on bronchial washings.  Culture did return positive for Psuedomonas , (pansensitive) , she is currently tolerating Cipro . She is to finish full course Follow final cx for AFB/fungal cx   Plan  Patient Instructions  Finish Cipro as directed Eat yogurt daily .  Follow up with Rheumatology to discuss treatment plan  Follow up Dr. Chase Caller in 6 weeks and .As needed   Please contact office for sooner follow up if symptoms do not improve or worsen or seek emergency care      Rheumatoid arthritis involving multiple sites [facility]) Follow up with Rheumatology , will need to discuss treatment plans now that she is off Methotrexate .       Rexene Edison, NP 12/18/2016

## 2016-12-21 ENCOUNTER — Ambulatory Visit: Payer: Commercial Managed Care - HMO | Admitting: Adult Health

## 2016-12-26 NOTE — Addendum Note (Signed)
Addended by: Bufford Spikes on: 12/26/2016 12:04 PM   Modules accepted: Orders

## 2016-12-28 LAB — CULTURE, FUNGUS WITHOUT SMEAR

## 2016-12-30 DIAGNOSIS — M6281 Muscle weakness (generalized): Secondary | ICD-10-CM | POA: Diagnosis not present

## 2016-12-30 DIAGNOSIS — M069 Rheumatoid arthritis, unspecified: Secondary | ICD-10-CM | POA: Diagnosis not present

## 2017-01-11 DIAGNOSIS — M6281 Muscle weakness (generalized): Secondary | ICD-10-CM | POA: Diagnosis not present

## 2017-01-11 DIAGNOSIS — M069 Rheumatoid arthritis, unspecified: Secondary | ICD-10-CM | POA: Diagnosis not present

## 2017-01-27 DIAGNOSIS — M6281 Muscle weakness (generalized): Secondary | ICD-10-CM | POA: Diagnosis not present

## 2017-01-27 DIAGNOSIS — M069 Rheumatoid arthritis, unspecified: Secondary | ICD-10-CM | POA: Diagnosis not present

## 2017-01-29 ENCOUNTER — Ambulatory Visit: Payer: Commercial Managed Care - HMO | Admitting: Internal Medicine

## 2017-01-30 DIAGNOSIS — L509 Urticaria, unspecified: Secondary | ICD-10-CM | POA: Diagnosis not present

## 2017-02-08 DIAGNOSIS — M6281 Muscle weakness (generalized): Secondary | ICD-10-CM | POA: Diagnosis not present

## 2017-02-08 DIAGNOSIS — M069 Rheumatoid arthritis, unspecified: Secondary | ICD-10-CM | POA: Diagnosis not present

## 2017-02-09 LAB — ACID FAST CULTURE WITH REFLEXED SENSITIVITIES (MYCOBACTERIA): Acid Fast Culture: NEGATIVE

## 2017-02-12 ENCOUNTER — Telehealth: Payer: Self-pay | Admitting: Internal Medicine

## 2017-02-12 ENCOUNTER — Ambulatory Visit (INDEPENDENT_AMBULATORY_CARE_PROVIDER_SITE_OTHER): Payer: Medicare HMO | Admitting: Internal Medicine

## 2017-02-12 ENCOUNTER — Encounter: Payer: Self-pay | Admitting: Internal Medicine

## 2017-02-12 DIAGNOSIS — J479 Bronchiectasis, uncomplicated: Secondary | ICD-10-CM

## 2017-02-12 NOTE — Assessment & Plan Note (Addendum)
You grew pseudomonas from bronch 1/11/8 in the area of lung that has bronchiectasis This could have been a flare up one time thing or a colonizer - which means lives there low grade leve. Glad you are feeling better after cipro course  Plan -  do HRCT chest to see followup since dec 2017 - do this before end of this month so Dr Corliss Skains has results - depending on CT results and if you are feeling well, I do not see why we should not try immune modulator again esp becuaes your RA pain is very disabling. I would not recommend TNF-alpha blocker class right now due to highest infection risk but perhaps they could start with another class immune modulator and assess  Followup 3 months or sooner if needed

## 2017-02-12 NOTE — Progress Notes (Signed)
Subjective:     Patient ID: Whitney Mayer, female   DOB: Apr 22, 1932, 83 y.o.   MRN: [id number]  HPI    OV 11/15/2016  Chief Complaint  Patient presents with  . Advice Only    referred by Dr. Garen Grams after ct chest on 12/15    83 year old female accompanied by her daughter Whitney Mayer. They're originally from [place]. In July 2017 patient was visiting her granddaughter's graduation and she got hospitalized for atypical chest pain and underwent cardiac catheterization. She also had brief atrial fibrillation for which she was on anticoagulation for a few months. CT chest shows some atelectasis or pulmonary embolism was ruled out. I personally visualized the CT chest. Subsequently she does not have any shortness of breath. It is to be noted that at baseline she has debilitating rheumatoid arthritis for many decades. At least for the last 7 years she's had significant deformity in her hands. Her daughter believes this was because of poor specialty care in [place]. Therefore for the last year or so she has been wheelchair-bound. She requires help from family members decided to transfer from the chair to the bed. She is able to lie flat except that her knees have to be flexed. She is able to eat by herself although with difficulty because of a deformed fingers. She is in significant pain and also has itching because of her rheumatoid arthritis. The itching though might not be related to rheumatoid arthritis and she has an allergy evaluation pending. Subsequently after immigrating in July 2017 to [place], [place] she establish with rheumatologist Dr. Tommie Raymond was started on methotrexate 10 mg once a week. She says that shortly after starting methotrexate daughter started noticing dyspnea particularly during transfers to the family members are helping her out. There is no associated wheezing or cough. She then saw Dr Estanislado Pandy 10/31/2016 at which time the methotrexate was stopped. She  started noticing improvement in dyspnea  according to her daughter Whitney Mayer. Since then dyspnea is resolved. She did have follow-up CT chest high resolution 11/10/2016 and this shows some increase peribronchial nodularity consistent with Mycobacterium avium complex that is increased since July 2017. However the basal atelectasis is resolved.  Patient and her daughter Whitney Mayer report significant pain from her rheumatoid arthritis and a very desperate for pain relief with advanced disease modifying agents of rheumatoi  d arthritis so that she can improve her quality of life.   IMPRESSION: 1. Pulmonary parenchymal pattern of peribronchial thickening, mild bronchiectasis and peribronchovascular nodularity is likely mildly progressive from 05/31/2016 and indicative of mycobacterium avium complex. 2. No evidence of fibrotic interstitial lung disease. 3. 4.3 cm ascending aortic aneurysm. Recommend annual imaging followup by CTA or MRA. This recommendation follows 2010 ACCF/AHA/AATS/ACR/ASA/SCA/SCAI/SIR/STS/SVM Guidelines for the Diagnosis and Management of Patients with Thoracic Aortic Disease. Circulation. 2010; 121: V956-L875. 4. Aortic atherosclerosis (ICD10-170.0). Coronary artery calcification.   Electronically Signed   By: Lorin Picket M.D.   On: 11/10/2016 11:23   has a past medical history of Hypertension; Hypothyroidism; Pneumonia (~ 1943); Rheumatoid arthritis ([place]); and Thyroid disease.   reports that she has never smoked. She has never used smokeless tobacco.   OV 02/12/2017  Chief Complaint  Patient presents with  . Follow-up    Pt denies SOB, CP/tightness and chest congestion. Pt states she has an infrequent dry cough.    Follow-up mild mid zone and lower zone bronchiectasis in the setting of rheumatoid arthritis  She is here with her daughter Whitney Mayer. She again tells  me that after taking one month of methotrexate she done worsening shortness of breath and had  to stop it. When she stopped methotrexate dyspnea did get better. Her and 2017 CT chest showed mid and lower zone bronchiectasis. Therefore early January 2018 we did bronchoscopy with lavage. The initial suspicion was  mycobacterium avium complex. However bronchoscopy results showed Pseudomonas. We treated her with ciprofloxacin. She and her daughter tell me that the ciprofloxacin didn't help and she feels better overall although they're not able to specify how exactly. I suspect that her dyspnea is better. Her main issues that she has disabling pain from her rheumatoid arthritis and the disability associated with it. She really wants a medication for this but is worried about infection risk. She is due to see rheumatology on 02/28/2017    has a past medical history of Hypertension; Hypothyroidism; Pneumonia (~ 1943); Rheumatoid arthritis ([place]); and Thyroid disease.   reports that she has never smoked. She has never used smokeless tobacco.  Past Surgical History:  Procedure Laterality Date  . CARDIAC CATHETERIZATION N/A 06/02/2016   Procedure: Left Heart Cath and Coronary Angiography;  Surgeon: Belva Crome, MD;  Location: [facility];  Service: Cardiovascular;  Laterality: N/A;  . CARDIOVERSION N/A 06/02/2016   Procedure: CARDIOVERSION;  Surgeon: Lorretta Harp, MD;  Location: [facility];  Service: Cardiovascular;  Laterality: N/A;  . DILATION AND CURETTAGE OF UTERUS  1980s  . TUBAL LIGATION  1960s  . VIDEO BRONCHOSCOPY Bilateral 12/07/2016   Procedure: VIDEO BRONCHOSCOPY WITHOUT FLUORO;  Surgeon: Brand Males, MD;  Location: [facility];  Service: Cardiopulmonary;  Laterality: Bilateral;    No Known Allergies  Immunization History  Administered Date(s) Administered  . Influenza-Unspecified 08/29/2016  . Pneumococcal Conjugate-13 08/29/2016    Family History  Problem Relation Age of Onset  . Stroke Mother   . Kidney disease Brother      Current Outpatient Prescriptions:  .   alendronate (FOSAMAX) 70 MG tablet, Take 1 tablet (70 mg total) by mouth once a week. Take with a full glass of water on an empty stomach., Disp: 52 tablet, Rfl: 0 .  calcium carbonate (OS-CAL - DOSED IN MG OF ELEMENTAL CALCIUM) 1250 (500 Ca) MG tablet, Take 1 tablet (500 mg of elemental calcium total) by mouth daily with breakfast., Disp: 90 tablet, Rfl: 3 .  Cholecalciferol 1000 units tablet, Take 1 tablet (1,000 Units total) by mouth daily., Disp: 90 tablet, Rfl: 3 .  diltiazem (CARTIA XT) 120 MG 24 hr capsule, Take 1 capsule (120 mg total) by mouth daily., Disp: 90 capsule, Rfl: 3 .  feeding supplement, ENSURE ENLIVE, (ENSURE ENLIVE) LIQD, Take 237 mLs by mouth 2 (two) times daily between meals., Disp: 237 mL, Rfl: 12 .  folic acid (FOLVITE) 1 MG tablet, TAKE ONE TABLET BY MOUTH ONCE DAILY, Disp: 90 tablet, Rfl: 1 .  levocetirizine (XYZAL) 5 MG tablet, , Disp: , Rfl:  .  levothyroxine (SYNTHROID, LEVOTHROID) 100 MCG tablet, Take 0.5 tablets (50 mcg total) by mouth daily before breakfast., Disp: 90 tablet, Rfl: 3 .  losartan (COZAAR) 100 MG tablet, Take 0.5 tablets (50 mg total) by mouth daily., Disp: 90 tablet, Rfl: 3   Review of Systems     Objective:   Physical Exam Vitals:   02/12/17 1041  BP: (!) 144/82  Pulse: 82  SpO2: 95%  Height: _0  (1.575 m)    Estimated body mass index is 21.03 kg/m as calculated from the following:  Height as of 12/18/16: _0  (1.575 m).   Weight as of 12/18/16: 115 lb (52.2 kg). Brief exam shows a debilitated frail lady who is very pleasant is obvious from her arthritis deformities. Respiratory exam shows mild crackles on the right base and in the left base but that was faint      Assessment:       ICD-9-CM ICD-10-CM   1. Bronchiectasis without complication (HCC) 466.5 J47.9        Plan:     Bronchiectasis without acute exacerbation ([place]) You grew pseudomonas from bronch 1/11/8 in the area of lung that has bronchiectasis This could have  been a flare up one time thing or a colonizer - which means lives there low grade leve. Glad you are feeling better after cipro course  Plan -  do HRCT chest to see followup since dec 2017 - do this before end of this month so Dr Estanislado Pandy has results - depending on CT results and if you are feeling well, I do not see why we should not try immune modulator again esp becuaes your RA pain is very disabling. I would not recommend TNF-alpha blocker class right now due to highest infection risk but perhaps they could start with another class immune modulator and assess  Followup 3 months or sooner if needed   (> 50% of this 15 min visit spent in face to face counseling or/and coordination of care)  Dr. Brand Males, M.D., [facility].C.P Pulmonary and Critical Care Medicine Staff Physician [facility] Pager: [phone], If no answer or between  15:00h - 7:00h: call [phone]  02/12/2017 11:09 AM

## 2017-02-12 NOTE — Telephone Encounter (Signed)
APT. REMINDER CALL, LMTCB °

## 2017-02-12 NOTE — Patient Instructions (Addendum)
Bronchiectasis without acute exacerbation (HCC) You grew pseudomonas from bronch 1/11/8 in the area of lung that has bronchiectasis This could have been a flare up one time thing or a colonizer - which means lives there low grade leve. Glad you are feeling better after cipro course  Plan -  do HRCT chest to see followup since dec 2017 - do this before end of this month so Dr Corliss Skains has results - depending on CT results and if you are feeling well, I do not see why we should not try immune modulator again esp becuaes your RA pain is very disabling. I would not recommend TNF-alpha blocker class right now due to highest infection risk  Followup 3 months or sooner if needed

## 2017-02-13 ENCOUNTER — Ambulatory Visit (INDEPENDENT_AMBULATORY_CARE_PROVIDER_SITE_OTHER): Payer: Medicare HMO | Admitting: Internal Medicine

## 2017-02-13 ENCOUNTER — Encounter: Payer: Self-pay | Admitting: Internal Medicine

## 2017-02-13 VITALS — BP 139/74 | HR 82 | Temp 98.2°F | Ht 62.0 in

## 2017-02-13 DIAGNOSIS — I714 Abdominal aortic aneurysm, without rupture, unspecified: Secondary | ICD-10-CM

## 2017-02-13 DIAGNOSIS — I10 Essential (primary) hypertension: Secondary | ICD-10-CM | POA: Diagnosis not present

## 2017-02-13 DIAGNOSIS — Z79899 Other long term (current) drug therapy: Secondary | ICD-10-CM | POA: Diagnosis not present

## 2017-02-13 DIAGNOSIS — I7121 Aneurysm of the ascending aorta, without rupture: Secondary | ICD-10-CM

## 2017-02-13 DIAGNOSIS — M069 Rheumatoid arthritis, unspecified: Secondary | ICD-10-CM

## 2017-02-13 DIAGNOSIS — I700 Atherosclerosis of aorta: Secondary | ICD-10-CM | POA: Diagnosis not present

## 2017-02-13 DIAGNOSIS — L299 Pruritus, unspecified: Secondary | ICD-10-CM

## 2017-02-13 DIAGNOSIS — I4891 Unspecified atrial fibrillation: Secondary | ICD-10-CM

## 2017-02-13 DIAGNOSIS — I712 Thoracic aortic aneurysm, without rupture: Secondary | ICD-10-CM | POA: Diagnosis not present

## 2017-02-13 MED ORDER — TRAMADOL HCL 50 MG PO TABS: 50.0000 mg | ORAL_TABLET | Freq: Four times a day (QID) | ORAL | 0 refills | Status: DC | PRN

## 2017-02-13 MED ORDER — DICLOFENAC SODIUM 1 % TD GEL: 2.0000 g | Freq: Four times a day (QID) | TRANSDERMAL | 3 refills | Status: DC

## 2017-02-13 MED ORDER — LEVOCETIRIZINE DIHYDROCHLORIDE 5 MG PO TABS: 5.0000 mg | ORAL_TABLET | Freq: Every evening | ORAL | 3 refills | Status: DC

## 2017-02-13 NOTE — Progress Notes (Signed)
CC: rheumatoid arthritis pain   HPI: [name] is a 83 y.o. with past medical history as outlined below who presents to clinic for follow up of puritis   Please see problem list for status of the pt's chronic medical problems.  Puritis  Was referred for allergy and immunology evaluation at last office visit. They prescribed xyzal which has worked to relieve her itch. [facility] has not had the medication in stock and her itch has returned since she ran out of the medication.   Rheumatoid Arthritis  At last office visit she described new onset shortness of breath. High resolution CT chest revealed bronchiectasis and bronchoscopy results revealed pseudomonas .  She has completed a course of cipro and reports resolution of her dyspnea on exertion. She has follow up scheduled with Dr. Corliss Skains to discuss whether or not she will resume MTX. She has been using tylenol but continues to experience pain.   Past Medical History:  Diagnosis Date  . Hypertension   . Hypothyroidism   . Pneumonia ~ 1943  . Rheumatoid arthritis (HCC)    "all over; hands, feet, knees, joints" (05/31/2016)  . Thyroid disease     Review of Systems:  Please see each problem below for a pertinent review of systems.  Physical Exam:  Vitals:   02/13/17 1350  BP: 139/74  Pulse: 82  Temp: 98.2 F (36.8 C)  TempSrc: Oral  SpO2: 98%  Height: 5\' 2"  (1.575 m)   Physical Exam  Constitutional: She is oriented to person, place, and time. She appears well-developed and well-nourished. No distress.  HENT:  Head: Normocephalic and atraumatic.  Eyes: Conjunctivae are normal. No scleral icterus.  Neck: Normal range of motion.  Cardiovascular: Normal rate and regular rhythm.   No murmur heard. Pulmonary/Chest: Effort normal and breath sounds normal. No respiratory distress.  Abdominal: Soft. She exhibits no distension. There is no tenderness.  Musculoskeletal: She exhibits no tenderness.  Kyphosis, subluxation of  MCPs, ulnar deviation No synovitis in hands   Neurological: She is alert and oriented to person, place, and time.  Skin: Skin is warm and dry. She is not diaphoretic.  Psychiatric: She has a normal mood and affect. Her behavior is normal.     Assessment & Plan:   See Encounters Tab for problem based charting.  Puritis  Following with allergy and immunology. This has responded well to xyzal. Will print an Rx so she can have a pharmacy that has this in stock.  - Printed prescription for xyzal   Rheumatoid arthritis  History of pain which is not well controlled with tylenol. She does not have synovitis on exam. This pain may be related to joint damage more so than inflammation so NSAID may not be affective. Will prescribe a trial of Tramadol and voltaren gel.  I explained to her that this is a one time prescription for tramadol and will not be refilled.  -continue conservative management  - prescribed tramadol  - prescribed voltaren gel  - follow up with rheumatology  Aortic atherosclerosis  Found incidentally on high resolution CT chest 11/10/2016. She does not have chest pain or symptoms of coronary artery disease. Discussed the option of beginning statin aspirin therapy. She has had LDL 76 in the past and I do not know how much she would benefit from this therapy given her age. Had a discussion with the pt and her daughter about the risk and benefits of beginning this therapy. They have opted to wait on beginning  this prophylaxis at this time and I agree. Will continue to monitor.   Ascending aortic aneurysm  4.6 cm ascending aortic aneurysm found incidentally on high resolution CT angio chest 05/2016. On high resolution CT chest 11/10/2016 aneurysm was measured at 4.3 cm. Does not have symptoms of dissection at this time.  - Will need repeat imaging 10/2017 with CTA or MRA. If the aneurysm has enlarged by >10 mm in one year or reaches >5.5 cm will need to discuss elective surgery,  otherwise I will repeat yearly monitoring.  -continue blood pressure control   Hypertension BP Readings from Last 3 Encounters:  02/13/17 139/74  02/12/17 (!) 144/82  12/18/16 132/68  BP well controlled today, improved from prior visit.   - Refilled Losartan and Diltiazem  Patient discussed with Dr. Cleda Daub

## 2017-02-13 NOTE — Patient Instructions (Signed)
Whitney Mayer,  It was a pleasure to see you today,   For your pain,  Try taking tramadol only as needed be careful with moving around on your own after you have taken this medication.  Try using voltaren gel  Please schedule a follow up appointment to see me in 6 months

## 2017-02-15 DIAGNOSIS — I700 Atherosclerosis of aorta: Secondary | ICD-10-CM | POA: Insufficient documentation

## 2017-02-15 MED ORDER — LOSARTAN POTASSIUM 100 MG PO TABS: 50.0000 mg | ORAL_TABLET | Freq: Every day | ORAL | 11 refills | Status: DC

## 2017-02-15 MED ORDER — CHOLECALCIFEROL 25 MCG (1000 UT) PO TABS: 1000.0000 [IU] | ORAL_TABLET | Freq: Every day | ORAL | 11 refills | Status: DC

## 2017-02-15 MED ORDER — CALCIUM CARBONATE 1250 (500 CA) MG PO TABS: 1.0000 | ORAL_TABLET | Freq: Every day | ORAL | 11 refills | Status: DC

## 2017-02-15 MED ORDER — DILTIAZEM HCL ER COATED BEADS 120 MG PO CP24: 120.0000 mg | ORAL_CAPSULE | Freq: Every day | ORAL | 11 refills | Status: DC

## 2017-02-15 MED ORDER — LEVOTHYROXINE SODIUM 100 MCG PO TABS: 50.0000 ug | ORAL_TABLET | Freq: Every day | ORAL | 11 refills | Status: DC

## 2017-02-15 MED ORDER — ALENDRONATE SODIUM 70 MG PO TABS: 70.0000 mg | ORAL_TABLET | ORAL | 3 refills | Status: DC

## 2017-02-15 MED ORDER — FOLIC ACID 1 MG PO TABS: 1.0000 mg | ORAL_TABLET | Freq: Every day | ORAL | 11 refills | Status: DC

## 2017-02-15 NOTE — Assessment & Plan Note (Signed)
Following with allergy and immunology. This has responded well to xyzal. Will print an Rx so she can have a pharmacy that has this in stock.  - Printed prescription for xyzal

## 2017-02-15 NOTE — Assessment & Plan Note (Signed)
BP Readings from Last 3 Encounters:  02/13/17 139/74  02/12/17 (!) 144/82  12/18/16 132/68  BP well controlled today, improved from prior visit.   - Refilled Losartan and Diltiazem

## 2017-02-15 NOTE — Assessment & Plan Note (Signed)
4.6 cm ascending aortic aneurysm found incidentally on high resolution CT angio chest 05/2016. On high resolution CT chest 11/10/2016 aneurysm was measured at 4.3 cm. Does not have symptoms of dissection at this time.  - Will need repeat imaging by 10/2017 if the aneurysm has enlarged by >10 mm in one year or reaches >5.5 cm will need to discuss elective surgery, otherwise I will repeat yearly monitoring

## 2017-02-15 NOTE — Assessment & Plan Note (Signed)
Found incidentally on high resolution CT chest 11/10/2016. She does not have chest pain or symptoms of coronary artery disease. Discussed the option of beginning statin aspirin therapy. She has had LDL 76 in the past and I do not know how much she would benefit from this therapy given her age. Had a discussion with the pt and her daughter about the risk and benefits of beginning this therapy. They have opted to wait on beginning this prophylaxis at this time and I agree. Will continue to monitor.

## 2017-02-15 NOTE — Assessment & Plan Note (Signed)
History of pain which is not well controlled with tylenol. She does not have synovitis on exam. This pain may be related to joint damage more so than inflammation so NSAID may not be affective. Will prescribe a trial of Tramadol and voltaren gel.  I explained to her that this is a one time prescription for tramadol and will not be refilled.  -continue conservative management  - prescribed tramadol  - prescribed voltaren gel  - follow up with rheumatology

## 2017-02-19 NOTE — Progress Notes (Signed)
Internal Medicine Clinic Attending  Case discussed with Dr. Blum at the time of the visit.  We reviewed the resident's history and exam and pertinent patient test results.  I agree with the assessment, diagnosis, and plan of care documented in the resident's note. 

## 2017-02-22 NOTE — Progress Notes (Addendum)
Office Visit Note  Patient: Whitney Mayer             Date of Birth: [birth year]           MRN: [id number]             PCP: Ledell Noss, MD Referring: Ledell Noss, MD Visit Date: 02/28/2017 Occupation: '@GUAROCC'$ @    Subjective:  Follow-up   History of Present Illness: Whitney Mayer is a 83 y.o. female   Last seen in our office on 11/30/2016. On that visit it was noted that patient was doing well with the methotrexate except cost her to have bronchiectasis. Please see Epic for full details. She saw Dr. Chase Caller. 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 In addition she developed shortness of breath. Due to these events, she was asked to stop the methotrexate. She continues to be off of the methotrexate at this time. Note: She did well while she was on the methotrexate 4 pills every week. Align the joints felt well and she was having many good days. On occasion she would have a flare with the change of weather but overall had better days. Unfortunately now that she has stopped she's having joint stiffness and joint discomfort. Patient tries to do her best to do some exercises to minimize his stiffness in the pain. However, last night she had a fairly bad night and she was able to get only 4-5 hours asleep due to the ongoing joint discomfort.  Daughter, Whitney Mayer, asks on 08/29/2016 labs that we had ordered that there was a 2+ leukocyte Estrace and when she brought up with her PCP, Dr. Gust Rung, Dr. Gust Rung recommended that she speak with [place]. I advised Whitney Mayer that patient should be treated by the PCP for proper elimination of the leukocyte Estrace if indicated. Note recently she was treated with ciprofloxacin which may have eliminated the leukocyte esterase/urine UTI infection She may need to do a repeat urinalysis to verify if the urine is back to normal or not. If any abnormality remains in the urine, patient would benefit from seeing PCP for further  evaluation and treatment.  Activities of Daily Living:  Patient reports morning stiffness for 60 minutes.   Patient Reports nocturnal pain.  Difficulty dressing/grooming: Reports Difficulty climbing stairs: Reports Difficulty getting out of chair: Reports Difficulty using hands for taps, buttons, cutlery, and/or writing: Reports   Review of Systems  Constitutional: Negative for fatigue.  HENT: Negative for mouth sores and mouth dryness.   Eyes: Negative for dryness.  Respiratory: Negative for shortness of breath.   Gastrointestinal: Negative for constipation and diarrhea.  Musculoskeletal: Positive for arthralgias (pain is usually "5" but can be a "9" 50% of the month), joint pain (pain is usually "5" but can be a "9" 50% of the month) and morning stiffness (about 1 hour each morning). Negative for myalgias and myalgias.  Skin: Negative for sensitivity to sunlight.  Psychiatric/Behavioral: Negative for decreased concentration and sleep disturbance.    PMFS History:  Patient Active Problem List   Diagnosis Date Noted  . Aortic atherosclerosis ([place]) 02/15/2017  . Contractures involving both knees 11/29/2016  . Primary osteoarthritis of both knees 11/29/2016  . Primary osteoarthritis of both hips 11/29/2016  . Bronchiectasis without acute exacerbation ([place]) 11/29/2016  . Pulmonary infiltrates 11/15/2016  . Pruritus 10/29/2016  . High risk medication use 10/12/2016  . No blood products 09/11/2016  . Hypertension 06/08/2016  . Osteoporosis 06/08/2016  . Hashimoto's thyroiditis 06/01/2016  .  Thoracic ascending aortic aneurysm ([place]) 06/01/2016  . Rheumatoid arthritis involving multiple sites ([place]) 06/01/2016  . Coronary artery calcification 06/01/2016    Past Medical History:  Diagnosis Date  . Hypertension   . Hypothyroidism   . Pneumonia ~ 1943  . Rheumatoid arthritis ([place])    "all over; hands, feet, knees, joints" (05/31/2016)  . Thyroid disease     Family History    Problem Relation Age of Onset  . Stroke Mother   . Kidney disease Brother    Past Surgical History:  Procedure Laterality Date  . CARDIAC CATHETERIZATION N/A 06/02/2016   Procedure: Left Heart Cath and Coronary Angiography;  Surgeon: Belva Crome, MD;  Location: [facility];  Service: Cardiovascular;  Laterality: N/A;  . CARDIOVERSION N/A 06/02/2016   Procedure: CARDIOVERSION;  Surgeon: Lorretta Harp, MD;  Location: [facility];  Service: Cardiovascular;  Laterality: N/A;  . DILATION AND CURETTAGE OF UTERUS  1980s  . TUBAL LIGATION  1960s  . VIDEO BRONCHOSCOPY Bilateral 12/07/2016   Procedure: VIDEO BRONCHOSCOPY WITHOUT FLUORO;  Surgeon: Brand Males, MD;  Location: [facility];  Service: Cardiopulmonary;  Laterality: Bilateral;   Social History   Social History Narrative  . No narrative on file     Objective: Vital Signs: BP 138/87   Pulse 72   Resp 13   Ht '5\' 2"'$  (1.575 m)   Wt 115 lb (52.2 kg)   LMP  (LMP Unknown)   BMI 21.03 kg/m    Physical Exam  Constitutional: She is oriented to person, place, and time. She appears well-developed and well-nourished.  HENT:  Head: Normocephalic and atraumatic.  Eyes: EOM are normal. Pupils are equal, round, and reactive to light.  Cardiovascular: Normal rate, regular rhythm and normal heart sounds.  Exam reveals no gallop and no friction rub.   No murmur heard. Pulmonary/Chest: Effort normal and breath sounds normal. She has no wheezes. She has no rales.  Abdominal: Soft. Bowel sounds are normal. She exhibits no distension. There is no tenderness. There is no guarding. No hernia.  Musculoskeletal: Normal range of motion. She exhibits no edema, tenderness or deformity.  Lymphadenopathy:    She has no cervical adenopathy.  Neurological: She is alert and oriented to person, place, and time. Coordination normal.  Skin: Skin is warm and dry. Capillary refill takes less than 2 seconds. No rash noted.  Psychiatric: She has a normal  mood and affect. Her behavior is normal.  Nursing note and vitals reviewed.    Musculoskeletal Exam:  Good range of motion of bilateral shoulders Full range of motion of neck Unable to make a fist formation due to deformities in the PIP DIP and MCP joints bilaterally  CDAI Exam: CDAI Homunculus Exam:   Tenderness:  Left hand: 2nd MCP and 3rd MCP  Swelling:  Left hand: 2nd MCP and 3rd MCP  Joint Counts:  CDAI Tender Joint count: 2 CDAI Swollen Joint count: 2  Global Assessments:  Patient Global Assessment: 9 Provider Global Assessment: 9  CDAI Calculated Score: 22    Investigation: Findings:  10/31/2016 CBC hemoglobin 11.5, CMP normal  Admission on 12/07/2016, Discharged on 12/07/2016  Component Date Value Ref Range Status  . Specimen Description 12/07/2016 BRONCHIAL ALVEOLAR LAVAGE   Final  . Special Requests 12/07/2016 Immunocompromised   Final  . Gram Stain 12/07/2016    Final                   Value:FEW WBC PRESENT, PREDOMINANTLY PMN  RARE GRAM POSITIVE COCCI IN PAIRS   . Culture 12/07/2016 40,000 COLONIES/mL PSEUDOMONAS AERUGINOSA*  Final  . Report Status 12/07/2016 12/11/2016 FINAL   Final  . Organism ID, Bacteria 12/07/2016 PSEUDOMONAS AERUGINOSA*  Final  . Specimen Source-PJSRC 12/07/2016 BRONCHIAL ALVEOLAR LAVAGE   Final  . Pneumocystis jiroveci Ag 12/07/2016 NEGATIVE   Final  . Fluid Type-FCT 12/07/2016 BRONCHIAL ALVEOLAR LAVAGE   Final  . Color, Fluid 12/07/2016 COLORLESS* YELLOW Final  . Appearance, Fluid 12/07/2016 CLOUDY* CLEAR Final  . WBC, Fluid 12/07/2016 235  0 - 1,000 cu mm Final  . Neutrophil Count, Fluid 12/07/2016 97* 0 - 25 % Final  . Lymphs, Fluid 12/07/2016 1  % Final  . Monocyte-Macrophage-Serous Fluid 12/07/2016 1* 50 - 90 % Final  . Eos, Fluid 12/07/2016 1  % Final  . Other Cells, Fluid 12/07/2016 FEW MESOTHELIAL CELLS PRESENT  % Final  . AFB Specimen Processing 12/07/2016 Concentration   Final  . Acid Fast Smear 12/07/2016 Negative    Final   Comment: (NOTE) Performed At: [facility] [place], [place] [id number] Lindon Romp MD [id number]   . Source (AFB) 12/07/2016 BRONCHIAL ALVEOLAR LAVAGE   Final  . Acid Fast Culture 12/07/2016 Negative   Final   Comment: (NOTE) No acid fast bacilli isolated after 6 weeks. Performed At: [facility] [place], [place] [id number] Lindon Romp MD UD:[id number]   . Source of Sample 12/07/2016 BRONCHIAL ALVEOLAR LAVAGE   Final  . Specimen Description 12/07/2016 BRONCHIAL ALVEOLAR LAVAGE   Final  . Special Requests 12/07/2016 Immunocompromised   Final  . Culture 12/07/2016 NO FUNGUS ISOLATED AFTER 21 DAYS   Final  . Report Status 12/07/2016 12/28/2016 FINAL   Final  . M Tuberculosis, Naa 12/07/2016 Negative  Negative Final   Comment: (NOTE) Validation studies at [facility] have demonstrated that PCR testing is highly sensitive for the detection of Mycobacterium tuberculosis complex in smear negative or smear positive specimens. PCR results should be used in conjunction with laboratory test results and the patient's clinical profile. Per the College of American Pathologists (CAP) guidelines, a culture must be performed on all samples regardless of the molecular test result. By ordering this test code, the client has assumed responsibility for the performance of the culture. This test was developed and its performance characteristics determined by [facility]. It has not been cleared or approved by the Food and Drug Administration.   . AFB Specimen Processing 12/07/2016 Concentration   Final   Comment: (NOTE) Performed At: [facility] [place], [place] [id number] Lindon Romp MD [id number]   Office Visit on 10/31/2016  Component Date Value Ref Range Status  . WBC 10/31/2016 10.2  3.8 - 10.8 K/uL Final  . RBC 10/31/2016 4.38  3.80 - 5.10 MIL/uL Final  . Hemoglobin 10/31/2016 11.5* 11.7 - 15.5 g/dL  Final  . HCT 10/31/2016 36.5  35.0 - 45.0 % Final  . MCV 10/31/2016 83.3  80.0 - 100.0 fL Final  . MCH 10/31/2016 26.3* 27.0 - 33.0 pg Final  . MCHC 10/31/2016 31.5* 32.0 - 36.0 g/dL Final  . RDW 10/31/2016 17.2* 11.0 - 15.0 % Final  . Platelets 10/31/2016 303  140 - 400 K/uL Final  . MPV 10/31/2016 9.2  7.5 - 12.5 fL Final  . Neutro Abs 10/31/2016 7140  1,500 - 7,800 cells/uL Final  . Lymphs Abs 10/31/2016 1734  850 - 3,900 cells/uL Final  . Monocytes Absolute 10/31/2016 918  200 -  950 cells/uL Final  . Eosinophils Absolute 10/31/2016 408  15 - 500 cells/uL Final  . Basophils Absolute 10/31/2016 0  0 - 200 cells/uL Final  . Neutrophils Relative % 10/31/2016 70  % Final  . Lymphocytes Relative 10/31/2016 17  % Final  . Monocytes Relative 10/31/2016 9  % Final  . Eosinophils Relative 10/31/2016 4  % Final  . Basophils Relative 10/31/2016 0  % Final  . Smear Review 10/31/2016 Criteria for review not met   Final  . Sodium 10/31/2016 137  135 - 146 mmol/L Final  . Potassium 10/31/2016 4.1  3.5 - 5.3 mmol/L Final  . Chloride 10/31/2016 103  98 - 110 mmol/L Final  . CO2 10/31/2016 27  20 - 31 mmol/L Final  . Glucose, Bld 10/31/2016 84  65 - 99 mg/dL Final  . BUN 10/31/2016 18  7 - 25 mg/dL Final  . Creat 10/31/2016 0.67  0.60 - 0.88 mg/dL Final   Comment:   For patients > or = 81 years of age: The upper reference limit for Creatinine is approximately 13% higher for people identified as African-American.     . Total Bilirubin 10/31/2016 0.5  0.2 - 1.2 mg/dL Final  . Alkaline Phosphatase 10/31/2016 73  33 - 130 U/L Final  . AST 10/31/2016 15  10 - 35 U/L Final  . ALT 10/31/2016 10  6 - 29 U/L Final  . Total Protein 10/31/2016 7.0  6.1 - 8.1 g/dL Final  . Albumin 10/31/2016 3.4* 3.6 - 5.1 g/dL Final  . Calcium 10/31/2016 9.0  8.6 - 10.4 mg/dL Final  . GFR, Est African American 10/31/2016 >89  >=60 mL/min Final  . GFR, Est Non African American 10/31/2016 82  >=60 mL/min Final  Office  Visit on 09/11/2016  Component Date Value Ref Range Status  . TSH 09/11/2016 2.350  0.450 - 4.500 uIU/mL Final  . Free T4 09/11/2016 1.38  0.82 - 1.77 ng/dL Final     Imaging: Ct Chest High Resolution  Result Date: 02/24/2017 CLINICAL DATA:  Follow-up bronchiectasis.  Intermittent dyspnea. EXAM: CT CHEST WITHOUT CONTRAST TECHNIQUE: Multidetector CT imaging of the chest was performed following the standard protocol without intravenous contrast. High resolution imaging of the lungs, as well as inspiratory and expiratory imaging, was performed. COMPARISON:  11/10/2016 high-resolution chest CT. FINDINGS: Cardiovascular: Normal heart size. No significant pericardial fluid/thickening. Left anterior descending, left circumflex and right coronary atherosclerosis. Thoracic aortic atherosclerosis with stable 4.2 cm ascending thoracic aortic aneurysm. Normal caliber pulmonary arteries. Mediastinum/Nodes: No discrete thyroid nodules. Unremarkable esophagus. No pathologically enlarged axillary, mediastinal or gross hilar lymph nodes, noting limited sensitivity for the detection of hilar adenopathy on this noncontrast study. Lungs/Pleura: No pneumothorax. No pleural effusion. No acute consolidative airspace disease or lung masses. There is mild-to-moderate cylindrical and varicoid bronchiectasis extensively distributed throughout both lungs involving all lung lobes, most prominent in the right middle lobe and lower lobes, not appreciably changed. There are associated moderate patchy tree-in-bud opacities throughout both lungs at the areas of bronchiectasis, overall not significantly changed, with mild improvement in the right lower lobe and mild worsening in the peripheral left upper lobe. Subpleural bandlike opacities with associated volume loss and distortion in the basilar right middle lobe and inferior segment lingula and additional scattered small parenchymal bands in the bilateral lower lobes are stable and  compatible with postinfectious scarring. No significant air trapping on the expiration sequence. Upper abdomen: Subcentimeter hypodense superior right liver lobe lesion is too small to  characterize and stable back to 05/31/2016, suggesting a benign lesion. Musculoskeletal: No aggressive appearing focal osseous lesions. Moderate thoracic spondylosis. Stable chronic mild compression deformity in the superior T11 vertebral body. IMPRESSION: 1. Persistent findings of chronic infectious bronchiolitis due to atypical mycobacterial infection (MAI) with extensively distributed cylindrical and varicoid bronchiectasis throughout both lungs and areas of tree-in-bud opacity and postinfectious scarring. Overall no significant change, with areas of mild improvement in the tree-in-bud opacities in the right lower lobe and mild worsening in the left upper lobe. 2. Aortic atherosclerosis. Stable ectatic 4.2 cm ascending thoracic aorta. Recommend annual imaging followup by CTA or MRA. This recommendation follows 2010 ACCF/AHA/AATS/ACR/ASA/SCA/SCAI/SIR/STS/SVM Guidelines for the Diagnosis and Management of Patients with Thoracic Aortic Disease. Circulation. 2010; 121: Y807-M666. 3. Three-vessel coronary atherosclerosis. Electronically Signed   By: Delbert Phenix M.D.   On: 02/24/2017 16:54    Speciality Comments: No specialty comments available.    Procedures:  No procedures performed Allergies: Patient has no known allergies.   Assessment / Plan:     Visit Diagnoses: Rheumatoid arthritis involving multiple sites with positive rheumatoid factor (HCC) - Positive rheumatoid factor, positive CCP, severe disease with contractures and erosions - Plan: CBC with Differential/Platelet, COMPLETE METABOLIC PANEL WITH GFR  Contractures involving both knees  Primary osteoarthritis of both knees - Severe end-stage due to osteoarthritis and rheumatoid arthritis overlap  Age-related osteoporosis with current pathological fracture  with routine healing, subsequent encounter  Bronchiectasis with acute exacerbation (HCC)  High risk medication use - Stop methotrexate due to exacerbation of pulmonary symptoms 02/28/2017: We consented the patient today on Arava 10 mg daily continue low dose; recheck in July - Plan: CBC with Differential/Platelet, COMPLETE METABOLIC PANEL WITH GFR   Plan: #1: Rheumatoid arthritis. History of positive rheumatoid factor and CCP. History of severe disease in contractures and erosions. Patient was doing well with methotrexate but unfortunately ended up having suspicion of Mycobacterium and therefore methotrexate was discontinued (on 11/30/2016 visit). Please see Epic for generic 03/16/2017 note.  She is currently not on any immunosuppressant. Her examination shows synovitis to the left second and third MCP joint. She has angulations and subluxations at PIP & DIP joints.  Currently her PCP gave her some tramadol to temporarily address her pain. Last night she had significant pain that kept her from sleeping.  In general, she has 50% of her days with pain as I can flare as high as a 9 but on average lives at a level of 5 on a pain scale of 0-10.  #2: High risk prescription. Was on methotrexate but had to stop methotrexate due to suspicion of Mycobacterium Patient saw Dr. Marchelle Gearing and has done a high-resolution CT.  #3: Consider sulfasalazine as a treatment option instead of methotrexate. Patient is not allergic to sulfasalazine. After careful consideration, [name] was selected as a treatment option for the patient. We will start her on 10 mg daily, check her blood in 2 weeks and maintain her on 10 mg daily. I will see her back in 3 months to see how well she is doing. I've asked the daughters to call me should she have any problems with [place] but if everything goes well, she can continue the [place].  #4: Patient may benefit from continuing tramadol that's prescribed by the PCP. I discussed  in detail the addictive nature of the medication and we may want to do 1 or 2 tablets at night but no more than that. Patient's two daughters are accompanying her and I was  able to get the history from them and discussed treatment options with them.  #5: Summary of Dr. Chase Caller office visit and high-risk CT done on 02/24/2017  ===>   IMPRESSION: 1. Persistent findings of chronic infectious bronchiolitis due to atypical mycobacterial infection  (MAI) with extensively distributed cylindrical and varicoid bronchiectasis throughout both lungs and areas of  tree-in-bud opacity and postinfectious scarring. Overall no significant change, with areas of mild improvement  in the tree-in-bud opacities in the right lower lobe and mild worsening in the left upper lobe. 2. Aortic  atherosclerosis. Stable ectatic 4.2 cm ascending thoracic aorta. Recommend annual imaging followup by  CTA or MRA.   Summary of February 12, 2017 office visit w/ dr. Chase Caller. Plan  -  do HRCT chest to see followup since dec 2017 - do this before end of this month so Dr Estanislado Pandy has   results  - depending on CT results and if you are feeling well, I do not see why we should not try immune modulator   again esp becuaes your RA pain is very disabling. I would not recommend TNF-alpha blocker class   right now due to highest infection risk but perhaps they could start with another class immune   modulator and assess  -Follow-up 3 months or sooner if needed  #6:Dr.Ramaswamy'snotefrom03/19/2018whereheheisagreeabletostartingthepatientonanotherclassimmunemodulator,wehavedeterminedthatAravamightbeagoodoptionforthepatient  Orders: Orders Placed This Encounter  Procedures  . CBC with Differential/Platelet  . COMPLETE METABOLIC PANEL WITH GFR   Meds ordered this encounter  Medications  . leflunomide (ARAVA) 10 MG tablet    Sig: Take 1 tablet (10 mg total) by mouth daily.    Dispense:  90 tablet    Refill:  0    Order Specific Question:    Supervising Provider    Answer:   Bo Merino [[id number]]  . traMADol (ULTRAM) 50 MG tablet    Sig: Take 1 tablet (50 mg total) by mouth at bedtime as needed for severe pain.    Dispense:  30 tablet    Refill:  2    Order Specific Question:   Supervising Provider    Answer:   Bo Merino [phone]    Face-to-face time spent with patient was 40 minutes. 50% of time was spent in counseling and coordination of care.  Follow-Up Instructions: Return in about 3 months (around 05/30/2017) for RA, Arava '10mg'$  qd, labs due 03/14/17 and q 27mo, hand pain., other jt pain.   NEliezer Lofts PA-C  Patient continues to be in a lot of discomfort. She is active synovitis and severe disease. Methotrexate was stopped after shortness of breath. Her pulmonary workup is consistent with bronchiectasis. I reviewed her records today. I also had detailed conversation with Dr. RChase Callertoday. He stated that it is uncertain if she had colonization or recent infection which is been treated. Her workup was negative for Mycobacterium. He was in agreement to start her on DMARD therapy due to severity of her rheumatoid arthritis. We decided not to put her on methotrexate again as she could have possible hypersensitivity to it. We agree on starting her on [place] I had detailed discussion with the patient and her daughters regarding indication and side effects of Arava. They were in agreement to proceed with [place]. HKoleen Nimrod pharmacist discussed AJolee Ewingwith the family at length. We will start her on low-dose Arava for right now and monitor her labs closely. I examined and evaluated the patient with NEliezer LoftsPA. The plan of care was discussed as noted above.  SBo Merino MD  Note - This record has been created using [employer].  Chart creation errors have been sought, but may not always  have been located. Such creation errors do not reflect on  the standard of medical care.

## 2017-02-23 ENCOUNTER — Ambulatory Visit (INDEPENDENT_AMBULATORY_CARE_PROVIDER_SITE_OTHER)
Admission: RE | Admit: 2017-02-23 | Discharge: 2017-02-23 | Disposition: A | Discharge status: Home / Self Care | Payer: Medicare HMO | Source: Ambulatory Visit | Attending: Internal Medicine | Admitting: Internal Medicine

## 2017-02-23 DIAGNOSIS — J479 Bronchiectasis, uncomplicated: Secondary | ICD-10-CM

## 2017-02-27 DIAGNOSIS — M6281 Muscle weakness (generalized): Secondary | ICD-10-CM | POA: Diagnosis not present

## 2017-02-27 DIAGNOSIS — M069 Rheumatoid arthritis, unspecified: Secondary | ICD-10-CM | POA: Diagnosis not present

## 2017-02-28 ENCOUNTER — Ambulatory Visit (INDEPENDENT_AMBULATORY_CARE_PROVIDER_SITE_OTHER): Payer: Medicare HMO | Admitting: Rheumatology

## 2017-02-28 ENCOUNTER — Encounter: Payer: Self-pay | Admitting: Rheumatology

## 2017-02-28 VITALS — BP 138/87 | HR 72 | Resp 13 | Ht 62.0 in | Wt 115.0 lb

## 2017-02-28 DIAGNOSIS — M170 Bilateral primary osteoarthritis of knee: Secondary | ICD-10-CM

## 2017-02-28 DIAGNOSIS — M0579 Rheumatoid arthritis with rheumatoid factor of multiple sites without organ or systems involvement: Secondary | ICD-10-CM

## 2017-02-28 DIAGNOSIS — M24562 Contracture, left knee: Secondary | ICD-10-CM

## 2017-02-28 DIAGNOSIS — Z79899 Other long term (current) drug therapy: Secondary | ICD-10-CM | POA: Diagnosis not present

## 2017-02-28 DIAGNOSIS — J471 Bronchiectasis with (acute) exacerbation: Secondary | ICD-10-CM | POA: Diagnosis not present

## 2017-02-28 DIAGNOSIS — M8000XD Age-related osteoporosis with current pathological fracture, unspecified site, subsequent encounter for fracture with routine healing: Secondary | ICD-10-CM

## 2017-02-28 DIAGNOSIS — M24561 Contracture, right knee: Secondary | ICD-10-CM

## 2017-02-28 MED ORDER — LEFLUNOMIDE 10 MG PO TABS: 10.0000 mg | ORAL_TABLET | Freq: Every day | ORAL | 0 refills | Status: DC

## 2017-02-28 MED ORDER — TRAMADOL HCL 50 MG PO TABS: 50.0000 mg | ORAL_TABLET | Freq: Every evening | ORAL | 2 refills | Status: AC | PRN

## 2017-02-28 NOTE — Progress Notes (Signed)
Pharmacy Note  Subjective: Patient presents today to the [facility] to see Dr. Gabrielle Dare. Whitney Mayer.  Patient seen by the pharmacist for counseling on leflunomide [name]).    Objective: CBC    Component Value Date/Time   WBC 10.2 10/31/2016 0926   RBC 4.38 10/31/2016 0926   HGB 11.5 (L) 10/31/2016 0926   HCT 36.5 10/31/2016 0926   HCT 40.3 07/04/2016 1552   PLT 303 10/31/2016 0926   PLT 379 07/04/2016 1552   MCV 83.3 10/31/2016 0926   MCV 82 07/04/2016 1552   MCH 26.3 (L) 10/31/2016 0926   MCHC 31.5 (L) 10/31/2016 0926   RDW 17.2 (H) 10/31/2016 0926   RDW 16.0 (H) 07/04/2016 1552   LYMPHSABS 1,734 10/31/2016 0926   MONOABS 918 10/31/2016 0926   EOSABS 408 10/31/2016 0926   BASOSABS 0 10/31/2016 0926   CMP     Component Value Date/Time   NA 137 10/31/2016 0926   NA 134 07/04/2016 1552   K 4.1 10/31/2016 0926   CL 103 10/31/2016 0926   CO2 27 10/31/2016 0926   GLUCOSE 84 10/31/2016 0926   BUN 18 10/31/2016 0926   BUN 15 07/04/2016 1552   CREATININE 0.67 10/31/2016 0926   CALCIUM 9.0 10/31/2016 0926   PROT 7.0 10/31/2016 0926   PROT 7.3 07/04/2016 1552   ALBUMIN 3.4 (L) 10/31/2016 0926   ALBUMIN 4.2 07/04/2016 1552   AST 15 10/31/2016 0926   ALT 10 10/31/2016 0926   ALKPHOS 73 10/31/2016 0926   BILITOT 0.5 10/31/2016 0926   BILITOT 0.4 07/04/2016 1552   GFRNONAA 82 10/31/2016 0926   GFRAA >89 10/31/2016 0926   TB Gold: negative (07/12/16)  Pregnancy status:  Post-menopause  Vitals:   02/28/17 1337  BP: 138/87  Pulse: 72  Resp: 13    Assessment/Plan: Patient is being initiated on leflunomide (Arava) 10 mg daily.  Patient was counseled on the purpose, proper use, and adverse effects of leflunomide including risk of infection, nausea/diarrhea/weight loss, increase in blood pressure, rash, hair loss, tingling in the hands and feet, and signs and symptoms of interstitial lung disease.  Discussed the importance of frequent monitoring of liver  function and blood counts, and patient was provided with instructions for standing labs.  Provided patient with educational materials on leflunomide and answered all questions.  Patient consented to [place] use, and consent will be uploaded into the media tab.  [name], Pharm.D., BCPS Clinical Pharmacist Pager: [phone] Phone: [phone] 02/28/2017 3:07 PM

## 2017-02-28 NOTE — Patient Instructions (Addendum)
Standing Labs We placed an order today for your standing lab work.    Please come back and get your standing labs in 2 weeks then every 2 months  We have open lab Monday through Friday from 8:30-11:30 AM and 1:30-4 PM at the office of Dr. Arbutus Ped, PA.   The office is located at [address], [address], [place], [place] [address] No appointment is necessary.   Labs are drawn by [employer].  You may receive a bill from [facility] for your lab work.    Leflunomide tablets Qu es este medicamento? La LEFLUNOMIDA se utiliza para la artritis reumatoide. Este medicamento puede ser utilizado para otros usos; si tiene alguna pregunta consulte con su proveedor de atencin mdica o con su farmacutico. MARCAS COMUNES: Arava Qu le debo informar a mi profesional de la salud antes de tomar este medicamento? Necesita saber si usted presenta o alguna vez present alguno de los siguientes problemas o situaciones: -alcoholismo -problemas en la mdula sea -fiebre o infeccin -problemas del sistema inmunolgico -enfermedad renal -enfermedad heptica -una reaccin alrgica o inusual a la leflunomida, teriflunomida, a otros medicamentos, lactosa, alimentos, colorantes o conservantes -si est embarazada o buscando quedar embarazada -si est amamantando a un beb Cmo debo utilizar este medicamento? Tome este medicamento por va oral con un vaso lleno de agua. Siga las instrucciones de la etiqueta del [place]. Tome sus dosis a intervalos regulares. No tome su medicamento con una frecuencia mayor a la indicada. No deje de tomarlo excepto si as lo indica su mdico. Hable con su pediatra para informarse acerca del uso de este medicamento en nios. Puede requerir atencin especial. Sobredosis: Pngase en contacto inmediatamente con un centro toxicolgico o una sala de urgencia si usted cree que haya tomado demasiado medicamento. ATENCIN: [employer] es solo para usted. No  comparta este medicamento con nadie. Qu sucede si me olvido de una dosis? Si olvida una dosis, tmela lo antes posible. Si es casi la hora de la prxima dosis, tome slo esa dosis. No tome dosis adicionales o dobles. Qu puede interactuar con este medicamento? No tome esta medicina con ninguno de los siguientes medicamentos: -teriflunomida Esta medicina tambin puede interactuar con los siguientes medicamentos: -carbn -colestiramina -metotrexato -AINE, medicamentos para el dolor o inflamacin, tales como ibuprofeno o naproxeno -fenitona -rifampicina -tolbutamida -vacunas -warfarina Puede ser que esta lista no menciona todas las posibles interacciones. Informe a su profesional de [employer] de [employer] productos a base de hierbas, medicamentos de [place] o suplementos nutritivos que est tomando. Si usted fuma, consume bebidas alcohlicas o si utiliza drogas ilegales, indqueselo tambin a su profesional de [employer]. Algunas sustancias pueden interactuar con su medicamento. A qu debo estar atento al usar [employer]? Visite a su mdico o a su profesional de la salud para chequear su evolucin peridicamente. Necesitar realizarse [employer] regulares de sangre mientras est recibiendo [employer]. Si desarrolla un resfro u otra infeccin mientras est tomando [employer], consulte a su mdico o a su profesional de [employer]. No se trate usted mismo. Este medicamento puede aumentar su riesgo de Marine scientist una infeccin. Las mujeres con posibilidades de quedar [place], consulten a su mdico o a su profesional de la salud sobre las distintas opciones de mtodos anticonceptivos. No debe estar embarazada y debe utilizar un mtodo anticonceptivo confiable. Este [employer] puede causar dao a un beb sin nacer. Si cree estar embarazada, debe consultar a su mdico inmediatamente. Las bebidas alcohlicas pueden aumentar la posibilidad  de daar su hgado. Evite las bebidas  alcohlicas mientras est tomando este medicamento. Qu efectos secundarios puedo tener al [employer] este medicamento? Efectos secundarios que debe informar a su mdico o a Producer, television/film/video de la salud tan pronto como sea posible: -Therapist, art como erupcin cutnea, picazn o urticarias, hinchazn de la cara, labios o lengua -tos -dificultad al respirar o falta de aliento -fiebre, escalofros u otros sntomas de una infeccin -enrojecimiento, formacin de ampollas, descamacin o distensin de la piel, inclusive dentro de la boca -sangrado o magulladuras inusuales -cansancio o debilidad inusual -vmito -color amarillento de los ojos o la piel Efectos secundarios que, por lo general, no requieren Psychologist, prison and probation services (debe informarlos a su mdico o a su profesional de la salud si persisten o si son molestos): -diarrea -cada del cabello -dolor de cabeza -nuseas Puede ser que esta lista no menciona todos los posibles efectos secundarios. Comunquese a su mdico por asesoramiento mdico [employer]. Usted puede informar los efectos secundarios a la FDA por telfono al 1-800-FDA-1088. Dnde debo guardar mi medicina? Mantngala fuera del alcance de los nios. Gurdela a [employer], entre 15 y 30 grados C (41 y 30 grados F). Protjala de la humedad y de Statistician. Deseche todo el medicamento que no haya utilizado, despus de la fecha de vencimiento. ATENCIN: Este folleto es un resumen. Puede ser que no cubra toda la posible informacin. Si usted tiene preguntas acerca de esta medicina, consulte con su mdico, su farmacutico o su profesional de Radiographer, therapeutic.  2018 Elsevier/Gold Standard (2015-01-05 00:00:00)

## 2017-03-11 DIAGNOSIS — M6281 Muscle weakness (generalized): Secondary | ICD-10-CM | POA: Diagnosis not present

## 2017-03-11 DIAGNOSIS — M069 Rheumatoid arthritis, unspecified: Secondary | ICD-10-CM | POA: Diagnosis not present

## 2017-03-15 ENCOUNTER — Other Ambulatory Visit: Payer: Self-pay | Admitting: *Deleted

## 2017-03-15 DIAGNOSIS — M0579 Rheumatoid arthritis with rheumatoid factor of multiple sites without organ or systems involvement: Secondary | ICD-10-CM | POA: Diagnosis not present

## 2017-03-15 DIAGNOSIS — Z79899 Other long term (current) drug therapy: Secondary | ICD-10-CM

## 2017-03-15 LAB — CBC WITH DIFFERENTIAL/PLATELET
BASOS PCT: 1 %
Basophils Absolute: 68 cells/uL (ref 0–200)
EOS PCT: 8 %
Eosinophils Absolute: 544 cells/uL — ABNORMAL HIGH (ref 15–500)
HCT: 36.4 % (ref 35.0–45.0)
Hemoglobin: 11.5 g/dL — ABNORMAL LOW (ref 11.7–15.5)
LYMPHS PCT: 24 %
Lymphs Abs: 1632 cells/uL (ref 850–3900)
MCH: 26.6 pg — ABNORMAL LOW (ref 27.0–33.0)
MCHC: 31.6 g/dL — AB (ref 32.0–36.0)
MCV: 84.1 fL (ref 80.0–100.0)
MONOS PCT: 10 %
MPV: 9.7 fL (ref 7.5–12.5)
Monocytes Absolute: 680 cells/uL (ref 200–950)
Neutro Abs: 3876 cells/uL (ref 1500–7800)
Neutrophils Relative %: 57 %
PLATELETS: 291 10*3/uL (ref 140–400)
RBC: 4.33 MIL/uL (ref 3.80–5.10)
RDW: 16.2 % — AB (ref 11.0–15.0)
WBC: 6.8 10*3/uL (ref 3.8–10.8)

## 2017-03-15 LAB — COMPLETE METABOLIC PANEL WITH GFR
ALK PHOS: 97 U/L (ref 33–130)
ALT: 29 U/L (ref 6–29)
AST: 28 U/L (ref 10–35)
Albumin: 3.3 g/dL — ABNORMAL LOW (ref 3.6–5.1)
BUN: 18 mg/dL (ref 7–25)
CHLORIDE: 104 mmol/L (ref 98–110)
CO2: 22 mmol/L (ref 20–31)
CREATININE: 0.64 mg/dL (ref 0.60–0.88)
Calcium: 8.8 mg/dL (ref 8.6–10.4)
GFR, Est African American: >89 mL/min (ref >=60)
GFR, Est Non African American: 83 mL/min (ref >=60)
Glucose, Bld: 58 mg/dL — ABNORMAL LOW (ref 65–99)
POTASSIUM: 3.9 mmol/L (ref 3.5–5.3)
Sodium: 138 mmol/L (ref 135–146)
Total Bilirubin: 0.5 mg/dL (ref 0.2–1.2)
Total Protein: 6.7 g/dL (ref 6.1–8.1)

## 2017-03-16 ENCOUNTER — Telehealth: Payer: Self-pay | Admitting: Radiology

## 2017-03-16 ENCOUNTER — Ambulatory Visit (INDEPENDENT_AMBULATORY_CARE_PROVIDER_SITE_OTHER): Payer: Medicare HMO | Admitting: Internal Medicine

## 2017-03-16 DIAGNOSIS — R51 Headache: Secondary | ICD-10-CM | POA: Diagnosis not present

## 2017-03-16 DIAGNOSIS — R519 Headache, unspecified: Secondary | ICD-10-CM

## 2017-03-16 NOTE — Progress Notes (Signed)
   CC: Headache  HPI:  [name] is a 83 y.o. female with a past medical history listed below here today with complaints of headaches.  For details of today's visit and the status of her chronic medical issues please refer to the assessment and plan.   Past Medical History:  Diagnosis Date  . Hypertension   . Hypothyroidism   . Pneumonia ~ 1943  . Rheumatoid arthritis (HCC)    "all over; hands, feet, knees, joints" (05/31/2016)  . Thyroid disease    Review of Systems:   See HPI  Physical Exam:  Vitals:   03/16/17 1456  BP: (!) 151/83  Pulse: 84  Temp: 98.5 F (36.9 C)  TempSrc: Oral  SpO2: 97%   Physical Exam  Constitutional: She is oriented to person, place, and time and well-developed, well-nourished, and in no distress.  Cardiovascular: Normal rate and regular rhythm.   Pulmonary/Chest: Effort normal and breath sounds normal.  Neurological: She is alert and oriented to person, place, and time. She has normal sensation, normal strength, normal reflexes and intact cranial nerves.  Vitals reviewed.   Assessment & Plan:   See Encounters Tab for problem based charting.  Patient discussed with Dr. Cyndie Chime

## 2017-03-16 NOTE — Telephone Encounter (Signed)
I have called patient to advise of lab results.  

## 2017-03-16 NOTE — Patient Instructions (Addendum)
Ms. Chalmers,   I think your headaches were form the medication. Please stop taking the Arvara. Follow up with your Rheumatologist.  The headache should go away as the medication washes out of your system. If you have worsening headaches next week please call us.

## 2017-03-16 NOTE — Telephone Encounter (Signed)
-----   Message from Naitik Panwala, PA-C sent at 03/16/2017  2:06 PM EDT -----  Tell pt #1: CMP with GFR is normal except for glucose slightly low at 58.; Albumin slightly low at 3.3. Patient should discuss this with PCP #2: CBC with differential is within normal limits except for ongoing anemia. Patient can discuss with PCP 

## 2017-03-19 ENCOUNTER — Telehealth: Payer: Self-pay | Admitting: Rheumatology

## 2017-03-19 ENCOUNTER — Ambulatory Visit: Payer: Medicare HMO

## 2017-03-19 ENCOUNTER — Telehealth: Payer: Self-pay | Admitting: Radiology

## 2017-03-19 DIAGNOSIS — R200 Anesthesia of skin: Secondary | ICD-10-CM | POA: Insufficient documentation

## 2017-03-19 NOTE — Telephone Encounter (Signed)
-----   Message from [place], [place] sent at 03/16/2017  2:06 PM EDT -----  Tell pt #1: CMP with GFR is normal except for glucose slightly low at 58.; Albumin slightly low at 3.3. Patient should discuss this with PCP #2: CBC with differential is within normal limits except for ongoing anemia. Patient can discuss with PCP

## 2017-03-19 NOTE — Assessment & Plan Note (Signed)
Whitney Mayer presents today with her daughter complaining of severe acute onset headache. She reports that she was recently seen by the Rheumatologist and started on a new medication, leflunomide (arvara). She reports that after starting the new medication she developed acute onset right sided headaches. Reports that the headaches were worst at night and lasting for 3 days. Her daughter told her to stop the new medication and the headache has been slowly improving. She denies any current headache but is scared of having another one. Unable to fully describe the headache other than it felt like something inside was trying to force its way out. No vision changes, sensory changes, or neuolgoic symptoms.  Neuro exam completely intact today.  Plan: Will d/c Leflunomide today (headache reported 7-13% of the time). No exam findings concerning for stroke or aneurysm today. Return precautions given.   F/U with rheumatologist

## 2017-03-19 NOTE — Telephone Encounter (Signed)
Left message for her to call back for lab results  

## 2017-03-19 NOTE — Telephone Encounter (Signed)
Patient's daughter [name] returned a phone call about lab results for patient. Please call her back at [phone].

## 2017-03-19 NOTE — Progress Notes (Signed)
Medicine attending: Medical history, presenting problems, physical findings, and medications, reviewed with resident physician Dr Nathan Boswell on the day of the patient visit and I concur with his evaluation and management plan. 

## 2017-03-19 NOTE — Telephone Encounter (Signed)
I have called patient to advise labs are normal  

## 2017-03-21 NOTE — Telephone Encounter (Signed)
I have called patient to advise labs are c/w previous discuss with PCP

## 2017-03-29 DIAGNOSIS — M069 Rheumatoid arthritis, unspecified: Secondary | ICD-10-CM | POA: Diagnosis not present

## 2017-03-29 DIAGNOSIS — M6281 Muscle weakness (generalized): Secondary | ICD-10-CM | POA: Diagnosis not present

## 2017-04-10 DIAGNOSIS — M069 Rheumatoid arthritis, unspecified: Secondary | ICD-10-CM | POA: Diagnosis not present

## 2017-04-10 DIAGNOSIS — M6281 Muscle weakness (generalized): Secondary | ICD-10-CM | POA: Diagnosis not present

## 2017-04-12 DIAGNOSIS — Z961 Presence of intraocular lens: Secondary | ICD-10-CM | POA: Diagnosis not present

## 2017-04-12 DIAGNOSIS — H04543 Stenosis of bilateral lacrimal canaliculi: Secondary | ICD-10-CM | POA: Diagnosis not present

## 2017-04-12 DIAGNOSIS — H25811 Combined forms of age-related cataract, right eye: Secondary | ICD-10-CM | POA: Diagnosis not present

## 2017-04-16 DIAGNOSIS — H04412 Chronic dacryocystitis of left lacrimal passage: Secondary | ICD-10-CM | POA: Diagnosis not present

## 2017-04-16 DIAGNOSIS — H04223 Epiphora due to insufficient drainage, bilateral lacrimal glands: Secondary | ICD-10-CM | POA: Diagnosis not present

## 2017-04-16 DIAGNOSIS — H04552 Acquired stenosis of left nasolacrimal duct: Secondary | ICD-10-CM | POA: Diagnosis not present

## 2017-04-16 DIAGNOSIS — H04222 Epiphora due to insufficient drainage, left lacrimal gland: Secondary | ICD-10-CM | POA: Diagnosis not present

## 2017-04-16 DIAGNOSIS — H04221 Epiphora due to insufficient drainage, right lacrimal gland: Secondary | ICD-10-CM | POA: Diagnosis not present

## 2017-04-16 DIAGNOSIS — H04411 Chronic dacryocystitis of right lacrimal passage: Secondary | ICD-10-CM | POA: Diagnosis not present

## 2017-04-16 DIAGNOSIS — H04553 Acquired stenosis of bilateral nasolacrimal duct: Secondary | ICD-10-CM | POA: Diagnosis not present

## 2017-04-16 DIAGNOSIS — H11432 Conjunctival hyperemia, left eye: Secondary | ICD-10-CM | POA: Diagnosis not present

## 2017-04-16 DIAGNOSIS — H04551 Acquired stenosis of right nasolacrimal duct: Secondary | ICD-10-CM | POA: Diagnosis not present

## 2017-04-29 DIAGNOSIS — M6281 Muscle weakness (generalized): Secondary | ICD-10-CM | POA: Diagnosis not present

## 2017-04-29 DIAGNOSIS — M069 Rheumatoid arthritis, unspecified: Secondary | ICD-10-CM | POA: Diagnosis not present

## 2017-05-11 DIAGNOSIS — M069 Rheumatoid arthritis, unspecified: Secondary | ICD-10-CM | POA: Diagnosis not present

## 2017-05-11 DIAGNOSIS — M6281 Muscle weakness (generalized): Secondary | ICD-10-CM | POA: Diagnosis not present

## 2017-05-14 NOTE — Progress Notes (Deleted)
Office Visit Note  Patient: Whitney Mayer             Date of Birth: 1932/04/08           MRN: [id number]             PCP: Ledell Noss, MD Referring: Ledell Noss, MD Visit Date: 05/15/2017 Occupation: _0 @    Subjective:  No chief complaint on file.   History of Present Illness: Whitney Mayer is a 83 y.o. female  Last seen 02/28/2017. Patient has rheumatoid arthritis with positive rheumatoid factor and CCP. Has severe disease with contractures and erosions.  On the last office visit in April, patient reported that in January 2018, she ended up having bronchiectasis while she was on methotrexate. She followed up with Dr. Chase Caller for this. 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 She had developed shortness of breath and due to these events, she had stopped taking taking methotrexate. Note: At the last visit in April, she showed synovitis of the left second and third MCP joint as well as angulations and subluxations at the PIP and DIP joints.  When she was on methotrexate at 4 pills per week, she was doing well. Since she stopped her methotrexate, at the last visit, we ended up starting the patient on Arava 10 mg daily for 2 weeks and then keep her on 10 mg daily and follow-up with her today.***  Today,***   Activities of Daily Living:  Patient reports morning stiffness for *** {minute/hour:19697}.   Patient {ACTIONS;DENIES/REPORTS:21021675::"Denies"} nocturnal pain.  Difficulty dressing/grooming: {ACTIONS;DENIES/REPORTS:21021675::"Denies"} Difficulty climbing stairs: {ACTIONS;DENIES/REPORTS:21021675::"Denies"} Difficulty getting out of chair: {ACTIONS;DENIES/REPORTS:21021675::"Denies"} Difficulty using hands for taps, buttons, cutlery, and/or writing: {ACTIONS;DENIES/REPORTS:21021675::"Denies"}   Review of Systems  Constitutional: Negative for fatigue.  HENT: Negative for mouth sores and mouth dryness.   Eyes: Negative for dryness.  Respiratory:  Negative for shortness of breath.   Gastrointestinal: Negative for constipation and diarrhea.  Musculoskeletal: Negative for myalgias and myalgias.  Skin: Negative for sensitivity to sunlight.  Psychiatric/Behavioral: Negative for decreased concentration and sleep disturbance.    PMFS History:  Patient Active Problem List   Diagnosis Date Noted  . Headache 03/19/2017  . Aortic atherosclerosis ([place]) 02/15/2017  . Contractures involving both knees 11/29/2016  . Primary osteoarthritis of both knees 11/29/2016  . Primary osteoarthritis of both hips 11/29/2016  . Bronchiectasis without acute exacerbation ([place]) 11/29/2016  . Pulmonary infiltrates 11/15/2016  . Pruritus 10/29/2016  . High risk medication use 10/12/2016  . No blood products 09/11/2016  . Hypertension 06/08/2016  . Osteoporosis 06/08/2016  . Hashimoto's thyroiditis 06/01/2016  . Thoracic ascending aortic aneurysm ([place]) 06/01/2016  . Rheumatoid arthritis involving multiple sites ([place]) 06/01/2016  . Coronary artery calcification 06/01/2016    Past Medical History:  Diagnosis Date  . Hypertension   . Hypothyroidism   . Pneumonia ~ 1943  . Rheumatoid arthritis ([place])    "all over; hands, feet, knees, joints" (05/31/2016)  . Thyroid disease     Family History  Problem Relation Age of Onset  . Stroke Mother   . Kidney disease Brother    Past Surgical History:  Procedure Laterality Date  . CARDIAC CATHETERIZATION N/A 06/02/2016   Procedure: Left Heart Cath and Coronary Angiography;  Surgeon: Belva Crome, MD;  Location: [facility];  Service: Cardiovascular;  Laterality: N/A;  . CARDIOVERSION N/A 06/02/2016   Procedure: CARDIOVERSION;  Surgeon: Lorretta Harp, MD;  Location: [facility];  Service: Cardiovascular;  Laterality: N/A;  .  DILATION AND CURETTAGE OF UTERUS  1980s  . TUBAL LIGATION  1960s  . VIDEO BRONCHOSCOPY Bilateral 12/07/2016   Procedure: VIDEO BRONCHOSCOPY WITHOUT FLUORO;  Surgeon: Brand Males, MD;   Location: [facility];  Service: Cardiopulmonary;  Laterality: Bilateral;   Social History   Social History Narrative  . No narrative on file     Objective: Vital Signs: LMP  (LMP Unknown)    Physical Exam  Constitutional: She is oriented to person, place, and time. She appears well-developed and well-nourished.  HENT:  Head: Normocephalic and atraumatic.  Eyes: EOM are normal. Pupils are equal, round, and reactive to light.  Cardiovascular: Normal rate, regular rhythm and normal heart sounds.  Exam reveals no gallop and no friction rub.   No murmur heard. Pulmonary/Chest: Effort normal and breath sounds normal. She has no wheezes. She has no rales.  Abdominal: Soft. Bowel sounds are normal. She exhibits no distension. There is no tenderness. There is no guarding. No hernia.  Musculoskeletal: Normal range of motion. She exhibits no edema, tenderness or deformity.  Lymphadenopathy:    She has no cervical adenopathy.  Neurological: She is alert and oriented to person, place, and time. Coordination normal.  Skin: Skin is warm and dry. Capillary refill takes less than 2 seconds. No rash noted.  Psychiatric: She has a normal mood and affect. Her behavior is normal.  Nursing note and vitals reviewed.    Musculoskeletal Exam: ***  CDAI Exam: No CDAI exam completed.    Investigation: No additional findings.  Orders Only on 03/15/2017  Component Date Value Ref Range Status  . WBC 03/15/2017 6.8  3.8 - 10.8 K/uL Final  . RBC 03/15/2017 4.33  3.80 - 5.10 MIL/uL Final  . Hemoglobin 03/15/2017 11.5* 11.7 - 15.5 g/dL Final  . HCT 03/15/2017 36.4  35.0 - 45.0 % Final  . MCV 03/15/2017 84.1  80.0 - 100.0 fL Final  . MCH 03/15/2017 26.6* 27.0 - 33.0 pg Final  . MCHC 03/15/2017 31.6* 32.0 - 36.0 g/dL Final  . RDW 03/15/2017 16.2* 11.0 - 15.0 % Final  . Platelets 03/15/2017 291  140 - 400 K/uL Final  . MPV 03/15/2017 9.7  7.5 - 12.5 fL Final  . Neutro Abs 03/15/2017 3876  1,500 -  7,800 cells/uL Final  . Lymphs Abs 03/15/2017 1632  850 - 3,900 cells/uL Final  . Monocytes Absolute 03/15/2017 680  200 - 950 cells/uL Final  . Eosinophils Absolute 03/15/2017 544* 15 - 500 cells/uL Final  . Basophils Absolute 03/15/2017 68  0 - 200 cells/uL Final  . Neutrophils Relative % 03/15/2017 57  % Final  . Lymphocytes Relative 03/15/2017 24  % Final  . Monocytes Relative 03/15/2017 10  % Final  . Eosinophils Relative 03/15/2017 8  % Final  . Basophils Relative 03/15/2017 1  % Final  . Smear Review 03/15/2017 Criteria for review not met   Final  . Sodium 03/15/2017 138  135 - 146 mmol/L Final  . Potassium 03/15/2017 3.9  3.5 - 5.3 mmol/L Final  . Chloride 03/15/2017 104  98 - 110 mmol/L Final  . CO2 03/15/2017 22  20 - 31 mmol/L Final  . Glucose, Bld 03/15/2017 58* 65 - 99 mg/dL Final  . BUN 03/15/2017 18  7 - 25 mg/dL Final  . Creat 03/15/2017 0.64  0.60 - 0.88 mg/dL Final   Comment:   For patients > or = 81 years of age: The upper reference limit for Creatinine is approximately 13% higher for  people identified as African-American.     . Total Bilirubin 03/15/2017 0.5  0.2 - 1.2 mg/dL Final  . Alkaline Phosphatase 03/15/2017 97  33 - 130 U/L Final  . AST 03/15/2017 28  10 - 35 U/L Final  . ALT 03/15/2017 29  6 - 29 U/L Final  . Total Protein 03/15/2017 6.7  6.1 - 8.1 g/dL Final  . Albumin 03/15/2017 3.3* 3.6 - 5.1 g/dL Final  . Calcium 03/15/2017 8.8  8.6 - 10.4 mg/dL Final  . GFR, Est African American 03/15/2017 >89  >=60 mL/min Final  . GFR, Est Non African American 03/15/2017 83  >=60 mL/min Final  Admission on 12/07/2016, Discharged on 12/07/2016  Component Date Value Ref Range Status  . Specimen Description 12/07/2016 BRONCHIAL ALVEOLAR LAVAGE   Final  . Special Requests 12/07/2016 Immunocompromised   Final  . Gram Stain 12/07/2016    Final                   Value:FEW WBC PRESENT, PREDOMINANTLY PMN RARE GRAM POSITIVE COCCI IN PAIRS   . Culture 12/07/2016 40,000  COLONIES/mL PSEUDOMONAS AERUGINOSA*  Final  . Report Status 12/07/2016 12/11/2016 FINAL   Final  . Organism ID, Bacteria 12/07/2016 PSEUDOMONAS AERUGINOSA*  Final  . Specimen Source-[facility] 12/07/2016 BRONCHIAL ALVEOLAR LAVAGE   Final  . Pneumocystis jiroveci Ag 12/07/2016 NEGATIVE   Final   Performed at [facility]  . Fluid Type-FCT 12/07/2016 BRONCHIAL ALVEOLAR LAVAGE   Final  . Color, Fluid 12/07/2016 COLORLESS* YELLOW Final  . Appearance, Fluid 12/07/2016 CLOUDY* CLEAR Final  . WBC, Fluid 12/07/2016 235  0 - 1,000 cu mm Final  . Neutrophil Count, Fluid 12/07/2016 97* 0 - 25 % Final  . Lymphs, Fluid 12/07/2016 1  % Final  . Monocyte-Macrophage-Serous Fluid 12/07/2016 1* 50 - 90 % Final  . Eos, Fluid 12/07/2016 1  % Final  . Other Cells, Fluid 12/07/2016 FEW MESOTHELIAL CELLS PRESENT  % Final  . AFB Specimen Processing 12/07/2016 Concentration   Final  . Acid Fast Smear 12/07/2016 Negative   Final   Comment: (NOTE) Performed At: [facility] [place], [place] [id number] Lindon Romp MD OZ:[id number]   . Source (AFB) 12/07/2016 BRONCHIAL ALVEOLAR LAVAGE   Final  . Acid Fast Culture 12/07/2016 Negative   Final   Comment: (NOTE) No acid fast bacilli isolated after 6 weeks. Performed At: [facility] [place], [place] [id number] Lindon Romp MD KG:[id number]   . Source of Sample 12/07/2016 BRONCHIAL ALVEOLAR LAVAGE   Final  . Specimen Description 12/07/2016 BRONCHIAL ALVEOLAR LAVAGE   Final  . Special Requests 12/07/2016 Immunocompromised   Final  . Culture 12/07/2016 NO FUNGUS ISOLATED AFTER 21 DAYS   Final  . Report Status 12/07/2016 12/28/2016 FINAL   Final  . M Tuberculosis, Naa 12/07/2016 Negative  Negative Final   Comment: (NOTE) Validation studies at [facility] have demonstrated that PCR testing is highly sensitive for the detection of Mycobacterium tuberculosis complex in smear negative or smear positive  specimens. PCR results should be used in conjunction with laboratory test results and the patient's clinical profile. Per the College of American Pathologists (CAP) guidelines, a culture must be performed on all samples regardless of the molecular test result. By ordering this test code, the client has assumed responsibility for the performance of the culture. This test was developed and its performance characteristics determined by [facility]. It has not been cleared or approved by  the Food and Drug Administration.   . AFB Specimen Processing 12/07/2016 Concentration   Final   Comment: (NOTE) Performed At: [facility] [place], [place] [id number] Lindon Romp MD EH:[id number]      Imaging: No results found.  Speciality Comments: No specialty comments available.    Procedures:  No procedures performed Allergies: Patient has no known allergies.   Assessment / Plan:     Visit Diagnoses: No diagnosis found.   Plan: #1: Rheumatoid arthritis. Severe.  #2 high risk prescription Last labs were April 2018 and are within normal limits Arava 10 mg daily***  #3: Patient is in significant pain in the past and was getting tramadol from her PCP but we took over and we gave her prescription at last visit.***  Orders: No orders of the defined types were placed in this encounter.  No orders of the defined types were placed in this encounter.   Face-to-face time spent with patient was 30 minutes. 50% of time was spent in counseling and coordination of care.  Follow-Up Instructions: No Follow-up on file.   Eliezer Lofts, PA-C  Note - This record has been created using [employer].  Chart creation errors have been sought, but may not always  have been located. Such creation errors do not reflect on  the standard of medical care.

## 2017-05-15 ENCOUNTER — Ambulatory Visit: Payer: Medicare HMO | Admitting: Rheumatology

## 2017-05-29 ENCOUNTER — Ambulatory Visit: Payer: Medicare HMO | Admitting: Rheumatology

## 2017-05-29 ENCOUNTER — Encounter: Payer: Self-pay | Admitting: Rheumatology

## 2017-05-29 ENCOUNTER — Ambulatory Visit (INDEPENDENT_AMBULATORY_CARE_PROVIDER_SITE_OTHER): Payer: Medicare HMO | Admitting: Rheumatology

## 2017-05-29 VITALS — BP 112/66 | HR 76 | Resp 12 | Ht 62.0 in | Wt 115.0 lb

## 2017-05-29 DIAGNOSIS — M6281 Muscle weakness (generalized): Secondary | ICD-10-CM | POA: Diagnosis not present

## 2017-05-29 DIAGNOSIS — M24561 Contracture, right knee: Secondary | ICD-10-CM | POA: Diagnosis not present

## 2017-05-29 DIAGNOSIS — M542 Cervicalgia: Secondary | ICD-10-CM | POA: Diagnosis not present

## 2017-05-29 DIAGNOSIS — M170 Bilateral primary osteoarthritis of knee: Secondary | ICD-10-CM | POA: Diagnosis not present

## 2017-05-29 DIAGNOSIS — J471 Bronchiectasis with (acute) exacerbation: Secondary | ICD-10-CM | POA: Diagnosis not present

## 2017-05-29 DIAGNOSIS — M24562 Contracture, left knee: Secondary | ICD-10-CM | POA: Diagnosis not present

## 2017-05-29 DIAGNOSIS — M069 Rheumatoid arthritis, unspecified: Secondary | ICD-10-CM | POA: Diagnosis not present

## 2017-05-29 DIAGNOSIS — M8000XD Age-related osteoporosis with current pathological fracture, unspecified site, subsequent encounter for fracture with routine healing: Secondary | ICD-10-CM

## 2017-05-29 DIAGNOSIS — Z79899 Other long term (current) drug therapy: Secondary | ICD-10-CM

## 2017-05-29 DIAGNOSIS — M0579 Rheumatoid arthritis with rheumatoid factor of multiple sites without organ or systems involvement: Secondary | ICD-10-CM

## 2017-05-29 DIAGNOSIS — M160 Bilateral primary osteoarthritis of hip: Secondary | ICD-10-CM

## 2017-05-29 NOTE — Progress Notes (Signed)
Office Visit Note  Patient: Whitney Mayer             Date of Birth: Apr 21, 1932           MRN: [id number]             PCP: Eulah Pont, MD Referring: Eulah Pont, MD Visit Date: 05/29/2017 Occupation: @GUAROCC @    Subjective:  Joint pain and stiffness.   History of Present Illness: Whitney Mayer is a 83 y.o. female with sero positive rheumatoid arthritis. She initially discontinued methotrexate due to bronchiectasis. She tried 97 and discontinued as she felt it was causing headaches. She continues to have some stiffness and pain in her hands and other joints. She does not want to take any DMARD therapy. She continues to have headaches which are occipital. She denies any visual changes. She's never had headaches before.  Activities of Daily Living:  Patient reports morning stiffness for all day hours.   Patient Reports nocturnal pain.  Difficulty dressing/grooming: Reports Difficulty climbing stairs: Reports Difficulty getting out of chair: Reports Difficulty using hands for taps, buttons, cutlery, and/or writing: Reports   Review of Systems  Constitutional: Positive for fatigue and weakness. Negative for night sweats, weight gain and weight loss.  HENT: Negative for mouth sores, trouble swallowing, trouble swallowing, mouth dryness and nose dryness.   Eyes: Negative for pain, redness, visual disturbance and dryness.  Respiratory: Negative for cough, shortness of breath and difficulty breathing.   Cardiovascular: Negative for chest pain, palpitations, hypertension, irregular heartbeat and swelling in legs/feet.  Gastrointestinal: Negative for blood in stool, constipation and diarrhea.  Endocrine: Negative for increased urination.  Genitourinary: Negative for vaginal dryness.  Musculoskeletal: Positive for arthralgias, joint pain and morning stiffness. Negative for joint swelling, myalgias, muscle weakness, muscle tenderness and myalgias.  Skin: Negative for color change, rash,  hair loss, skin tightness, ulcers and sensitivity to sunlight.  Allergic/Immunologic: Negative for susceptible to infections.  Neurological: Positive for headaches. Negative for dizziness, memory loss and night sweats.  Hematological: Negative for swollen glands.  Psychiatric/Behavioral: Positive for sleep disturbance. Negative for depressed mood. The patient is nervous/anxious.     PMFS History:  Patient Active Problem List   Diagnosis Date Noted  . Headache 03/19/2017  . Aortic atherosclerosis (HCC) 02/15/2017  . Contractures involving both knees 11/29/2016  . Primary osteoarthritis of both knees 11/29/2016  . Primary osteoarthritis of both hips 11/29/2016  . Bronchiectasis without acute exacerbation (HCC) 11/29/2016  . Pulmonary infiltrates 11/15/2016  . Pruritus 10/29/2016  . High risk medication use 10/12/2016  . No blood products 09/11/2016  . Hypertension 06/08/2016  . Osteoporosis 06/08/2016  . Hashimoto's thyroiditis 06/01/2016  . Thoracic ascending aortic aneurysm (HCC) 06/01/2016  . Rheumatoid arthritis involving multiple sites (HCC) 06/01/2016  . Coronary artery calcification 06/01/2016    Past Medical History:  Diagnosis Date  . Hypertension   . Hypothyroidism   . Pneumonia ~ 1943  . Rheumatoid arthritis (HCC)    "all over; hands, feet, knees, joints" (05/31/2016)  . Thyroid disease     Family History  Problem Relation Age of Onset  . Stroke Mother   . Kidney disease Brother    Past Surgical History:  Procedure Laterality Date  . CARDIAC CATHETERIZATION N/A 06/02/2016   Procedure: Left Heart Cath and Coronary Angiography;  Surgeon: 08/03/2016, MD;  Location: [facility] INVASIVE CV LAB;  Service: Cardiovascular;  Laterality: N/A;  . CARDIOVERSION N/A 06/02/2016   Procedure: CARDIOVERSION;  Surgeon: 08/03/2016,  MD;  Location: [facility] OR;  Service: Cardiovascular;  Laterality: N/A;  . DILATION AND CURETTAGE OF UTERUS  1980s  . TUBAL LIGATION  1960s  . VIDEO  BRONCHOSCOPY Bilateral 12/07/2016   Procedure: VIDEO BRONCHOSCOPY WITHOUT FLUORO;  Surgeon: Kalman Shan, MD;  Location: [facility] ENDOSCOPY;  Service: Cardiopulmonary;  Laterality: Bilateral;   Social History   Social History Narrative  . No narrative on file     Objective: Vital Signs: BP 112/66   Pulse 76   Resp 12   Ht 5\' 2"  (1.575 m)   Wt 115 lb (52.2 kg)   LMP  (LMP Unknown)   BMI 21.03 kg/m    Physical Exam  Constitutional: She is oriented to person, place, and time. She appears well-developed and well-nourished.  HENT:  Head: Normocephalic and atraumatic.  Eyes: Conjunctivae and EOM are normal.  Neck: Normal range of motion.  Cardiovascular: Normal rate, regular rhythm, normal heart sounds and intact distal pulses.   Pulmonary/Chest: Effort normal and breath sounds normal.  Abdominal: Soft. Bowel sounds are normal.  Lymphadenopathy:    She has no cervical adenopathy.  Neurological: She is alert and oriented to person, place, and time.  Skin: Skin is warm and dry. Capillary refill takes less than 2 seconds.  Psychiatric: She has a normal mood and affect. Her behavior is normal.  Nursing note and vitals reviewed.    Musculoskeletal Exam: She is painful range of motion of her C-spine with lateral rotation and flexion and extension. She denies any radiculopathy. She has some thoracic kyphosis. Shoulder joints elbow joints are good range of motion. She has subluxation and synovial thickening of all of her MCP joints without any active synovitis. She has contractures at several of her PIPs and MCP joints. Hip joints were difficult to assess in the wheelchair. She has contractures in her knee joints. No active synovitis was noted.  CDAI Exam: CDAI Homunculus Exam:   Joint Counts:  CDAI Tender Joint count: 0 CDAI Swollen Joint count: 0  Global Assessments:  Patient Global Assessment: 8 Provider Global Assessment: 8  CDAI Calculated Score: 16    Investigation: No  additional findings.   Imaging: No results found.  Speciality Comments: No specialty comments available.    Procedures:  No procedures performed Allergies: Patient has no known allergies.   Assessment / Plan:     Visit Diagnoses: Rheumatoid arthritis involving multiple sites with positive rheumatoid factor (HCC) - Positive RF, positive anti-CCP, contractures and erosions. Patient has tried methotrexate and discontinued because of excessive patient of bronchiectasis. She did not like Arava she thought it caused headaches and also she is concerned about the side effects. After detailed discussion patient and her daughter felt that did she is better off not taking any immunosuppressive agents. She has chronic pain and discomfort but not much joint swelling. She mostly of burnt out rheumatoid arthritis.  Neck pain. She's been having increased neck pain and discomfort. - Plan: DG Cervical Spine With Flex & Extension. I will contact her once we get C-spine x-ray results.  High risk medication use - Methotrexate discontinued due to bronchiectasis. Patient stopped Arava due to headaches.  Contractures involving both knees  Primary osteoarthritis of both hips  Primary osteoarthritis of both knees  Bronchiectasis with acute exacerbation (HCC)  Age-related osteoporosis with current pathological fracture with routine healing, subsequent encounter   Patient wants to return only on when necessary basis she states she will follow-up with her PCP. It is cumbersome for her to  visit many specialist.  Orders: Orders Placed This Encounter  Procedures  . DG Cervical Spine With Flex & Extend   No orders of the defined types were placed in this encounter.   Face-to-face time spent with patient was 30 minutes. 50% of time was spent in counseling and coordination of care.  Follow-Up Instructions: Return for Rheumatoid arthritis, Osteoarthritis.   Pollyann Savoy, MD  Note - This record has  been created using Animal nutritionist.  Chart creation errors have been sought, but may not always  have been located. Such creation errors do not reflect on  the standard of medical care.

## 2017-06-04 ENCOUNTER — Ambulatory Visit: Payer: Commercial Managed Care - HMO | Admitting: Internal Medicine

## 2017-06-05 ENCOUNTER — Other Ambulatory Visit: Payer: Self-pay | Admitting: Rheumatology

## 2017-06-05 ENCOUNTER — Ambulatory Visit (HOSPITAL_COMMUNITY)
Admission: RE | Admit: 2017-06-05 | Discharge: 2017-06-05 | Disposition: A | Discharge status: Home / Self Care | Payer: Medicare HMO | Source: Ambulatory Visit | Attending: Rheumatology | Admitting: Rheumatology

## 2017-06-05 ENCOUNTER — Ambulatory Visit: Payer: Commercial Managed Care - HMO | Admitting: Internal Medicine

## 2017-06-05 DIAGNOSIS — M542 Cervicalgia: Secondary | ICD-10-CM

## 2017-06-05 DIAGNOSIS — M50323 Other cervical disc degeneration at C6-C7 level: Secondary | ICD-10-CM | POA: Diagnosis not present

## 2017-06-05 DIAGNOSIS — M47812 Spondylosis without myelopathy or radiculopathy, cervical region: Secondary | ICD-10-CM | POA: Insufficient documentation

## 2017-06-05 NOTE — Progress Notes (Signed)
DDD no other changes.

## 2017-06-06 ENCOUNTER — Ambulatory Visit: Payer: Medicare HMO | Admitting: Internal Medicine

## 2017-06-10 DIAGNOSIS — M069 Rheumatoid arthritis, unspecified: Secondary | ICD-10-CM | POA: Diagnosis not present

## 2017-06-10 DIAGNOSIS — M6281 Muscle weakness (generalized): Secondary | ICD-10-CM | POA: Diagnosis not present

## 2017-06-29 DIAGNOSIS — M069 Rheumatoid arthritis, unspecified: Secondary | ICD-10-CM | POA: Diagnosis not present

## 2017-06-29 DIAGNOSIS — M6281 Muscle weakness (generalized): Secondary | ICD-10-CM | POA: Diagnosis not present

## 2017-07-11 DIAGNOSIS — M069 Rheumatoid arthritis, unspecified: Secondary | ICD-10-CM | POA: Diagnosis not present

## 2017-07-11 DIAGNOSIS — M6281 Muscle weakness (generalized): Secondary | ICD-10-CM | POA: Diagnosis not present

## 2017-07-30 DIAGNOSIS — M069 Rheumatoid arthritis, unspecified: Secondary | ICD-10-CM | POA: Diagnosis not present

## 2017-07-30 DIAGNOSIS — M6281 Muscle weakness (generalized): Secondary | ICD-10-CM | POA: Diagnosis not present

## 2017-08-07 ENCOUNTER — Ambulatory Visit: Payer: Medicaid Other | Admitting: Internal Medicine

## 2017-08-11 DIAGNOSIS — M069 Rheumatoid arthritis, unspecified: Secondary | ICD-10-CM | POA: Diagnosis not present

## 2017-08-11 DIAGNOSIS — M6281 Muscle weakness (generalized): Secondary | ICD-10-CM | POA: Diagnosis not present

## 2017-08-27 DIAGNOSIS — L509 Urticaria, unspecified: Secondary | ICD-10-CM | POA: Diagnosis not present

## 2017-09-03 NOTE — Progress Notes (Signed)
CC: follow-up of hypertension  HPI:  [name] is a 83 y.o. with PMH HTN, rheumatoid arthritis, osteoporosis, osteoarthritis, hashimoto's thyroiditis, thoracic aortic aneurysm  who presents for follow up of hypertension and hypothyroidism with new concerns of right scalp numbness. Please see the assessment and plans for the status of the patient chronic medical problems.    Past Medical History:  Diagnosis Date  . Hypertension   . Hypothyroidism   . Pneumonia ~ 1943  . Rheumatoid arthritis (HCC)    "all over; hands, feet, knees, joints" (05/31/2016)  . Thyroid disease    Review of Systems: Refer to history of present illness and assessment and plans for pertinent review of systems, all others reviewed and negative  Physical Exam:  Vitals:   09/04/17 1403  BP: (!) 154/77  Pulse: 81  Temp: 97.8 F (36.6 C)  TempSrc: Oral  SpO2: 100%  Height: 5\' 2"  (1.575 m)   General: well-appearing, well groomed and dressed in bright colors Cardiac: regular rate and rhythm, no appreciable murmur, feet are cold to touch, right dorsalis pedis is difficult to palpate, left DP is strong Lungs: Clear to auscultation bilateral, no wheezes or rales MSK: hands with swan neck deformities in all digits  Neuro: CN II-XII grossly intact, decreased sensation to pinprick of her posterior right scalp,  strength in upper and lower extremities equal and intact bilateral   Assessment & Plan:   HTN  BP Readings from Last 3 Encounters:  05/29/17 112/66  03/16/17 (!) 151/83  02/28/17 138/87  Goal <130/80. Does report ongoing headache but denies blurred vision or orthostasis.  - increased diltiazem 180 mg qd (previously 120 mg), continue losartan 50 mg qd   Hypothyroidism s/p hashimoto's thyroiditis  Denies cold intollerance, constipation, weight gain or anxiety, heat intolerance, palpitations. Previously we were overtreating her hypothyroidism and she is due for follow up TSH.  -continue  levothyroxine 50 mcg qd  - follow-up TSH today  Healthcare maintenance -Receive flu vaccination today  Scalp numbness  Previously seen in the office for a right-sided headache which was thought to be related to leflunomide so this was discontinued. Unfortunately she's continued to have a headache which is now accompanied by numbness over her right scalp. On exam she has decreased sensation to pinprick over the right posterior scalp. She describes this decreased sensation as worsening and improving throughout the day but it is constantly present. She has no other neurologic deficits on exam. This would be very atypical presentation for CVA as it does not seem to affect the area of her scalp innervated by CN-V but she has had extensive cervical involvement of her rheumatoid arthritis demonstrated on xray in July and it could represent compression of cervical spinal nerves. Other differential for this neurologic change is a demyelinating disease. - ordered MRI brain and cervical spine  - RTC in 1 month   Nausea  Describes episodes of nausea followed by dizziness, diffuse weakness, and a feeling of cold in her legs. She's been having these episodes since April and they've increased in frequency, now happening about once every 2 hours and lasting minutes at a time. Episodes happen regardless of what she is doing, for example they can happen while she is lying down and reading or sitting watching tv. She had of these episodes in the exam room today when she leaned forward in a seated position, she had no nystagmus or change in blood pressure at the time and it resolved within seconds. He denies  chest pain, difficulty breathing, palpitations or syncope. Symptoms are unusual but may be related to BPPV. - prescribed trial of Zofran as needed for nausea - can consider referral to vestibular rehabilitation if she continues to have these episodes at follow-up, therapy may be difficult given that she is wheelchair  bound with contractures  See Encounters Tab for problem based charting.  Patient discussed with Dr. Oswaldo Done

## 2017-09-04 ENCOUNTER — Encounter (INDEPENDENT_AMBULATORY_CARE_PROVIDER_SITE_OTHER): Payer: Self-pay

## 2017-09-04 ENCOUNTER — Ambulatory Visit (INDEPENDENT_AMBULATORY_CARE_PROVIDER_SITE_OTHER): Payer: Medicare HMO | Admitting: Internal Medicine

## 2017-09-04 ENCOUNTER — Encounter: Payer: Self-pay | Admitting: Internal Medicine

## 2017-09-04 VITALS — BP 154/77 | HR 81 | Temp 97.8°F | Ht 62.0 in

## 2017-09-04 DIAGNOSIS — I712 Thoracic aortic aneurysm, without rupture: Secondary | ICD-10-CM | POA: Diagnosis not present

## 2017-09-04 DIAGNOSIS — Z993 Dependence on wheelchair: Secondary | ICD-10-CM | POA: Diagnosis not present

## 2017-09-04 DIAGNOSIS — Z23 Encounter for immunization: Secondary | ICD-10-CM

## 2017-09-04 DIAGNOSIS — R110 Nausea: Secondary | ICD-10-CM | POA: Insufficient documentation

## 2017-09-04 DIAGNOSIS — M1990 Unspecified osteoarthritis, unspecified site: Secondary | ICD-10-CM | POA: Diagnosis not present

## 2017-09-04 DIAGNOSIS — R200 Anesthesia of skin: Secondary | ICD-10-CM

## 2017-09-04 DIAGNOSIS — E039 Hypothyroidism, unspecified: Secondary | ICD-10-CM

## 2017-09-04 DIAGNOSIS — I10 Essential (primary) hypertension: Secondary | ICD-10-CM

## 2017-09-04 DIAGNOSIS — R202 Paresthesia of skin: Secondary | ICD-10-CM

## 2017-09-04 DIAGNOSIS — M810 Age-related osteoporosis without current pathological fracture: Secondary | ICD-10-CM | POA: Diagnosis not present

## 2017-09-04 DIAGNOSIS — Z79899 Other long term (current) drug therapy: Secondary | ICD-10-CM

## 2017-09-04 DIAGNOSIS — M069 Rheumatoid arthritis, unspecified: Secondary | ICD-10-CM | POA: Diagnosis not present

## 2017-09-04 DIAGNOSIS — R51 Headache: Secondary | ICD-10-CM | POA: Diagnosis not present

## 2017-09-04 DIAGNOSIS — E063 Autoimmune thyroiditis: Secondary | ICD-10-CM | POA: Diagnosis not present

## 2017-09-04 DIAGNOSIS — I4891 Unspecified atrial fibrillation: Secondary | ICD-10-CM

## 2017-09-04 MED ORDER — ONDANSETRON HCL 4 MG PO TABS: 4.0000 mg | ORAL_TABLET | Freq: Two times a day (BID) | ORAL | 0 refills | Status: DC

## 2017-09-04 MED ORDER — DILTIAZEM HCL ER COATED BEADS 180 MG PO CP24: 180.0000 mg | ORAL_CAPSULE | Freq: Every day | ORAL | 3 refills | Status: DC

## 2017-09-04 NOTE — Assessment & Plan Note (Signed)
Previously seen in the office for a right-sided headache which was thought to be related to leflunomide so this was discontinued. Unfortunately she's continued to have a headache which is now accompanied by numbness over her right scalp. On exam she has decreased sensation to pinprick over the right posterior scalp. She describes this decreased sensation as worsening and improving throughout the day but it is constantly present. She has no other neurologic deficits on exam. This would be very atypical presentation for CVA as it does not seem to affect the area of her scalp innervated by CN-V but she has had extensive cervical involvement of her rheumatoid arthritis demonstrated on xray in July and it could represent compression of cervical spinal nerves. Other differential for this neurologic change is a demyelinating disease. - ordered MRI brain and cervical spine  - RTC in 1 month

## 2017-09-04 NOTE — Assessment & Plan Note (Signed)
BP Readings from Last 3 Encounters:  05/29/17 112/66  03/16/17 (!) 151/83  02/28/17 138/87  Goal <130/80. Does report ongoing headache but denies blurred vision or orthostasis.  - increased diltiazem 180 mg qd (previously 120 mg), continue losartan 50 mg qd

## 2017-09-04 NOTE — Assessment & Plan Note (Signed)
Describes episodes of nausea followed by dizziness, diffuse weakness, and a feeling of cold in her legs. She's been having these episodes since April and they've increased in frequency, now happening about once every 2 hours and lasting minutes at a time. Episodes happen regardless of what she is doing, for example they can happen while she is lying down and reading or sitting watching tv. She had of these episodes in the exam room today when she leaned forward in a seated position, she had no nystagmus or change in blood pressure at the time and it resolved within seconds. He denies chest pain, difficulty breathing, palpitations or syncope. Symptoms are unusual but may be related to BPPV. - prescribed trial of Zofran as needed for nausea - can consider referral to vestibular rehabilitation if she continues to have these episodes at follow-up, therapy may be difficult given that she is wheelchair bound with contractures

## 2017-09-04 NOTE — Patient Instructions (Addendum)
It was a pleasure to see you today patient - For your blood pressure, start taking diltiazem 180 mg daily, continue taking losartan 50 mg daily - for the nausea, try taking Zofran as needed - For the numbness and tingling, I have ordered an MRI of your head and neck, someone from our clinic should call you to schedule this. Please call us if you have not heard from [place] within the next week or two - Please call our clinic if you have any problems or questions, we may be able to help you and keep you from a long emergency room wait. Our clinic and after hours phone number is [phone]    DASH Eating Plan DASH stands for "Dietary Approaches to Stop Hypertension." The DASH eating plan is a healthy eating plan that has been shown to reduce high blood pressure (hypertension). It may also reduce your risk for type 2 diabetes, heart disease, and stroke. The DASH eating plan may also help with weight loss. What are tips for following this plan? General guidelines  Avoid eating more than 2,300 mg (milligrams) of salt (sodium) a day. If you have hypertension, you may need to reduce your sodium intake to 1,500 mg a day.  Limit alcohol intake to no more than 1 drink a day for nonpregnant women and 2 drinks a day for men. One drink equals 12 oz of beer, 5 oz of wine, or 1 oz of hard liquor.  Work with your health care provider to maintain a healthy body weight or to lose weight. Ask what an ideal weight is for you.  Get at least 30 minutes of exercise that causes your heart to beat faster (aerobic exercise) most days of the week. Activities may include walking, swimming, or biking.  Work with your health care provider or diet and nutrition specialist (dietitian) to adjust your eating plan to your individual calorie needs. Reading food labels  Check food labels for the amount of sodium per serving. Choose foods with less than 5 percent of the Daily Value of sodium. Generally, foods with less than 300 mg  of sodium per serving fit into this eating plan.  To find whole grains, look for the word "whole" as the first word in the ingredient list. Shopping  Buy products labeled as "low-sodium" or "no salt added."  Buy fresh foods. Avoid canned foods and premade or frozen meals. Cooking  Avoid adding salt when cooking. Use salt-free seasonings or herbs instead of table salt or sea salt. Check with your health care provider or pharmacist before using salt substitutes.  Do not fry foods. Cook foods using healthy methods such as baking, boiling, grilling, and broiling instead.  Cook with heart-healthy oils, such as olive, canola, soybean, or sunflower oil. Meal planning   Eat a balanced diet that includes: ? 5 or more servings of fruits and vegetables each day. At each meal, try to fill half of your plate with fruits and vegetables. ? Up to 6-8 servings of whole grains each day. ? Less than 6 oz of lean meat, poultry, or fish each day. A 3-oz serving of meat is about the same size as a deck of cards. One egg equals 1 oz. ? 2 servings of low-fat dairy each day. ? A serving of nuts, seeds, or beans 5 times each week. ? Heart-healthy fats. Healthy fats called Omega-3 fatty acids are found in foods such as flaxseeds and coldwater fish, like sardines, salmon, and mackerel.  Limit how much you  eat of the following: ? Canned or prepackaged foods. ? Food that is high in trans fat, such as fried foods. ? Food that is high in saturated fat, such as fatty meat. ? Sweets, desserts, sugary drinks, and other foods with added sugar. ? Full-fat dairy products.  Do not salt foods before eating.  Try to eat at least 2 vegetarian meals each week.  Eat more home-cooked food and less restaurant, buffet, and fast food.  When eating at a restaurant, ask that your food be prepared with less salt or no salt, if possible. What foods are recommended? The items listed may not be a complete list. Talk with your  dietitian about what dietary choices are best for you. Grains Whole-grain or whole-wheat bread. Whole-grain or whole-wheat pasta. Brown rice. [place]. Bulgur. Whole-grain and low-sodium cereals. Pita bread. Low-fat, low-sodium crackers. Whole-wheat flour tortillas. Vegetables Fresh or frozen vegetables (raw, steamed, roasted, or grilled). Low-sodium or reduced-sodium tomato and vegetable juice. Low-sodium or reduced-sodium tomato sauce and tomato paste. Low-sodium or reduced-sodium canned vegetables. Fruits All fresh, dried, or frozen fruit. Canned fruit in natural juice (without added sugar). Meat and other protein foods Skinless chicken or [place]. Ground chicken or [place]. Pork with fat trimmed off. Fish and seafood. Egg whites. Dried beans, peas, or lentils. Unsalted nuts, nut butters, and seeds. Unsalted canned beans. Lean cuts of beef with fat trimmed off. Low-sodium, lean deli meat. Dairy Low-fat (1%) or fat-free (skim) milk. Fat-free, low-fat, or reduced-fat cheeses. Nonfat, low-sodium ricotta or cottage cheese. Low-fat or nonfat yogurt. Low-fat, low-sodium cheese. Fats and oils Soft margarine without trans fats. Vegetable oil. Low-fat, reduced-fat, or light mayonnaise and salad dressings (reduced-sodium). Canola, safflower, olive, soybean, and sunflower oils. Avocado. Seasoning and other foods Herbs. Spices. Seasoning mixes without salt. Unsalted popcorn and pretzels. Fat-free sweets. What foods are not recommended? The items listed may not be a complete list. Talk with your dietitian about what dietary choices are best for you. Grains Baked goods made with fat, such as croissants, muffins, or some breads. Dry pasta or rice meal packs. Vegetables Creamed or fried vegetables. Vegetables in a cheese sauce. Regular canned vegetables (not low-sodium or reduced-sodium). Regular canned tomato sauce and paste (not low-sodium or reduced-sodium). Regular tomato and vegetable juice (not  low-sodium or reduced-sodium). Angie Fava. Olives. Fruits Canned fruit in a light or heavy syrup. Fried fruit. Fruit in cream or butter sauce. Meat and other protein foods Fatty cuts of meat. Ribs. Fried meat. Berniece Salines. Sausage. Bologna and other processed lunch meats. Salami. Fatback. Hotdogs. Bratwurst. Salted nuts and seeds. Canned beans with added salt. Canned or smoked fish. Whole eggs or egg yolks. Chicken or [place] with skin. Dairy Whole or 2% milk, cream, and half-and-half. Whole or full-fat cream cheese. Whole-fat or sweetened yogurt. Full-fat cheese. Nondairy creamers. Whipped toppings. Processed cheese and cheese spreads. Fats and oils Butter. Stick margarine. Lard. Shortening. Ghee. Bacon fat. Tropical oils, such as coconut, palm kernel, or palm oil. Seasoning and other foods Salted popcorn and pretzels. Onion salt, garlic salt, seasoned salt, table salt, and sea salt. Worcestershire sauce. Tartar sauce. Barbecue sauce. Teriyaki sauce. Soy sauce, including reduced-sodium. Steak sauce. Canned and packaged gravies. Fish sauce. Oyster sauce. Cocktail sauce. Horseradish that you find on the shelf. Ketchup. Mustard. Meat flavorings and tenderizers. Bouillon cubes. Hot sauce and Tabasco sauce. Premade or packaged marinades. Premade or packaged taco seasonings. Relishes. Regular salad dressings. Where to find more information:  National Heart, Lung, and Blood Institute: https://wilson-eaton.com/  American  Heart Association: www.heart.org Summary  The DASH eating plan is a healthy eating plan that has been shown to reduce high blood pressure (hypertension). It may also reduce your risk for type 2 diabetes, heart disease, and stroke.  With the DASH eating plan, you should limit salt (sodium) intake to 2,300 mg a day. If you have hypertension, you may need to reduce your sodium intake to 1,500 mg a day.  When on the DASH eating plan, aim to eat more fresh fruits and vegetables, whole grains, lean proteins,  low-fat dairy, and heart-healthy fats.  Work with your health care provider or diet and nutrition specialist (dietitian) to adjust your eating plan to your individual calorie needs. This information is not intended to replace advice given to you by your health care provider. Make sure you discuss any questions you have with your health care provider. Document Released: 11/02/2011 Document Revised: 11/06/2016 Document Reviewed: 11/06/2016 Elsevier Interactive Patient Education  2017 [employer].

## 2017-09-04 NOTE — Assessment & Plan Note (Signed)
Denies cold intollerance, constipation, weight gain or anxiety, heat intolerance, palpitations. Previously we were overtreating her hypothyroidism and she is due for follow up TSH.  -continue levothyroxine 50 mcg qd  - follow-up TSH today

## 2017-09-05 LAB — BMP8+ANION GAP
Anion Gap: 17 mmol/L (ref 10.0–18.0)
BUN / CREAT RATIO: 34 — AB (ref 12–28)
BUN: 22 mg/dL (ref 8–27)
CALCIUM: 9.2 mg/dL (ref 8.7–10.3)
CHLORIDE: 103 mmol/L (ref 96–106)
CO2: 21 mmol/L (ref 20–29)
Creatinine, Ser: 0.65 mg/dL (ref 0.57–1.00)
GFR calc Af Amer: 95 mL/min/{1.73_m2} (ref >59)
GFR calc non Af Amer: 82 mL/min/{1.73_m2} (ref >59)
GLUCOSE: 105 mg/dL — AB (ref 65–99)
POTASSIUM: 4.1 mmol/L (ref 3.5–5.2)
Sodium: 141 mmol/L (ref 134–144)

## 2017-09-05 LAB — TSH: TSH: 4.93 u[IU]/mL — AB (ref 0.450–4.500)

## 2017-09-05 NOTE — Progress Notes (Signed)
Internal Medicine Clinic Attending  Case discussed with Dr. Blum at the time of the visit.  We reviewed the resident's history and exam and pertinent patient test results.  I agree with the assessment, diagnosis, and plan of care documented in the resident's note. 

## 2017-09-18 ENCOUNTER — Ambulatory Visit (HOSPITAL_COMMUNITY): Payer: Medicare HMO

## 2017-09-21 ENCOUNTER — Ambulatory Visit (HOSPITAL_COMMUNITY)
Admission: RE | Admit: 2017-09-21 | Discharge: 2017-09-21 | Disposition: A | Discharge status: Home / Self Care | Payer: Medicare HMO | Source: Ambulatory Visit | Attending: Internal Medicine | Admitting: Internal Medicine

## 2017-09-21 ENCOUNTER — Telehealth: Payer: Self-pay | Admitting: Internal Medicine

## 2017-09-21 DIAGNOSIS — R200 Anesthesia of skin: Secondary | ICD-10-CM | POA: Insufficient documentation

## 2017-09-21 DIAGNOSIS — R202 Paresthesia of skin: Secondary | ICD-10-CM | POA: Diagnosis not present

## 2017-09-21 DIAGNOSIS — M4802 Spinal stenosis, cervical region: Secondary | ICD-10-CM | POA: Diagnosis not present

## 2017-09-21 DIAGNOSIS — I6782 Cerebral ischemia: Secondary | ICD-10-CM | POA: Insufficient documentation

## 2017-09-21 DIAGNOSIS — I639 Cerebral infarction, unspecified: Secondary | ICD-10-CM | POA: Diagnosis not present

## 2017-09-21 NOTE — Telephone Encounter (Signed)
Was paged by radiology for the results of Ms. Ramo's MRI of brain and cervical spine done today. MRI imaging showed an acute stroke in the occipital lobe. Discussed results with patient's daughter Ms. Lilia Pro. I recommended patient come in tonight for admission for stroke workup. Patient's daughter stated that she would like to come in with her mother in the morning. She states that her mother feels fine, is eating and is complaining of right-sided scalp numbness that has been stable over the past few weeks. She denies that her mother is having any slurred speech, focal weakness or vision changes. Told daughter to bring patient in if she were to develop these symptoms into the ED.

## 2017-09-22 ENCOUNTER — Observation Stay (HOSPITAL_COMMUNITY): Payer: Medicare HMO

## 2017-09-22 ENCOUNTER — Observation Stay (HOSPITAL_COMMUNITY)
Admission: AD | Admit: 2017-09-22 | Discharge: 2017-09-23 | Disposition: A | Discharge status: Home / Self Care | Payer: Medicare HMO | Source: Ambulatory Visit | Attending: Student in an Organized Health Care Education/Training Program | Admitting: Student in an Organized Health Care Education/Training Program

## 2017-09-22 ENCOUNTER — Observation Stay (HOSPITAL_BASED_OUTPATIENT_CLINIC_OR_DEPARTMENT_OTHER): Payer: Medicare HMO

## 2017-09-22 DIAGNOSIS — Z79899 Other long term (current) drug therapy: Secondary | ICD-10-CM | POA: Diagnosis not present

## 2017-09-22 DIAGNOSIS — I10 Essential (primary) hypertension: Secondary | ICD-10-CM | POA: Diagnosis not present

## 2017-09-22 DIAGNOSIS — I6340 Cerebral infarction due to embolism of unspecified cerebral artery: Secondary | ICD-10-CM | POA: Diagnosis not present

## 2017-09-22 DIAGNOSIS — R200 Anesthesia of skin: Secondary | ICD-10-CM | POA: Diagnosis not present

## 2017-09-22 DIAGNOSIS — I639 Cerebral infarction, unspecified: Secondary | ICD-10-CM | POA: Diagnosis present

## 2017-09-22 DIAGNOSIS — M069 Rheumatoid arthritis, unspecified: Secondary | ICD-10-CM | POA: Diagnosis present

## 2017-09-22 DIAGNOSIS — E039 Hypothyroidism, unspecified: Secondary | ICD-10-CM | POA: Insufficient documentation

## 2017-09-22 DIAGNOSIS — I351 Nonrheumatic aortic (valve) insufficiency: Secondary | ICD-10-CM | POA: Diagnosis not present

## 2017-09-22 DIAGNOSIS — E063 Autoimmune thyroiditis: Secondary | ICD-10-CM | POA: Diagnosis present

## 2017-09-22 LAB — COMPREHENSIVE METABOLIC PANEL
ALBUMIN: 3.2 g/dL — AB (ref 3.5–5.0)
ALK PHOS: 84 U/L (ref 38–126)
ALT: 10 U/L — ABNORMAL LOW (ref 14–54)
ANION GAP: 6 (ref 5–15)
AST: 14 U/L — ABNORMAL LOW (ref 15–41)
BUN: 21 mg/dL — ABNORMAL HIGH (ref 6–20)
CHLORIDE: 107 mmol/L (ref 101–111)
CO2: 26 mmol/L (ref 22–32)
Calcium: 8.6 mg/dL — ABNORMAL LOW (ref 8.9–10.3)
Creatinine, Ser: 0.64 mg/dL (ref 0.44–1.00)
GFR calc non Af Amer: >60 mL/min (ref >60)
GLUCOSE: 82 mg/dL (ref 65–99)
POTASSIUM: 4.2 mmol/L (ref 3.5–5.1)
SODIUM: 139 mmol/L (ref 135–145)
Total Bilirubin: 0.6 mg/dL (ref 0.3–1.2)
Total Protein: 6.9 g/dL (ref 6.5–8.1)

## 2017-09-22 LAB — ECHOCARDIOGRAM COMPLETE
CHL CUP DOP CALC LVOT VTI: 16.4 cm
CHL CUP MV DEC (S): 292
E/e' ratio: 10.99
EWDT: 292 ms
FS: 25 % — AB (ref 28–44)
HEIGHTINCHES: 62 in
IV/PV OW: 1.65
LA diam end sys: 43 mm
LA vol: 28.1 mL
LADIAMINDEX: 2.85 cm/m2
LASIZE: 43 mm
LAVOLA4C: 20.5 mL
LAVOLIN: 18.6 mL/m2
LDCA: 3.14 cm2
LV E/e' medial: 10.99
LV E/e'average: 10.99
LV TDI E'MEDIAL: 4.9
LV e' LATERAL: 4.46 cm/s
LVOT SV: 51 mL
LVOT diameter: 20 mm
LVOT peak vel: 81.3 cm/s
Lateral S' vel: 6.96 cm/s
MVPKAVEL: 97.5 m/s
MVPKEVEL: 49 m/s
PW: 11.9 mm — AB (ref 0.6–1.1)
RV sys press: 22 mmHg
Reg peak vel: 217 cm/s
TDI e' lateral: 4.46
TR max vel: 217 cm/s
WEIGHTICAEL: 1840 [oz_av]

## 2017-09-22 LAB — CBC
HCT: 38.5 % (ref 36.0–46.0)
HEMOGLOBIN: 12.3 g/dL (ref 12.0–15.0)
MCH: 26.6 pg (ref 26.0–34.0)
MCHC: 31.9 g/dL (ref 30.0–36.0)
MCV: 83.3 fL (ref 78.0–100.0)
PLATELETS: 226 10*3/uL (ref 150–400)
RBC: 4.62 MIL/uL (ref 3.87–5.11)
RDW: 16.6 % — ABNORMAL HIGH (ref 11.5–15.5)
WBC: 9.3 10*3/uL (ref 4.0–10.5)

## 2017-09-22 LAB — PROTIME-INR
INR: 1.01
Prothrombin Time: 13.2 seconds (ref 11.4–15.2)

## 2017-09-22 LAB — APTT: APTT: 30 s (ref 24–36)

## 2017-09-22 MED ORDER — SENNOSIDES-DOCUSATE SODIUM 8.6-50 MG PO TABS: 1.0000 | ORAL_TABLET | Freq: Every evening | ORAL | Status: DC | PRN

## 2017-09-22 MED ORDER — LEVOCETIRIZINE DIHYDROCHLORIDE 5 MG PO TABS: 5.0000 mg | ORAL_TABLET | Freq: Every evening | ORAL | Status: DC

## 2017-09-22 MED ORDER — VITAMIN D 1000 UNITS PO TABS
1000.0000 [IU] | ORAL_TABLET | Freq: Every day | ORAL | Status: DC
  Administered 2017-09-23: 1000 [IU] via ORAL
  Filled 2017-09-22: qty 1

## 2017-09-22 MED ORDER — CALCIUM CARBONATE 1250 (500 CA) MG PO TABS
1.0000 | ORAL_TABLET | Freq: Every day | ORAL | Status: DC
  Administered 2017-09-23: 500 mg via ORAL
  Filled 2017-09-22 (×2): qty 1

## 2017-09-22 MED ORDER — ACETAMINOPHEN 650 MG RE SUPP: 650.0000 mg | RECTAL | Status: DC | PRN

## 2017-09-22 MED ORDER — ENSURE ENLIVE PO LIQD
237.0000 mL | Freq: Two times a day (BID) | ORAL | Status: DC
  Administered 2017-09-22 – 2017-09-23 (×2): 237 mL via ORAL

## 2017-09-22 MED ORDER — ACETAMINOPHEN 160 MG/5ML PO SOLN: 650.0000 mg | ORAL | Status: DC | PRN

## 2017-09-22 MED ORDER — LORATADINE 10 MG PO TABS: 10.0000 mg | ORAL_TABLET | Freq: Every day | ORAL | Status: DC

## 2017-09-22 MED ORDER — ASPIRIN 300 MG RE SUPP
300.0000 mg | Freq: Every day | RECTAL | Status: DC
  Filled 2017-09-22: qty 1

## 2017-09-22 MED ORDER — ASPIRIN 325 MG PO TABS
325.0000 mg | ORAL_TABLET | Freq: Every day | ORAL | Status: DC
  Administered 2017-09-22 – 2017-09-23 (×2): 325 mg via ORAL
  Filled 2017-09-22 (×2): qty 1

## 2017-09-22 MED ORDER — STROKE: EARLY STAGES OF RECOVERY BOOK
Freq: Once | Status: AC
  Administered 2017-09-22: 22:00:00
  Filled 2017-09-22: qty 1

## 2017-09-22 MED ORDER — LEVOTHYROXINE SODIUM 75 MCG PO TABS
75.0000 ug | ORAL_TABLET | Freq: Every day | ORAL | Status: DC
  Administered 2017-09-23: 75 ug via ORAL
  Filled 2017-09-22: qty 1

## 2017-09-22 MED ORDER — ACETAMINOPHEN 325 MG PO TABS: 650.0000 mg | ORAL_TABLET | ORAL | Status: DC | PRN

## 2017-09-22 MED ORDER — FOLIC ACID 1 MG PO TABS
1.0000 mg | ORAL_TABLET | Freq: Every day | ORAL | Status: DC
  Administered 2017-09-23: 1 mg via ORAL
  Filled 2017-09-22: qty 1

## 2017-09-22 MED ORDER — ENOXAPARIN SODIUM 40 MG/0.4ML ~~LOC~~ SOLN
40.0000 mg | SUBCUTANEOUS | Status: DC
  Administered 2017-09-22: 40 mg via SUBCUTANEOUS
  Filled 2017-09-22: qty 0.4

## 2017-09-22 NOTE — H&P (Signed)
Date: 09/23/2017               Patient Name:  Whitney Mayer MRN: [id number]  DOB: 1932-09-07 Age / Sex: 83 y.o., female   PCP: Eulah Pont, MD         Medical Service: Internal Medicine Teaching Service         Attending Physician: Dr. Oswaldo Done, Marquita Palms, *    First Contact: Dr. Minda Meo Pager: [phone]  Second Contact: Dr. Samuella Cota Pager: [phone]       After Hours (After 5p/  First Contact Pager: [phone]  weekends / holidays): Second Contact Pager: [phone]   Chief Complaint: MRI with left occipital lobe infarct  History of Present Illness:  83 yo female with PMHx HTN, rheumatoid arthritis, osteoporosis, osteoarthritis, hashimoto's thyroiditis, and ascending thoracic aortic aneurysm presenting for direct admission after MRI of the brain showed left occipital acute/subacute infarct. The patient was seen in clinic earlier this month, and was experiencing HA and right posterior scalp numbness, with no other focal neurological deficits noted.   The patient denies focal weakness, slurred speech, or changes in vision, or visual defects. She endorses right sided scalp numbness with associated tingling/pins and needle sensation. These sensations have been constant with intermittent worsening of the pins and needs for the past 3 months. She denies headache or dizziness. She endorses difficulty with mobility due to her rheumatoid arthritis, especially in her knees. She also has been experiencing intermittent episodes of generalized weakness, nausea, and sensation tomove her bowel. These episodes are not associated with standing from a seating position. They can occur at anytime, even when the patient is lying still. The patient will rest for several minutes and the episode will past. She denies chest pain, palpitations, or shortness of breath. Her only complaint is her knee pain and limited mobility.   The patient is accompanied by her four daughters who also deny any acute neurological  changes.  Meds:  Current Meds  Medication Sig  . alendronate (FOSAMAX) 70 MG tablet Take 1 tablet (70 mg total) by mouth once a week. Take with a full glass of water on an empty stomach. (Patient taking differently: Take 70 mg by mouth every Friday. Take with a full glass of water on an empty stomach.)  . calcium carbonate (OS-CAL - DOSED IN MG OF ELEMENTAL CALCIUM) 1250 (500 Ca) MG tablet Take 1 tablet (500 mg of elemental calcium total) by mouth daily with breakfast.  . Cholecalciferol 1000 units tablet Take 1 tablet (1,000 Units total) by mouth daily.  Marland Kitchen diltiazem (CARDIZEM CD) 180 MG 24 hr capsule Take 1 capsule (180 mg total) by mouth daily.  . feeding supplement, ENSURE ENLIVE, (ENSURE ENLIVE) LIQD Take 237 mLs by mouth 2 (two) times daily between meals. (Patient taking differently: Take 237 mLs by mouth 3 (three) times a week. )  . folic acid (FOLVITE) 1 MG tablet Take 1 tablet (1 mg total) by mouth daily.  . hydrOXYzine (ATARAX/VISTARIL) 25 MG tablet Take 25 mg by mouth 3 (three) times daily. FOR ITCHING  . ibuprofen (ADVIL,MOTRIN) 200 MG tablet Take 400 mg by mouth 2 (two) times daily as needed (for pain).  Marland Kitchen levocetirizine (XYZAL) 5 MG tablet Take 1 tablet (5 mg total) by mouth every evening.  Marland Kitchen levothyroxine (SYNTHROID, LEVOTHROID) 100 MCG tablet Take 0.5 tablets (50 mcg total) by mouth daily before breakfast.  . losartan (COZAAR) 100 MG tablet Take 0.5 tablets (50 mg total) by mouth daily.  Marland Kitchen  montelukast (SINGULAIR) 10 MG tablet Take 10 mg by mouth daily.  . ondansetron (ZOFRAN) 4 MG tablet Take 1 tablet (4 mg total) by mouth 2 (two) times daily. (Patient taking differently: Take 4 mg by mouth 2 (two) times daily as needed for nausea or vomiting. )  . ranitidine (ZANTAC) 150 MG tablet Take 150 mg by mouth every morning.  . traMADol (ULTRAM) 50 MG tablet Take 1 tablet (50 mg total) by mouth every 6 (six) hours as needed. (Patient taking differently: Take 50 mg by mouth every 6 (six)  hours as needed (for pain). )     Allergies: Allergies as of 09/21/2017  . (No Known Allergies)   Past Medical History:  Diagnosis Date  . Hypertension   . Hypothyroidism   . Pneumonia ~ 1943  . Rheumatoid arthritis (HCC)    "all over; hands, feet, knees, joints" (05/31/2016)  . Thyroid disease     Family History:  Family History  Problem Relation Age of Onset  . Stroke Mother   . Kidney disease Brother     Social History:  Social History  Substance Use Topics  . Smoking status: Never Smoker  . Smokeless tobacco: Never Used  . Alcohol use No    Review of Systems: A complete ROS was negative except as per HPI.   Physical Exam: Blood pressure 128/68, pulse (!) 58, temperature (!) 97.5 F (36.4 C), temperature source Oral, resp. rate 20, height 5\' 2"  (1.575 m), weight 115 lb (52.2 kg), SpO2 100 %.  General: Laying in bed comfortably, NAD HEENT: [place]/AT, EOMI, no scleral icterus, PERRL Cardiac: RRR, No R/M/G appreciated Pulm: normal effort, CTAB Abd: soft, non tender, non distended, BS normal Ext: extremities well perfused, no peripheral edema; ulnar deviation and swan neck deformities of both right and left hands; no joint welling or erythema noted  Neuro: alert and oriented X3, cranial nerves II-XII grossly intact; no focal neurological deficits; strength 3/5 lower extremities bilaterally secondary to joint pain; 5/5 upper strength bilaterally; decreased sensation of right lateral scalp Skin: no rash appreciated on scalp   EKG: 12 lead EKG pending; Tele- normal sinus rhythm  CXR: none to review  Assessment & Plan by Problem: Active Problems:   CVA (cerebral vascular accident) (HCC) Left occipital lobe infarct MRI with 3 mm focus of restricted diffusion in the left occipital lobe consistent with acute or subacute infarct. Patient largely asymptomatic without focal neurological deficits, peripheral vision intact, no visual field deficits. Will proceed with stroke  work up. Passed bedside SLP. ECHO with mild LVH, EF 60-65%, normal systolic function, G1DD, mild LA dilation, atrial septal aneurysm. Neck MRA with no stenosis. Intracranial MRA with no acute finding or flow limiting stenosis, 2 small left ICA aneurysms at the level of the anterior genu, no progression or new site of infarction. -A1c, lipid panel  -Aspirin 325 mg daily  -PT/OT  HTN  BP elevated 157/84 -Holding diltiazem 180 mg daily and losartan 50 mg daily  Hx atrial fibrillation During heart cath 05/2016 she went into atrial fibrillation, blood pressure dropped and prior to cardioversion, she reverted back to sinus rhythm. She was discharged from the hospital on Eliquis. She was able to d/c eliquis 07/2016 after 48 holter monitor revealed no AF.  -Cardiac monitoring  Rheumatoid arthritis Positive RF, positive anti-CCP, contractures and erosions. Patient with chronic pain in multiple sites. Not currently on immunosuppresant therapy due to adverse reactions with MTX(bronchiectasis) and Arava (headaches).  -PT/OT  Hashimoto's thyroiditis  Most  recent TSH 4.930.  -Continue levothyroxine 75 mcg qd    Dispo: Admit patient to Observation with expected length of stay less than 2 midnights.  Signed: Toney Rakes, MD 09/23/2017, 9:56 AM  Pager: [phone]

## 2017-09-22 NOTE — Progress Notes (Signed)
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 MD also made aware.

## 2017-09-22 NOTE — Progress Notes (Signed)
  Echocardiogram 2D Echocardiogram has been performed.  Leta Jungling M 09/22/2017, 2:03 PM

## 2017-09-23 ENCOUNTER — Encounter (HOSPITAL_COMMUNITY): Payer: Self-pay | Admitting: *Deleted

## 2017-09-23 DIAGNOSIS — E063 Autoimmune thyroiditis: Secondary | ICD-10-CM

## 2017-09-23 DIAGNOSIS — I639 Cerebral infarction, unspecified: Secondary | ICD-10-CM

## 2017-09-23 DIAGNOSIS — Z79899 Other long term (current) drug therapy: Secondary | ICD-10-CM

## 2017-09-23 DIAGNOSIS — E039 Hypothyroidism, unspecified: Secondary | ICD-10-CM | POA: Diagnosis not present

## 2017-09-23 DIAGNOSIS — R200 Anesthesia of skin: Secondary | ICD-10-CM | POA: Diagnosis not present

## 2017-09-23 DIAGNOSIS — I10 Essential (primary) hypertension: Secondary | ICD-10-CM | POA: Diagnosis not present

## 2017-09-23 DIAGNOSIS — I6389 Other cerebral infarction: Secondary | ICD-10-CM | POA: Diagnosis not present

## 2017-09-23 DIAGNOSIS — Z79890 Hormone replacement therapy (postmenopausal): Secondary | ICD-10-CM

## 2017-09-23 DIAGNOSIS — I6340 Cerebral infarction due to embolism of unspecified cerebral artery: Secondary | ICD-10-CM | POA: Diagnosis not present

## 2017-09-23 LAB — LIPID PANEL
CHOL/HDL RATIO: 3.9 ratio
Cholesterol: 205 mg/dL — ABNORMAL HIGH (ref 0–200)
HDL: 52 mg/dL (ref >40)
LDL CALC: 139 mg/dL — AB (ref 0–99)
Triglycerides: 71 mg/dL (ref <150)
VLDL: 14 mg/dL (ref 0–40)

## 2017-09-23 LAB — HEMOGLOBIN A1C
Hgb A1c MFr Bld: 5.5 % (ref 4.8–5.6)
MEAN PLASMA GLUCOSE: 111.15 mg/dL

## 2017-09-23 MED ORDER — ASPIRIN 300 MG RE SUPP: 300.0000 mg | Freq: Two times a day (BID) | RECTAL | Status: DC

## 2017-09-23 MED ORDER — ATORVASTATIN CALCIUM 40 MG PO TABS
40.0000 mg | ORAL_TABLET | Freq: Every day | ORAL | Status: DC
Start: 2017-09-23 — End: 2017-09-23

## 2017-09-23 MED ORDER — ASPIRIN 325 MG PO TABS: 325.0000 mg | ORAL_TABLET | Freq: Two times a day (BID) | ORAL | Status: DC

## 2017-09-23 MED ORDER — ATORVASTATIN CALCIUM 80 MG PO TABS: 80.0000 mg | ORAL_TABLET | Freq: Every day | ORAL | Status: DC

## 2017-09-23 MED ORDER — ATORVASTATIN CALCIUM 80 MG PO TABS: 80.0000 mg | ORAL_TABLET | Freq: Every day | ORAL | 0 refills | Status: DC

## 2017-09-23 MED ORDER — ASPIRIN 325 MG PO TABS: 325.0000 mg | ORAL_TABLET | Freq: Two times a day (BID) | ORAL | 0 refills | Status: DC

## 2017-09-23 MED ORDER — LEVOTHYROXINE SODIUM 75 MCG PO TABS: 75.0000 ug | ORAL_TABLET | Freq: Every day | ORAL | 0 refills | Status: DC

## 2017-09-23 NOTE — Evaluation (Signed)
Occupational Therapy Evaluation and Discharge  Patient Details Name: Whitney Mayer MRN: [id number] DOB: Oct 04, 1932 Today's Date: 09/23/2017    History of Present Illness 83 yo female with PMHx HTN, rheumatoid arthritis, osteoporosis, osteoarthritis, hashimoto's thyroiditis, and ascending thoracic aortic aneurysm presenting for direct admission after MRI of the brain showed left occipital acute/subacute infarct.   Clinical Impression   Pt reports she required assist with ADL and transfers PTA. Currently pt able to perform lateral scoot transfer with supervision and requires min assist for LB ADL. Pt reports she currently feels at her functional baseline without residual deficits. Pt planning to d/c home with 24/7 supervision from family. No further acute OT needs identified; signing off at this time. Please re-consult if needs change. Thank you for this referral.    Follow Up Recommendations  No OT follow up;Supervision/Assistance - 24 hour    Equipment Recommendations  None recommended by OT    Recommendations for Other Services PT consult     Precautions / Restrictions Precautions Precautions: Fall Restrictions Weight Bearing Restrictions: No      Mobility Bed Mobility Overal bed mobility: Modified Independent             General bed mobility comments: HOB elevated with increased time but pt able to perform without assist  Transfers Overall transfer level: Needs assistance   Transfers: Lateral/Scoot Transfers          Lateral/Scoot Transfers: Supervision General transfer comment: Supervision for safety but pt able to perform lateral scoot transfer to L side without physical assist    Balance Overall balance assessment: Needs assistance Sitting-balance support: No upper extremity supported;Feet unsupported Sitting balance-Leahy Scale: Good                                     ADL either performed or assessed with clinical judgement   ADL  Overall ADL's : Needs assistance/impaired Eating/Feeding: Set up;Sitting   Grooming: Set up;Supervision/safety;Sitting   Upper Body Bathing: Set up;Supervision/ safety;Sitting   Lower Body Bathing: Minimal assistance;Sitting/lateral leans   Upper Body Dressing : Set up;Supervision/safety;Sitting   Lower Body Dressing: Minimal assistance;Sitting/lateral leans   Toilet Transfer: Supervision/safety;BSC (lateral scoot transfer) Toilet Transfer Details (indicate cue type and reason): simulated by lateral scoot transfer EOB>drop arm BSC         Functional mobility during ADLs: Supervision/safety (for lateral scoot transfers) General ADL Comments: Pt reports she feels at her functional baseline currently.      Vision Baseline Vision/History: Wears glasses Wears Glasses: Reading only Patient Visual Report: No change from baseline Vision Assessment?: No apparent visual deficits     Perception     Praxis      Pertinent Vitals/Pain Pain Assessment: No/denies pain     Hand Dominance Right   Extremity/Trunk Assessment Upper Extremity Assessment Upper Extremity Assessment: Overall WFL for tasks assessed (bil digit deformities due to RA )   Lower Extremity Assessment Lower Extremity Assessment: Defer to PT evaluation   Cervical / Trunk Assessment Cervical / Trunk Assessment: Kyphotic   Communication Communication Communication: No difficulties   Cognition Arousal/Alertness: Awake/alert Behavior During Therapy: WFL for tasks assessed/performed Overall Cognitive Status: Within Functional Limits for tasks assessed                                     General Comments  Exercises     Shoulder Instructions      Home Living Family/patient expects to be discharged to:: Private residence Living Arrangements: Children Available Help at Discharge: Family;Available 24 hours/day Type of Home: House Home Access: Ramped entrance     Home Layout: Two  level Alternate Level Stairs-Number of Steps: ramp between levels   Bathroom Shower/Tub: Tub/shower unit;Curtain   Bathroom Toilet: Standard     Home Equipment: Environmental consultant - 2 wheels;Bedside commode;Tub bench;Wheelchair - manual;Hospital bed      Lives With: Family    Prior Functioning/Environment Level of Independence: Needs assistance  Gait / Transfers Assistance Needed: transfers to w/c via lateral scoot-someone always present when pt trasnfers but she is able to do most of it on her own ADL's / Homemaking Assistance Needed: tub bench for tub transfers; pt able to complete bathing and dressing with minimal assist from her daughters. Lateral scoot to drop arm BSC            OT Problem List:        OT Treatment/Interventions:      OT Goals(Current goals can be found in the care plan section) Acute Rehab OT Goals Patient Stated Goal: return home OT Goal Formulation: All assessment and education complete, DC therapy  OT Frequency:     Barriers to D/C:            Co-evaluation              AM-PAC PT "6 Clicks" Daily Activity     Outcome Measure Help from another person eating meals?: None Help from another person taking care of personal grooming?: A Little Help from another person toileting, which includes using toliet, bedpan, or urinal?: A Little Help from another person bathing (including washing, rinsing, drying)?: A Little Help from another person to put on and taking off regular upper body clothing?: None Help from another person to put on and taking off regular lower body clothing?: A Little 6 Click Score: 20   End of Session Nurse Communication: Mobility status Manufacturing engineer)  Activity Tolerance: Patient tolerated treatment well Patient left: in chair;with call bell/phone within reach;with family/visitor present  OT Visit Diagnosis: Other abnormalities of gait and mobility (R26.89);Unsteadiness on feet (R26.81)                Time: 8250-0370 OT Time  Calculation (min): 12 min Charges:  OT General Charges $OT Visit: 1 Visit OT Evaluation $OT Eval Moderate Complexity: 1 Mod G-Codes: OT G-codes **NOT FOR INPATIENT CLASS** Functional Assessment Tool Used: AM-PAC 6 Clicks Daily Activity Functional Limitation: Self care Self Care Current Status (W8889): At least 20 percent but less than 40 percent impaired, limited or restricted Self Care Goal Status (V6945): At least 20 percent but less than 40 percent impaired, limited or restricted Self Care Discharge Status [phone]): At least 20 percent but less than 40 percent impaired, limited or restricted   Fredric Mare A. Brett Albino, M.S., OTR/L Pager: [phone]  Gaye Alken 09/23/2017, 10:07 AM

## 2017-09-23 NOTE — Evaluation (Signed)
Speech Language Pathology Evaluation Patient Details Name: Whitney Mayer MRN: [id number] DOB: 1932-11-09 Today's Date: 09/23/2017 Time: 1601-0932 SLP Time Calculation (min) (ACUTE ONLY): 15 min  Problem List:  Patient Active Problem List   Diagnosis Date Noted  . CVA (cerebral vascular accident) (HCC) 09/22/2017  . Nausea 09/04/2017  . Numbness of face 03/19/2017  . Aortic atherosclerosis (HCC) 02/15/2017  . Primary osteoarthritis of both knees 11/29/2016  . Primary osteoarthritis of both hips 11/29/2016  . Bronchiectasis without acute exacerbation (HCC) 11/29/2016  . Pruritus 10/29/2016  . No blood products 09/11/2016  . Hypertension 06/08/2016  . Osteoporosis 06/08/2016  . Hashimoto's thyroiditis 06/01/2016  . Thoracic ascending aortic aneurysm (HCC) 06/01/2016  . Rheumatoid arthritis involving multiple sites (HCC) 06/01/2016  . Coronary artery calcification 06/01/2016   Past Medical History:  Past Medical History:  Diagnosis Date  . Hypertension   . Hypothyroidism   . Pneumonia ~ 1943  . Rheumatoid arthritis (HCC)    "all over; hands, feet, knees, joints" (05/31/2016)  . Thyroid disease    Past Surgical History:  Past Surgical History:  Procedure Laterality Date  . CARDIAC CATHETERIZATION N/A 06/02/2016   Procedure: Left Heart Cath and Coronary Angiography;  Surgeon: Lyn Records, MD;  Location: [facility] INVASIVE CV LAB;  Service: Cardiovascular;  Laterality: N/A;  . CARDIOVERSION N/A 06/02/2016   Procedure: CARDIOVERSION;  Surgeon: Runell Gess, MD;  Location: [facility] OR;  Service: Cardiovascular;  Laterality: N/A;  . DILATION AND CURETTAGE OF UTERUS  1980s  . TUBAL LIGATION  1960s  . VIDEO BRONCHOSCOPY Bilateral 12/07/2016   Procedure: VIDEO BRONCHOSCOPY WITHOUT FLUORO;  Surgeon: Kalman Shan, MD;  Location: [facility] ENDOSCOPY;  Service: Cardiopulmonary;  Laterality: Bilateral;   HPI:  83 yo female with PMHx HTN, rheumatoid arthritis, osteoporosis, osteoarthritis, hashimoto's  thyroiditis, and ascending thoracic aortic aneurysm presenting for direct admission after MRI of the brain showed left occipital acute/subacute infarct. MRI with 3 mm focus of restricted diffusion in the left occipital lobe consistent with acute or subacute infarct.    Assessment / Plan / Recommendation Clinical Impression  Patient presents with impaired short term and working memory, decreased functional recall. Pt is aware of deficits; she and her daughter report decline in memory over the past 6 months. She appears to be functioning at her baseline. Lives with her daughters who provide assistance with medications and daily routines. Oral motor examination unremarkable; speech is clear and 100% intelligible. Provided education to pt and daughter re: compensations for memory including using a calendar, planner daily and cognitive activities for home. At this time no further skilled ST recommended. SLP will s/o.     SLP Assessment  SLP Recommendation/Assessment: Patient does not need any further Speech Lanaguage Pathology Services SLP Visit Diagnosis: Cognitive communication deficit (R41.841)    Follow Up Recommendations  24 hour supervision/assistance    Frequency and Duration           SLP Evaluation Cognition  Overall Cognitive Status: History of cognitive impairments - at baseline Arousal/Alertness: Awake/alert Orientation Level: Oriented X4 Attention: Focused;Sustained;Selective Focused Attention: Appears intact Sustained Attention: Appears intact Selective Attention: Appears intact Memory: Impaired Memory Impairment: Storage deficit;Decreased short term memory;Decreased recall of new information Decreased Short Term Memory: Verbal complex;Functional complex Awareness: Appears intact Problem Solving: Appears intact Safety/Judgment: Appears intact       Comprehension  Auditory Comprehension Overall Auditory Comprehension: Appears within functional limits for tasks  assessed Visual Recognition/Discrimination Discrimination: Within Function Limits Reading Comprehension Reading Status: Within funtional limits  Expression Expression Primary Mode of Expression: Verbal Verbal Expression Overall Verbal Expression: Appears within functional limits for tasks assessed Initiation: No impairment Automatic Speech: Name;Social Response Level of Generative/Spontaneous Verbalization: Conversation Repetition: No impairment Naming: Not tested Pragmatics: No impairment Written Expression Dominant Hand: Right Written Expression: Not tested   Oral / Motor  Oral Motor/Sensory Function Overall Oral Motor/Sensory Function: Within functional limits Motor Speech Overall Motor Speech: Appears within functional limits for tasks assessed Respiration: Within functional limits Phonation: Normal Resonance: Within functional limits Articulation: Within functional limitis Intelligibility: Intelligible Motor Planning: Witnin functional limits Motor Speech Errors: Not applicable   GO          Functional Assessment Tool Used: skilled clinical judgment Functional Limitations: Memory Memory Current Status (O0355): At least 20 percent but less than 40 percent impaired, limited or restricted Memory Goal Status (H7416): At least 20 percent but less than 40 percent impaired, limited or restricted Memory Discharge Status [phone]): At least 20 percent but less than 40 percent impaired, limited or restricted         Arlana Lindau 09/23/2017, 8:14 AM  Rondel Baton, MS, CCC-SLP Speech-Language Pathologist [phone]

## 2017-09-23 NOTE — Progress Notes (Signed)
This RN went over discharge with Pt. and family. Pt. and Family verbalized understanding of discharge. All questions and concern were addressed. Discharged home and taken down in a wheelchair.

## 2017-09-23 NOTE — Evaluation (Signed)
Physical Therapy Evaluation and Discharge  Patient Details Name: Whitney Mayer MRN: [id number] DOB: 1932/07/05 Today's Date: 09/23/2017   History of Present Illness  83 yo female with PMHx HTN, rheumatoid arthritis, osteoporosis, osteoarthritis, hashimoto's thyroiditis, and ascending thoracic aortic aneurysm presenting for direct admission after MRI of the brain showed left occipital acute/subacute infarct.    Clinical Impression  Patient evaluated by Physical Therapy with no further acute PT needs identified. All education has been completed and the patient has no further questions. Patient receiving excellent care from family members at home. No further needs identified. PT is signing off. Thank you for this referral.     Follow Up Recommendations No PT follow up    Equipment Recommendations  None recommended by PT    Recommendations for Other Services       Precautions / Restrictions Precautions Precautions: Fall Restrictions Weight Bearing Restrictions: No      Mobility  Bed Mobility Overal bed mobility: Modified Independent             General bed mobility comments: HOB elevated with increased time but pt able to perform without assist  Transfers Overall transfer level: Needs assistance   Transfers: Lateral/Scoot Transfers          Lateral/Scoot Transfers: Supervision General transfer comment: Supervision for safety but pt able to perform lateral scoot transfer to L side from recliner up to bed (~4 inch height difference) without physical assist  Ambulation/Gait             General Gait Details: non-ambulatory for ~2 yrs  Stairs            Wheelchair Mobility    Modified Rankin (Stroke Patients Only)       Balance Overall balance assessment: Needs assistance Sitting-balance support: No upper extremity supported;Feet unsupported Sitting balance-Leahy Scale: Good                                       Pertinent  Vitals/Pain Pain Assessment: No/denies pain    Home Living Family/patient expects to be discharged to:: Private residence Living Arrangements: Children Available Help at Discharge: Family;Available 24 hours/day Type of Home: House Home Access: Ramped entrance     Home Layout: Two level Home Equipment: Walker - 2 wheels;Bedside commode;Tub bench;Wheelchair - manual;Hospital bed      Prior Function Level of Independence: Needs assistance   Gait / Transfers Assistance Needed: transfers to w/c via lateral scoot-someone always present when pt trasnfers but she is able to do most of it on her own  ADL's / Homemaking Assistance Needed: tub bench for tub transfers; pt able to complete bathing and dressing with minimal assist from her daughters. Lateral scoot to drop arm BSC        Hand Dominance   Dominant Hand: Right    Extremity/Trunk Assessment   Upper Extremity Assessment Upper Extremity Assessment: Defer to OT evaluation (RA changes to hands)    Lower Extremity Assessment Lower Extremity Assessment: RLE deficits/detail;LLE deficits/detail RLE Deficits / Details: ankle ROM WFL; hips and knees with flexion contractures (~45 degrees); strength grossly 3+ throughout LLE Deficits / Details: ankle ROM WFL; hips and knees with flexion contractures (~45 degrees); strength grossly 3+ throughout    Cervical / Trunk Assessment Cervical / Trunk Assessment: Kyphotic  Communication   Communication: No difficulties  Cognition Arousal/Alertness: Awake/alert Behavior During Therapy: WFL for tasks assessed/performed Overall Cognitive Status:  Within Functional Limits for tasks assessed                                        General Comments General comments (skin integrity, edema, etc.): Daughter present. Patient demonstrated her exercise routine she does at home twice per day (ankle pumps, knee flexon/extension, removing pillow from behind her knees and stretching knees  into extension--with heels on the bed and at risk for pressure sores). Educated how to "float heels" if she wants to work on knee extension. Discussed unlikely that she can reverse her contractures to be able to stand and walk, however can prevent them from worsening. Daughter reported they often do not use the pt's w/c cushion as it makes it harder for her to scoot in/out of w/c. Unfortunately, insurance will not pay for another cushion for 5 years and they are not interested in paying out of pocket. Daughter states if pt is going to sit up for a long period, they make sure to use cushion. They deny any problems with pressure ulcers.     Exercises     Assessment/Plan    PT Assessment Patent does not need any further PT services  PT Problem List         PT Treatment Interventions      PT Goals (Current goals can be found in the Care Plan section)  Acute Rehab PT Goals Patient Stated Goal: return home PT Goal Formulation: All assessment and education complete, DC therapy    Frequency     Barriers to discharge        Co-evaluation               AM-PAC PT "6 Clicks" Daily Activity  Outcome Measure Difficulty turning over in bed (including adjusting bedclothes, sheets and blankets)?: A Little Difficulty moving from lying on back to sitting on the side of the bed? : A Little Difficulty sitting down on and standing up from a chair with arms (e.g., wheelchair, bedside commode, etc,.)?: Unable Help needed moving to and from a bed to chair (including a wheelchair)?: None Help needed walking in hospital room?: Total Help needed climbing 3-5 steps with a railing? : Total 6 Click Score: 13    End of Session   Activity Tolerance: Patient tolerated treatment well Patient left: in bed;with call bell/phone within reach;with family/visitor present;with nursing/sitter in room Nurse Communication: Other (comment) (no further PT needs) PT Visit Diagnosis: Muscle weakness (generalized)  (M62.81)    Time: 1343-1401 PT Time Calculation (min) (ACUTE ONLY): 18 min   Charges:   PT Evaluation $PT Eval Low Complexity: 1 Low     PT G Codes:   PT G-Codes **NOT FOR INPATIENT CLASS** Functional Assessment Tool Used: AM-PAC 6 Clicks Basic Mobility Functional Limitation: Mobility: Walking and moving around Mobility: Walking and Moving Around Current Status (G8185): At least 40 percent but less than 60 percent impaired, limited or restricted Mobility: Walking and Moving Around Goal Status [phone]): At least 40 percent but less than 60 percent impaired, limited or restricted Mobility: Walking and Moving Around Discharge Status [phone]): At least 40 percent but less than 60 percent impaired, limited or restricted      [employer], PT 09/23/2017, 2:14 PM

## 2017-09-23 NOTE — Progress Notes (Signed)
   Subjective:  Patient seen and examined. She has no acute complaints this AM and would like to be discharged home.   Objective:  Vital signs in last 24 hours: Vitals:   09/22/17 2200 09/23/17 0000 09/23/17 0134 09/23/17 0503  BP: (!) 149/64 119/62 136/67 128/68  Pulse: 61 (!) 56 62 (!) 58  Resp:   20 20  Temp: 98.1 F (36.7 C) 97.9 F (36.6 C) 97.8 F (36.6 C) (!) 97.5 F (36.4 C)  TempSrc: Oral Oral Oral Oral  SpO2: 97% 98% 98% 100%  Weight:      Height:       General: Laying in bed comfortably, NAD HEENT: [place]/AT, EOMI, no scleral icterus Cardiac: RRR, No R/M/G appreciated Pulm: normal effort, CTAB Abd: soft, non tender, non distended, BS normal Ext: extremities well perfused, no peripheral edema; ulnar deviation and swan neck deformities of both right and left hands; no joint swelling or erythema noted  Neuro: alert and oriented X3, cranial nerves II-XII grossly intact; no focal neurological deficits    Assessment/Plan:  Active Problems:   CVA (cerebral vascular accident) (HCC)  Left occipital lobe infarct MRI with 3 mm focus of restricted diffusion in the left occipital lobe consistent with acute or subacute infarct, most likely an incidental finding as patient does not have symptoms consistent with occipital infarction. Right sided scalp paresthesias are most likely due to C3-C4 foraminal narrowing 2/2 tobone spurring. Infarct is likely thrombotic in origin as patient has elevated LDL, hx RA, and large and small vessel aneurysms consistent with vasculopathy. She does have remote history of a fib after left heart cath, but infarct does not seem cardioembolic in nature.  -Will contact neuro for further recs -Aspirin 325 mg daily  -PT/OT  HTN  Normotensive 128/68.  -Holding diltiazem 180 mg daily and losartan 50 mg daily  Hx atrial fibrillation During heart cath 05/2016 she went into atrial fibrillation, blood pressure dropped and prior to cardioversion, she  reverted back to sinus rhythm. She was discharged from the hospital on Eliquis. She was able to d/c eliquis 07/2016 after 48 holter monitor revealed no AF.  -Cardiac monitoring - sinus rhythm   Rheumatoid arthritis Positive RF, positive anti-CCP, contractures and erosions. Patient with chronic pain in multiple sites. Not currently on immunosuppresant therapy due to adverse reactions with MTX(bronchiectasis) and Arava (headaches).  -PT/OT  Hashimoto's thyroiditis  Most recent TSH 4.930.  -Continue levothyroxine 75 mcg qd   Dispo: Anticipated discharge 0-1 days.   Toney Rakes, MD 09/23/2017, 10:08 AM Pager: [phone]

## 2017-09-23 NOTE — Consult Note (Signed)
Requesting Physician: Dr. Evette Doffing    Chief Complaint: Stroke  History obtained from:  Patient     HPI:                                                                                                                                         Whitney Mayer is an 83 y.o. female who was initially admitted to the hospital secondary to an MRI which showed an acute occipital CVA.  Initial symptoms that she had were for 6 months of  left-sided scalp numbness/ pins and neeeds. She states this has improved.  For that reason she was admitted.  At present time patient is having no symptoms.  However MRI still did show a acute stroke.  Patient admits that she has had one history of atrial fibrillation during cardiac surgery however she has not had any prolonged atrial fibrillation.  This was evaluated in the past with a 48-hour amatory monitor which did not show any prolonged atrial fibrillation or recurrent atrial fibrillation.  She is on aspirin and a statin.  MRI of cervical spine also shows a lot of DJD and foraminal stenosis.   Patient's A1c was 5.5, LDL was 139, echocardiogram showed a left ventricular ejection fraction of 60-65% with normal wall thickness and no akinesis.  MRI and MRA of head showed no acute flow-limiting stenosis, a 3 mm left ICA aneurysm at the level of the anterior January, 2.5 mm left supraclinoid ICA aneurysm, and acute punctate infarct in the left occipital lobe.  Neurology was asked to see the patient for full stroke workup.   Date last known well: Unable to determine Time last known well: Unable to determine tPA Given: No: symptoms resolved.  NIHSS 0 Modified Rankin: Rankin Score=0  I Past Medical History:  Diagnosis Date  . Hypertension   . Hypothyroidism   . Pneumonia ~ 1943  . Rheumatoid arthritis ([place])    "all over; hands, feet, knees, joints" (05/31/2016)  . Thyroid disease     Past Surgical History:  Procedure Laterality Date  . CARDIAC CATHETERIZATION N/A  06/02/2016   Procedure: Left Heart Cath and Coronary Angiography;  Surgeon: Belva Crome, MD;  Location: [facility];  Service: Cardiovascular;  Laterality: N/A;  . CARDIOVERSION N/A 06/02/2016   Procedure: CARDIOVERSION;  Surgeon: Lorretta Harp, MD;  Location: [facility];  Service: Cardiovascular;  Laterality: N/A;  . DILATION AND CURETTAGE OF UTERUS  1980s  . TUBAL LIGATION  1960s  . VIDEO BRONCHOSCOPY Bilateral 12/07/2016   Procedure: VIDEO BRONCHOSCOPY WITHOUT FLUORO;  Surgeon: Brand Males, MD;  Location: [facility];  Service: Cardiopulmonary;  Laterality: Bilateral;    Family History  Problem Relation Age of Onset  . Stroke Mother   . Kidney disease Brother    Social History:  reports that she has never smoked. She has never used smokeless tobacco. She reports that she does  not drink alcohol or use drugs.  Allergies: No Known Allergies  Medications:                                                                                                                           Prior to Admission:  Prescriptions Prior to Admission  Medication Sig Dispense Refill Last Dose  . alendronate (FOSAMAX) 70 MG tablet Take 1 tablet (70 mg total) by mouth once a week. Take with a full glass of water on an empty stomach. (Patient taking differently: Take 70 mg by mouth every Friday. Take with a full glass of water on an empty stomach.) 52 tablet 3 09/21/2017 at am  . calcium carbonate (OS-CAL - DOSED IN MG OF ELEMENTAL CALCIUM) 1250 (500 Ca) MG tablet Take 1 tablet (500 mg of elemental calcium total) by mouth daily with breakfast. 90 tablet 11 09/22/2017 at am  . Cholecalciferol 1000 units tablet Take 1 tablet (1,000 Units total) by mouth daily. 90 tablet 11 09/22/2017 at am  . diltiazem (CARDIZEM CD) 180 MG 24 hr capsule Take 1 capsule (180 mg total) by mouth daily. 30 capsule 3 09/22/2017 at am  . feeding supplement, ENSURE ENLIVE, (ENSURE ENLIVE) LIQD Take 237 mLs by mouth 2 (two) times daily  between meals. (Patient taking differently: Take 237 mLs by mouth 3 (three) times a week. ) 237 mL 12 Past Week at Unknown time  . folic acid (FOLVITE) 1 MG tablet Take 1 tablet (1 mg total) by mouth daily. 90 tablet 11 09/22/2017 at Unknown time  . hydrOXYzine (ATARAX/VISTARIL) 25 MG tablet Take 25 mg by mouth 3 (three) times daily. FOR ITCHING   09/22/2017 at am  . ibuprofen (ADVIL,MOTRIN) 200 MG tablet Take 400 mg by mouth 2 (two) times daily as needed (for pain).   09/22/2017 at Unknown time  . levocetirizine (XYZAL) 5 MG tablet Take 1 tablet (5 mg total) by mouth every evening. 30 tablet 3 09/21/2017 at pm  . levothyroxine (SYNTHROID, LEVOTHROID) 100 MCG tablet Take 0.5 tablets (50 mcg total) by mouth daily before breakfast. 90 tablet 11 09/22/2017 at am  . losartan (COZAAR) 100 MG tablet Take 0.5 tablets (50 mg total) by mouth daily. 90 tablet 11 09/22/2017 at am  . montelukast (SINGULAIR) 10 MG tablet Take 10 mg by mouth daily.   09/22/2017 at am  . ondansetron (ZOFRAN) 4 MG tablet Take 1 tablet (4 mg total) by mouth 2 (two) times daily. (Patient taking differently: Take 4 mg by mouth 2 (two) times daily as needed for nausea or vomiting. ) 20 tablet 0 PRN at PRN  . ranitidine (ZANTAC) 150 MG tablet Take 150 mg by mouth every morning.   09/22/2017 at am  . traMADol (ULTRAM) 50 MG tablet Take 1 tablet (50 mg total) by mouth every 6 (six) hours as needed. (Patient taking differently: Take 50 mg by mouth every 6 (six) hours as needed (for pain). ) 20 tablet 0 PRN at PRN  Scheduled: . aspirin  300 mg Rectal Daily   Or  . aspirin  325 mg Oral Daily  . calcium carbonate  1 tablet Oral Q breakfast  . cholecalciferol  1,000 Units Oral Daily  . enoxaparin (LOVENOX) injection  40 mg Subcutaneous Q24H  . feeding supplement (ENSURE ENLIVE)  237 mL Oral BID BM  . folic acid  1 mg Oral Daily  . levothyroxine  75 mcg Oral QAC breakfast  . loratadine  10 mg Oral QHS    ROS:                                                                                                                                        History obtained from the patient  General ROS: negative for - chills, fatigue, fever, night sweats, weight gain or weight loss Psychological ROS: negative for - behavioral disorder, hallucinations, memory difficulties, mood swings or suicidal ideation Ophthalmic ROS: negative for - blurry vision, double vision, eye pain or loss of vision ENT ROS: negative for - epistaxis, nasal discharge, oral lesions, sore throat, tinnitus or vertigo Allergy and Immunology ROS: negative for - hives or itchy/watery eyes Hematological and Lymphatic ROS: negative for - bleeding problems, bruising or swollen lymph nodes Endocrine ROS: negative for - galactorrhea, hair pattern changes, polydipsia/polyuria or temperature intolerance Respiratory ROS: negative for - cough, hemoptysis, shortness of breath or wheezing Cardiovascular ROS: negative for - chest pain, dyspnea on exertion, edema or irregular heartbeat Gastrointestinal ROS: negative for - abdominal pain, diarrhea, hematemesis, nausea/vomiting or stool incontinence Genito-Urinary ROS: negative for - dysuria, hematuria, incontinence or urinary frequency/urgency Musculoskeletal ROS: negative for - joint swelling or muscular weakness Neurological ROS: as noted in HPI Dermatological ROS: negative for rash and skin lesion changes  Neurologic Examination:                                                                                                      Blood pressure 116/66, pulse 79, temperature 98.2 F (36.8 C), temperature source Oral, resp. rate 17, height _0  (1.575 m), weight 52.2 kg (115 lb), SpO2 96 %.  HEENT-  Normocephalic, no lesions, without obvious abnormality.  Normal external eye and conjunctiva.  Normal TM's bilaterally.  Normal auditory canals and external ears. Normal external nose, mucus membranes and septum.  Normal  pharynx. Cardiovascular- S1, S2 normal, pulses palpable throughout   Lungs- chest clear, no wheezing, rales, normal symmetric air entry Abdomen- normal findings: bowel sounds normal Extremities- no  edema Lymph-no adenopathy palpable Musculoskeletal- Skin-warm and dry, no hyperpigmentation, vitiligo, or suspicious lesions  Neurological Examination Mental Status: Alert, oriented, thought content appropriate.  Speech fluent without evidence of aphasia.  Able to follow 3 step commands without difficulty. Cranial Nerves: II: Discs flat bilaterally; Visual fields grossly normal,  III,IV, VI: ptosis not present, extra-ocular motions intact bilaterally, pupils equal, round, reactive to light and accommodation V,VII: smile symmetric, facial light touch sensation normal bilaterally VIII: hearing normal bilaterally IX,X: uvula rises symmetrically XI: bilateral shoulder shrug XII: midline tongue extension Motor: Right : Upper extremity   5/5    Left:     Upper extremity   5/5  Lower extremity   5/5     Lower extremity   5/5 Tone and bulk:normal tone throughout; no atrophy noted Sensory: Pinprick and light touch intact throughout, bilaterally Deep Tendon Reflexes: 1+ and symmetric throughout Plantars: Right: downgoing   Left: downgoing Cerebellar: normal finger-to-nose, normal rapid alternating movements and normal heel-to-shin test Gait: normal gait and station       Lab Results: Basic Metabolic Panel:  Recent Labs Lab 09/22/17 1249  NA 139  K 4.2  CL 107  CO2 26  GLUCOSE 82  BUN 21*  CREATININE 0.64  CALCIUM 8.6*    Liver Function Tests:  Recent Labs Lab 09/22/17 1249  AST 14*  ALT 10*  ALKPHOS 84  BILITOT 0.6  PROT 6.9  ALBUMIN 3.2*   No results for input(s): LIPASE, AMYLASE in the last 168 hours. No results for input(s): AMMONIA in the last 168 hours.  CBC:  Recent Labs Lab 09/22/17 1249  WBC 9.3  HGB 12.3  HCT 38.5  MCV 83.3  PLT 226    Cardiac  Enzymes: No results for input(s): CKTOTAL, CKMB, CKMBINDEX, TROPONINI in the last 168 hours.  Lipid Panel:  Recent Labs Lab 09/23/17 0440  CHOL 205*  TRIG 71  HDL 52  CHOLHDL 3.9  VLDL 14  LDLCALC 139*    CBG: No results for input(s): GLUCAP in the last 168 hours.  Microbiology: Results for orders placed or performed during the hospital encounter of 12/07/16  Pneumocystis smear by DFA     Status: None   Collection Time: 12/07/16  8:46 AM  Result Value Ref Range Status   Specimen Source-PJSRC BRONCHIAL ALVEOLAR LAVAGE  Final   Pneumocystis jiroveci Ag NEGATIVE  Final    Comment: Performed at [facility]  Acid Fast Smear (AFB)     Status: None   Collection Time: 12/07/16  8:46 AM  Result Value Ref Range Status   AFB Specimen Processing Concentration  Final   Acid Fast Smear Negative  Final    Comment: (NOTE) Performed At: [facility] [place], [place] [id number] Lindon Romp MD HT:[id number]    Source (AFB) BRONCHIAL ALVEOLAR LAVAGE  Final  Acid Fast Culture with reflexed sensitivities     Status: None   Collection Time: 12/07/16  8:46 AM  Result Value Ref Range Status   Acid Fast Culture Negative  Final    Comment: (NOTE) No acid fast bacilli isolated after 6 weeks. Performed At: [facility] [place], [place] [id number] Lindon Romp MD [id number]    Source of Sample BRONCHIAL ALVEOLAR LAVAGE  Final  Culture, fungus without smear     Status: None   Collection Time: 12/07/16  8:46 AM  Result Value Ref Range Status   Specimen Description BRONCHIAL ALVEOLAR LAVAGE  Final   Special Requests Immunocompromised  Final   Culture NO FUNGUS ISOLATED AFTER 21 DAYS  Final   Report Status 12/28/2016 FINAL  Final  Culture, bal-quantitative     Status: Abnormal   Collection Time: 12/07/16  8:49 AM  Result Value Ref Range Status   Specimen Description BRONCHIAL ALVEOLAR LAVAGE  Final   Special Requests  Immunocompromised  Final   Gram Stain   Final    FEW WBC PRESENT, PREDOMINANTLY PMN RARE GRAM POSITIVE COCCI IN PAIRS    Culture 40,000 COLONIES/mL PSEUDOMONAS AERUGINOSA (A)  Final   Report Status 12/11/2016 FINAL  Final   Organism ID, Bacteria PSEUDOMONAS AERUGINOSA (A)  Final      Susceptibility   Pseudomonas aeruginosa - MIC*    CEFTAZIDIME 4 SENSITIVE Sensitive     CIPROFLOXACIN <=0.25 SENSITIVE Sensitive     GENTAMICIN 4 SENSITIVE Sensitive     IMIPENEM <=0.25 SENSITIVE Sensitive     PIP/TAZO <=4 SENSITIVE Sensitive     CEFEPIME <=1 SENSITIVE Sensitive     * 40,000 COLONIES/mL PSEUDOMONAS AERUGINOSA    Coagulation Studies:  Recent Labs  09/22/17 1249  LABPROT 13.2  INR 1.01    Imaging: Mr Jodene Nam Neck Wo Contrast  Result Date: 09/22/2017 CLINICAL DATA:  Stroke follow-up EXAM: MRA NECK WITHOUT CONTRAST MRA HEAD WITHOUT CONTRAST TECHNIQUE: Angiographic images of the neck were obtained using MRA technique without and with intravenous contast.; Angiographic images of the Circle of Willis were obtained using MRA technique without intravenous contrast. COMPARISON:  Brain MRI from yesterday FINDINGS: MRA NECK FINDINGS No acute finding in the visualized aorta. Three vessel arch branching. Both carotids are tortuous without stenosis, ulceration, or beading. No notable atheromatous changes. Occasionally poor signal in the subclavian and vertebral arteries attributed to direction of flow. This particularly true at the V3 segments. Mild left vertebral artery dominance. No evidence of stenosis. No beading. Diffusion was repeated and is negative for progression of the small left occipital infarct. MRA HEAD FINDINGS Symmetric carotid arteries and branching. No flow limiting stenosis, irregularity, or branch occlusion. There are 2 outpouchings from the left ICA. The larger projects posteriorly from the anterior genu and measures 3 mm. The smaller projects inferiorly from the supraclinoid segment  and measures 2.5 mm. This latter outpouching has a triangular shape and a could be infundibulum with parent vessel below the resolution of MRA. Vertebrobasilar tortuosity. Bilateral mastoid opacification. IMPRESSION: Neck MRA: Unremarkable for age.  No stenosis. Intracranial MRA: 1. No acute finding or flow limiting stenosis. 2. 3 mm left ICA aneurysm at the level of the anterior genu. 3. 2.5 mm left supraclinoid ICA aneurysm or infundibulum. 4. Diffusion was repeated. Known acute punctate infarct in the left occipital lobe. No progression or new site of infarction. Electronically Signed   By: Monte Fantasia M.D.   On: 09/22/2017 16:02   Mr Brain Wo Contrast  Result Date: 09/21/2017 CLINICAL DATA:  Numbness and tingling of right face EXAM: MRI HEAD WITHOUT CONTRAST TECHNIQUE: Multiplanar, multiecho pulse sequences of the brain and surrounding structures were obtained without intravenous contrast. COMPARISON:  None. FINDINGS: Brain: 3 mm focus of restricted diffusion in the left occipital lobe consistent with acute or subacute infarct. Moderate atrophy. Mild chronic microvascular ischemic change in the white matter. Negative for hemorrhage or mass. Vascular: Normal arterial flow voids. Skull and upper cervical spine: Mild basilar invagination with the tip of the dens projecting above the anterior arch of C1. This is causing mild stenosis at the  foramen magnum without cord compression. Sinuses/Orbits: Bilateral mastoid effusion. Mild mucosal edema paranasal sinuses. Left lens replacement. Other: None IMPRESSION: 3 mm acute infarct left occipital lobe. Atrophy and chronic microvascular ischemia Mild basilar invagination with mild narrowing of the foramen magnum. These results will be called to the ordering clinician or representative by the Radiologist Assistant, and communication documented in the PACS or zVision Dashboard. Electronically Signed   By: Franchot Gallo M.D.   On: 09/21/2017 16:41   Mr Cervical  Spine Wo Contrast  Result Date: 09/21/2017 CLINICAL DATA:  Numbness and tingling right face. Sensory abnormalities. EXAM: MRI CERVICAL SPINE WITHOUT CONTRAST TECHNIQUE: Multiplanar, multisequence MR imaging of the cervical spine was performed. No intravenous contrast was administered. COMPARISON:  MRI head 09/21/2017 FINDINGS: Image quality degraded by significant motion. Alignment: Mild retrolisthesis C4-5 and C5-6. Mild anterolisthesis C7-T1. Vertebrae: Negative for fracture or mass Basilar invagination. The dens projects above the anterior arch of C1 into the foramen magnum causing mild stenosis. No cord compression. Cord: Limited cord evaluation due to motion. No obvious cord lesion. Posterior Fossa, vertebral arteries, paraspinal tissues: Disc levels: C1-2:  Basilar invagination with mild spinal stenosis C2-3:  Negative C3-4: Mild disc degeneration and facet degeneration. Bony overgrowth causing mild foraminal narrowing bilaterally C4-5: Disc degeneration and spurring. Bilateral facet hypertrophy. Mild spinal stenosis and mild right foraminal stenosis. Moderate left foraminal stenosis C5-6: Disc degeneration and spondylosis with diffuse uncinate spurring and bilateral facet degeneration. Moderate spinal and foraminal stenosis bilaterally. C6-7:  Mild disc degeneration without stenosis C7-T1:  Mild anterolisthesis.  No significant stenosis. 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 Moderate left foraminal stenosis C4-5 Moderate spinal stenosis and foraminal stenosis bilaterally at C5-6 Electronically Signed   By: Franchot Gallo M.D.   On: 09/21/2017 16:53   Mr Jodene Nam Head Wo Contrast  Result Date: 09/22/2017 CLINICAL DATA:  Stroke follow-up EXAM: MRA NECK WITHOUT CONTRAST MRA HEAD WITHOUT CONTRAST TECHNIQUE: Angiographic images of the neck were obtained  using MRA technique without and with intravenous contast.; Angiographic images of the Circle of Willis were obtained using MRA technique without intravenous contrast. COMPARISON:  Brain MRI from yesterday FINDINGS: MRA NECK FINDINGS No acute finding in the visualized aorta. Three vessel arch branching. Both carotids are tortuous without stenosis, ulceration, or beading. No notable atheromatous changes. Occasionally poor signal in the subclavian and vertebral arteries attributed to direction of flow. This particularly true at the V3 segments. Mild left vertebral artery dominance. No evidence of stenosis. No beading. Diffusion was repeated and is negative for progression of the small left occipital infarct. MRA HEAD FINDINGS Symmetric carotid arteries and branching. No flow limiting stenosis, irregularity, or branch occlusion. There are 2 outpouchings from the left ICA. The larger projects posteriorly from the anterior genu and measures 3 mm. The smaller projects inferiorly from the supraclinoid segment and measures 2.5 mm. This latter outpouching has a triangular shape and a could be infundibulum with parent vessel below the resolution of MRA. Vertebrobasilar tortuosity. Bilateral mastoid opacification. IMPRESSION: Neck MRA: Unremarkable for age.  No stenosis. Intracranial MRA: 1. No acute finding or flow limiting stenosis. 2. 3 mm left ICA aneurysm at the level of the anterior genu. 3. 2.5 mm left supraclinoid ICA aneurysm or infundibulum. 4. Diffusion was repeated. Known acute punctate infarct in the left occipital lobe. No progression or new site of infarction. Electronically Signed  By: Monte Fantasia M.D.   On: 09/22/2017 16:02       Assessment and plan discussed with with attending physician and they are in agreement.    Etta Quill PA-C [facility] [phone]  09/23/2017, 10:43 AM   Assessment: 83 y.o. female presenting with right occipital "itching and pins and needls.  She does  have a history of cervical DJD. The stroke noted on MRI has no correlation with her symptoms, I suspect it is due to small vessel disease.  She may have a high cervical radiculopathy causing intermittent paresthesias, though occipital neuralgia could also be playing a role.  Either way, with her not being bothered very much by it I would favor conservative therapy at this time.  Likewise, her aneurysms are small and I would just favor observation.  She does have a high LDL, and I would favor more aggressive statin therapy.  Stroke Risk Factors - hypertension    1) increase aspirin to 325 mg twice daily 2) more aggressive statin therapy 3) follow-up with neurology for aneurysms and punctate infarct   Roland Rack, MD [facility] [phone]  If 7pm- 7am, please page neurology on call as listed in [place].

## 2017-09-24 NOTE — Discharge Summary (Signed)
Name: Whitney Mayer MRN: [id number] DOB: 07/15/1932 83 y.o. PCP: Eulah Pont, MD  Date of Admission: 09/22/2017 10:28 AM Date of Discharge: 09/24/2017 Attending Physician: Erlinda Hong, MD  Discharge Diagnosis: 1. CVA - left occipital punctate infarct  Active Problems:   CVA (cerebral vascular accident) [facility])   Discharge Medications: Allergies as of 09/23/2017   No Known Allergies     Medication List    TAKE these medications   alendronate 70 MG tablet Commonly known as:  FOSAMAX Take 1 tablet (70 mg total) by mouth once a week. Take with a full glass of water on an empty stomach. What changed:  when to take this  additional instructions Notes to patient:  Continue home schedule   aspirin 325 MG tablet Take 1 tablet (325 mg total) by mouth 2 (two) times daily.   atorvastatin 80 MG tablet Commonly known as:  LIPITOR Take 1 tablet (80 mg total) by mouth daily at 6 PM.   calcium carbonate 1250 (500 Ca) MG tablet Commonly known as:  OS-CAL - dosed in mg of elemental calcium Take 1 tablet (500 mg of elemental calcium total) by mouth daily with breakfast.   Cholecalciferol 1000 units tablet Take 1 tablet (1,000 Units total) by mouth daily.   diltiazem 180 MG 24 hr capsule Commonly known as:  CARTIA XT Take 1 capsule (180 mg total) by mouth daily.   feeding supplement (ENSURE ENLIVE) Liqd Take 237 mLs by mouth 2 (two) times daily between meals. What changed:  when to take this   folic acid 1 MG tablet Commonly known as:  FOLVITE Take 1 tablet (1 mg total) by mouth daily.   hydrOXYzine 25 MG tablet Commonly known as:  ATARAX/VISTARIL Take 25 mg by mouth 3 (three) times daily. FOR ITCHING Notes to patient:  Continue Home schedule   ibuprofen 200 MG tablet Commonly known as:  ADVIL,MOTRIN Take 400 mg by mouth 2 (two) times daily as needed (for pain). Notes to patient:  Continue home schedule   levocetirizine 5 MG tablet Commonly known as:  XYZAL Take 1  tablet (5 mg total) by mouth every evening.   levothyroxine 75 MCG tablet Commonly known as:  SYNTHROID, LEVOTHROID Take 1 tablet (75 mcg total) by mouth daily before breakfast. What changed:  medication strength  how much to take   losartan 100 MG tablet Commonly known as:  COZAAR Take 0.5 tablets (50 mg total) by mouth daily.   montelukast 10 MG tablet Commonly known as:  SINGULAIR Take 10 mg by mouth daily.   ondansetron 4 MG tablet Commonly known as:  ZOFRAN Take 1 tablet (4 mg total) by mouth 2 (two) times daily. What changed:  when to take this  reasons to take this Notes to patient:  Continue home schedule   ranitidine 150 MG tablet Commonly known as:  ZANTAC Take 150 mg by mouth every morning.   traMADol 50 MG tablet Commonly known as:  ULTRAM Take 1 tablet (50 mg total) by mouth every 6 (six) hours as needed. What changed:  reasons to take this Notes to patient:  Continue home schedule       Disposition and follow-up:   WhitneyWhitney Mayer was discharged from [facility] in Good condition.  At the hospital follow up visit please address:  1.  Left Occipital Infarct Ms. Whitney Mayer was admitted to [facility] and the Internal Medicine Teaching service after an outpatient MRI of the brain showed a 3 mm acute/subacute  left occipital infarct. The MRI was obtained because the patient had complaints of persistent right-sided scalp numbness. The patient was directly admitted and upon arrival was hemodynamically stable with CMET and CBC within normal limits. She had no focal neurological deficits on exam and vision was intact without any visual field deficits. Transthoracic echocardiogram, MRA of the head and neck, A1C, and lipid panel were obtained for stroke work up. Echo showed normal systolic function, EF 60% to 65%, grade 1 diastolic dysfunction, and an atrial septal aneurysm. MRA of the neck revealed no stenosis. Intracranial MRA had no acute  finding or flow limiting stenosis, 2 small left ICA aneurysms at the level of the anterior genu, with no progression or new site of infarction. LDL was elevated 139, and hemoglobin A1C was within normal limits. The patient was also evaluated by Neurology.   Per Neurology, the infarct on MRI did not correlate with her symptoms of scalp paresthesias. The paresthesias are likely due to cervical radiculopathy or occipital neuralgia. The left occipital infarct was not felt to be cardioembolic, and was most likely due to small vessel disease. Neurology recommended high dose aspirin twice daily and high intensity statin for secondary stroke prevention. The patient was also evaluated by physical, occupational, and speech therapy who had no further recommendations.   2. Hypertension Whitney Mayer remained normotensive through the majority of her hospitalization, with intermittent mild hypertensive BP readings. Her blood pressure medications were held during hospitalization, but resumed at discharge: Diltiazem 180 mg daily or Losartan 50 mg daily.   3. Hashimoto's thyroiditis The patients levothyroxine dose was increased from 50 mcg to 75 mcg while hospitalized due to most recent TSH of 4.93. She was discharged on the increased dose.   2.  Labs / imaging needed at time of follow-up: none  3.  Pending labs/ test needing follow-up: none   Follow-up Appointments: Follow-up Information    [facility] Follow up in 1 week(s).   Contact information: [address] [name] [address] [phone]          Hospital Course by problem list: Active Problems:   CVA (cerebral vascular accident) (HCC)   1. CVA - Left Occipital Infarct  -Punctate infarct not associated with the patients scalp paresthesias -Secondary stroke prevention:  -Aspirin to 325 mg twice daily  -Lipitor 80 mg daily -Patient will need Neurology follow up for punctate infarct as well as aneurysms found  on MR  -No recommendations from PT/OT  2. Hypothyroidism -Most recent TSH was elevated 4.930 -Increased dose of levothyroxine from 50 mcg  to 75 mcg while hospitalized -Patient was discharged on the increased dose   Discharge Vitals:   BP (!) 142/60 (BP Location: Right Arm)   Pulse 65   Temp 98.2 F (36.8 C) (Oral)   Resp (!) 21   Ht 5\' 2"  (1.575 m)   Wt 115 lb (52.2 kg)   LMP  (LMP Unknown)   SpO2 95%   BMI 21.03 kg/m   Pertinent Labs, Studies, and Procedures:  BMP Latest Ref Rng & Units 09/22/2017 09/04/2017 03/15/2017  Glucose 65 - 99 mg/dL 82 03/17/2017) 008(Q)  BUN 6 - 20 mg/dL 76(P) 22 18  Creatinine 0.44 - 1.00 mg/dL 95(K 9.32 6.71  BUN/Creat Ratio 12 - 28 - 34(H) -  Sodium 135 - 145 mmol/L 139 141 138  Potassium 3.5 - 5.1 mmol/L 4.2 4.1 3.9  Chloride 101 - 111 mmol/L 107 103 104  CO2 22 -  32 mmol/L 26 21 22   Calcium 8.9 - 10.3 mg/dL ) 9.2 8.8   CBC Latest Ref Rng & Units 09/22/2017 03/15/2017 10/31/2016  WBC 4.0 - 10.5 K/uL 9.3 6.8 10.2  Hemoglobin 12.0 - 15.0 g/dL 14/03/2016 11.5(L) 11.5(L)  Hematocrit 36.0 - 46.0 % 38.5 36.4 36.5  Platelets 150 - 400 K/uL 226 291 303   MRI Brain WO Contrast IMPRESSION: 3 mm acute infarct left occipital lobe. Atrophy and chronic microvascular ischemia Mild basilar invagination with mild narrowing of the foramen magnum.  MR CERVICAL SPINE IMPRESSION: Basilar invagination with mild stenosis at the foramen magnum  Mild foraminal narrowing bilaterally due to spurring at C3-4  Mild spinal stenosis and mild right foraminal stenosis C4-5. Moderate left foraminal stenosis C4-5  Moderate spinal stenosis and foraminal stenosis bilaterally at C5-6  MRA HEAD and NECK IMPRESSION:  Neck MRA: Unremarkable for age.  No stenosis.  Intracranial MRA: 1. No acute finding or flow limiting stenosis. 2. 3 mm left ICA aneurysm at the level of the anterior genu. 3. 2.5 mm left supraclinoid ICA aneurysm or infundibulum. 4. Diffusion was  repeated. Known acute punctate infarct in the left occipital lobe. No progression or new site of infarction.  TRANSTHORACIC ECHOCARDIOGRAM ------------------------------------------------------------------- Study Conclusions  - Left ventricle: The cavity size was normal. Wall thickness was   increased in a pattern of mild LVH. There was moderate focal   basal hypertrophy of the septum. Systolic function was normal.   The estimated ejection fraction was in the range of 60% to 65%.   Doppler parameters are consistent with abnormal left ventricular   relaxation (grade 1 diastolic dysfunction). - Aortic valve: There was mild regurgitation. - Left atrium: The atrium was mildly dilated. - Atrial septum: There was an atrial septal aneurysm.  Discharge Instructions: Discharge Instructions    Diet - low sodium heart healthy    Complete by:  As directed    Discharge instructions    Complete by:  As directed    Please evaluate after physical therapy session.   Whitney Mayer,   Your neurological exam was normal. The right sided scalp numbness and tingling you experience is not coming from the blockage in your brain. It is most likely coming from a compressed nerve in your neck due to arthritis changes in your vertebrae. In order to prevent further blockages in your brain from occurring, I started you on two new medications. One is a cholesterol medication called Atorvastatin 80 mg (Lipitor). Please take 1 tablet of Lipitor daily. I also started you on high dose aspirin. Please take 325 mg of aspirin twice daily. These medicines are going to help prevent future blockages from occurring.  Continue all other medications that you were on prior to discharge. I made no changes to your previous medications.   Please make a follow up appointment at the Internal Medicine Center within 1 week of discharge from the hospital. Please follow up with the Neurologists as well.   Increase activity slowly    Complete  by:  As directed       Signed: Ethelene Hal, MD 09/24/2017, 8:45 AM   Pager: [phone]

## 2017-10-01 ENCOUNTER — Encounter: Payer: Self-pay | Admitting: Internal Medicine

## 2017-10-01 ENCOUNTER — Ambulatory Visit (INDEPENDENT_AMBULATORY_CARE_PROVIDER_SITE_OTHER): Payer: Medicare HMO | Admitting: Internal Medicine

## 2017-10-01 VITALS — BP 118/64 | HR 80 | Ht 62.0 in | Wt 118.0 lb

## 2017-10-01 DIAGNOSIS — J479 Bronchiectasis, uncomplicated: Secondary | ICD-10-CM | POA: Diagnosis not present

## 2017-10-01 NOTE — Patient Instructions (Signed)
ICD-10-CM   1. Bronchiectasis without acute exacerbation (HCC) J47.9     Clinically stable without flare up or symptoms Because you are not any immune suppressants or having active symptoms we can space out follow-up  Plan 1 year or sooner if needed

## 2017-10-01 NOTE — Progress Notes (Signed)
Subjective:     Patient ID: Whitney Mayer, female   DOB: 01-Feb-1932, 83 y.o.   MRN: [id number]  HPI   OV 11/15/2016  Chief Complaint  Patient presents with  . Advice Only    referred by Dr. Garen Grams after ct chest on 12/15    83 year old female accompanied by her daughter Whitney Mayer. They're originally from [place]. In July 2017 patient was visiting her granddaughter's graduation and she got hospitalized for atypical chest pain and underwent cardiac catheterization. She also had brief atrial fibrillation for which she was on anticoagulation for a few months. CT chest shows some atelectasis or pulmonary embolism was ruled out. I personally visualized the CT chest. Subsequently she does not have any shortness of breath. It is to be noted that at baseline she has debilitating rheumatoid arthritis for many decades. At least for the last 7 years she's had significant deformity in her hands. Her daughter believes this was because of poor specialty care in [place]. Therefore for the last year or so she has been wheelchair-bound. She requires help from family members decided to transfer from the chair to the bed. She is able to lie flat except that her knees have to be flexed. She is able to eat by herself although with difficulty because of a deformed fingers. She is in significant pain and also has itching because of her rheumatoid arthritis. The itching though might not be related to rheumatoid arthritis and she has an allergy evaluation pending. Subsequently after immigrating in July 2017 to [place], [place] she establish with rheumatologist Dr. Tommie Raymond was started on methotrexate 10 mg once a week. She says that shortly after starting methotrexate daughter started noticing dyspnea particularly during transfers to the family members are helping her out. There is no associated wheezing or cough. She then saw Dr Estanislado Pandy 10/31/2016 at which time the methotrexate was stopped. She started  noticing improvement in dyspnea  according to her daughter Whitney Mayer. Since then dyspnea is resolved. She did have follow-up CT chest high resolution 11/10/2016 and this shows some increase peribronchial nodularity consistent with Mycobacterium avium complex that is increased since July 2017. However the basal atelectasis is resolved.  Patient and her daughter Whitney Mayer report significant pain from her rheumatoid arthritis and a very desperate for pain relief with advanced disease modifying agents of rheumatoi  d arthritis so that she can improve her quality of life.   IMPRESSION: 1. Pulmonary parenchymal pattern of peribronchial thickening, mild bronchiectasis and peribronchovascular nodularity is likely mildly progressive from 05/31/2016 and indicative of mycobacterium avium complex. 2. No evidence of fibrotic interstitial lung disease. 3. 4.3 cm ascending aortic aneurysm. Recommend annual imaging followup by CTA or MRA. This recommendation follows 2010 ACCF/AHA/AATS/ACR/ASA/SCA/SCAI/SIR/STS/SVM Guidelines for the Diagnosis and Management of Patients with Thoracic Aortic Disease. Circulation. 2010; 121: U542-H062. 4. Aortic atherosclerosis (ICD10-170.0). Coronary artery calcification.   Electronically Signed   By: Lorin Picket M.D.   On: 11/10/2016 11:23   has a past medical history of Hypertension; Hypothyroidism; Pneumonia (~ 1943); Rheumatoid arthritis ([place]); and Thyroid disease.   reports that she has never smoked. She has never used smokeless tobacco.   OV 02/12/2017  Chief Complaint  Patient presents with  . Follow-up    Pt denies SOB, CP/tightness and chest congestion. Pt states she has an infrequent dry cough.    Follow-up mild mid zone and lower zone bronchiectasis in the setting of rheumatoid arthritis  She is here with her daughter Whitney Mayer. She again tells me  that after taking one month of methotrexate she done worsening shortness of breath and had to stop  it. When she stopped methotrexate dyspnea did get better. Her 2017 CT chest showed mid and lower zone bronchiectasis. Therefore early January 2018 we did bronchoscopy with lavage. The initial suspicion was  mycobacterium avium complex. However bronchoscopy results showed Pseudomonas. We treated her with ciprofloxacin. She and her daughter tell me that the ciprofloxacin didn't help and she feels better overall although they're not able to specify how exactly. I suspect that her dyspnea is better. Her main issues that she has disabling pain from her rheumatoid arthritis and the disability associated with it. She really wants a medication for this but is worried about infection risk. She is due to see rheumatology on 02/28/2017  OV 10/01/2017  Chief Complaint  Patient presents with  . Follow-up    Pt states that she has been doing good. Denies any CP, cough, or SOB.   Follow-up mild mid zone and lower zone bronchiectasis in the setting of rheumatoid arthritis  6 month follow-up with daughter Whitney Mayer. In the interim she did have any occipital stroke but has fully recovered neurologically. She has seen Dr. D rheumatology and is opted against taking any immune modulators. She does take painkillers for her chronic pain from burnt out rheumatoid arthritis. At this point in time she does not have any respiratory complaints and is feeling good. She says she is up-to-date with her flu shot     has a past medical history of Hypertension, Hypothyroidism, Pneumonia (~ 1943), Rheumatoid arthritis ([place]), and Thyroid disease.   reports that  has never smoked. she has never used smokeless tobacco.  Past Surgical History:  Procedure Laterality Date  . DILATION AND CURETTAGE OF UTERUS  1980s  . TUBAL LIGATION  1960s    No Known Allergies  Immunization History  Administered Date(s) Administered  . Influenza, High Dose Seasonal PF 09/04/2017  . Influenza-Unspecified 08/29/2016  . Pneumococcal Conjugate-13  08/29/2016    Family History  Problem Relation Age of Onset  . Stroke Mother   . Kidney disease Brother      Current Outpatient Medications:  .  alendronate (FOSAMAX) 70 MG tablet, Take 1 tablet (70 mg total) by mouth once a week. Take with a full glass of water on an empty stomach. (Patient taking differently: Take 70 mg by mouth every Friday. Take with a full glass of water on an empty stomach.), Disp: 52 tablet, Rfl: 3 .  aspirin 325 MG tablet, Take 1 tablet (325 mg total) by mouth 2 (two) times daily., Disp: 60 tablet, Rfl: 0 .  atorvastatin (LIPITOR) 80 MG tablet, Take 1 tablet (80 mg total) by mouth daily at 6 PM., Disp: 30 tablet, Rfl: 0 .  calcium carbonate (OS-CAL - DOSED IN MG OF ELEMENTAL CALCIUM) 1250 (500 Ca) MG tablet, Take 1 tablet (500 mg of elemental calcium total) by mouth daily with breakfast., Disp: 90 tablet, Rfl: 11 .  Cholecalciferol 1000 units tablet, Take 1 tablet (1,000 Units total) by mouth daily., Disp: 90 tablet, Rfl: 11 .  diltiazem (CARDIZEM CD) 180 MG 24 hr capsule, Take 1 capsule (180 mg total) by mouth daily., Disp: 30 capsule, Rfl: 3 .  feeding supplement, ENSURE ENLIVE, (ENSURE ENLIVE) LIQD, Take 237 mLs by mouth 2 (two) times daily between meals. (Patient taking differently: Take 237 mLs by mouth 3 (three) times a week. ), Disp: 237 mL, Rfl: 12 .  folic acid (FOLVITE) 1 MG  tablet, Take 1 tablet (1 mg total) by mouth daily., Disp: 90 tablet, Rfl: 11 .  hydrOXYzine (ATARAX/VISTARIL) 25 MG tablet, Take 25 mg by mouth 3 (three) times daily. FOR ITCHING, Disp: , Rfl:  .  ibuprofen (ADVIL,MOTRIN) 200 MG tablet, Take 400 mg by mouth 2 (two) times daily as needed (for pain)., Disp: , Rfl:  .  levocetirizine (XYZAL) 5 MG tablet, Take 1 tablet (5 mg total) by mouth every evening., Disp: 30 tablet, Rfl: 3 .  levothyroxine (SYNTHROID, LEVOTHROID) 75 MCG tablet, Take 1 tablet (75 mcg total) by mouth daily before breakfast., Disp: 30 tablet, Rfl: 0 .  losartan (COZAAR)  100 MG tablet, Take 0.5 tablets (50 mg total) by mouth daily., Disp: 90 tablet, Rfl: 11 .  montelukast (SINGULAIR) 10 MG tablet, Take 10 mg by mouth daily., Disp: , Rfl:  .  ondansetron (ZOFRAN) 4 MG tablet, Take 1 tablet (4 mg total) by mouth 2 (two) times daily. (Patient taking differently: Take 4 mg by mouth 2 (two) times daily as needed for nausea or vomiting. ), Disp: 20 tablet, Rfl: 0 .  ranitidine (ZANTAC) 150 MG tablet, Take 150 mg by mouth every morning., Disp: , Rfl:  .  traMADol (ULTRAM) 50 MG tablet, Take 1 tablet (50 mg total) by mouth every 6 (six) hours as needed. (Patient taking differently: Take 50 mg by mouth every 6 (six) hours as needed (for pain). ), Disp: 20 tablet, Rfl: 0   Review of Systems     Objective:   Physical Exam  Constitutional: She is oriented to person, place, and time. No distress.  Frail female sitting on a wheelchair  HENT:  Head: Normocephalic and atraumatic.  Right Ear: External ear normal.  Left Ear: External ear normal.  Mouth/Throat: Oropharynx is clear and moist. No oropharyngeal exudate.  Eyes: Conjunctivae and EOM are normal. Pupils are equal, round, and reactive to light. Right eye exhibits no discharge. Left eye exhibits no discharge. No scleral icterus.  Neck: Normal range of motion. Neck supple. No JVD present. No tracheal deviation present. No thyromegaly present.  Cardiovascular: Normal rate, regular rhythm, normal heart sounds and intact distal pulses. Exam reveals no gallop and no friction rub.  No murmur heard. Pulmonary/Chest: Effort normal and breath sounds normal. No respiratory distress. She has no wheezes. She has no rales. She exhibits no tenderness.  Abdominal: Soft. Bowel sounds are normal. She exhibits no distension and no mass. There is no tenderness. There is no rebound and no guarding.  Musculoskeletal: She exhibits deformity. She exhibits no edema or tenderness.  Advanced deformities of rheumatoid arthritis   Lymphadenopathy:    She has no cervical adenopathy.  Neurological: She is alert and oriented to person, place, and time. She has normal reflexes. No cranial nerve deficit. She exhibits normal muscle tone. Coordination normal.  Skin: Skin is warm and dry. No rash noted. She is not diaphoretic. No erythema. No pallor.  Psychiatric: She has a normal mood and affect. Her behavior is normal. Judgment and thought content normal.  Vitals reviewed.   Vitals:   10/01/17 1458  BP: 118/64  Pulse: 80  SpO2: 93%  Weight: 118 lb (53.5 kg)  Height: _0  (1.575 m)    Estimated body mass index is 21.58 kg/m as calculated from the following:   Height as of this encounter: _1  (1.575 m).   Weight as of this encounter: 118 lb (53.5 kg).      Assessment:  ICD-10-CM   1. Bronchiectasis without acute exacerbation (HCC) J47.9        Plan:      Clinically stable without flare up or symptoms Because you are not any immune suppressants or having active symptoms we can space out follow-up  Plan 1 year or sooner if needed   Dr. Brand Males, M.D., [facility].C.P Pulmonary and Critical Care Medicine Staff Physician [facility] Pager: [phone], If no answer or between  15:00h - 7:00h: call [phone]  10/01/2017 3:28 PM

## 2017-10-29 ENCOUNTER — Ambulatory Visit (INDEPENDENT_AMBULATORY_CARE_PROVIDER_SITE_OTHER): Payer: Medicare HMO | Admitting: Internal Medicine

## 2017-10-29 ENCOUNTER — Encounter (INDEPENDENT_AMBULATORY_CARE_PROVIDER_SITE_OTHER): Payer: Self-pay

## 2017-10-29 ENCOUNTER — Other Ambulatory Visit: Payer: Self-pay

## 2017-10-29 VITALS — BP 134/79 | HR 94 | Temp 98.4°F | Ht 62.0 in

## 2017-10-29 DIAGNOSIS — J069 Acute upper respiratory infection, unspecified: Secondary | ICD-10-CM

## 2017-10-29 DIAGNOSIS — B9789 Other viral agents as the cause of diseases classified elsewhere: Principal | ICD-10-CM

## 2017-10-29 DIAGNOSIS — M069 Rheumatoid arthritis, unspecified: Secondary | ICD-10-CM

## 2017-10-29 MED ORDER — FLUTICASONE PROPIONATE 50 MCG/ACT NA SUSP: 1.0000 | Freq: Every day | NASAL | 2 refills | Status: DC

## 2017-10-29 MED ORDER — BENZONATATE 100 MG PO CAPS: 100.0000 mg | ORAL_CAPSULE | Freq: Three times a day (TID) | ORAL | 1 refills | Status: DC | PRN

## 2017-10-29 NOTE — Patient Instructions (Addendum)
Thank you for allowing [place] to care for you.  For your Cough: - We suspect this is due to a viral infection - We have ordered you Flonase to be sprayed into each nostril, daily, to reduce drainnage - We have ordered Tessalon pearls, take as directed for cough - Continue taking Robitussin for your cough - We recommend purchasing a neti pot or other nasal rinse - We also recommend warm showers / Bathing to loosen congestion  Return to clinic if you develop worsening symptoms or your symptoms fail to resolve after an additional 2 weeks.

## 2017-10-29 NOTE — Assessment & Plan Note (Signed)
Patient presents with 1 week of cough. Patient states that the cough is non-productive, worse when she lays down at night, and mildly relieved by robitussin. She states the the cough feels deep. She has had her flu shot this year. She denies fevers, chills, dyspnea, new aches.  Presentation consistent with Viral URI.  - Flonase spray, each nare, daily - Tessalon pearls, TID PRN cough - Continue Robitussin, PRN cough - Recommend purchasing a neti pot or other nasal rinse - Recommend warm showers / Bathing to loosen congestion

## 2017-10-29 NOTE — Progress Notes (Signed)
   CC: Cough  HPI:  [name] is a 83 y.o. F with PMHx listed below presenting for Cough. Please see the A&P for the status of the patient's chronic medical problems.  Patient presents with 1 week of cough. Patient states that the cough is non-productive, worse when she lays down at night, and mildly relieved by robitussin. She states the the cough feels deep. She has had her flu shot this year. She denies fevers, chills, dyspnea, new aches.   Past Medical History:  Diagnosis Date  . Hypertension   . Hypothyroidism   . Pneumonia ~ 1943  . Rheumatoid arthritis (HCC)    "all over; hands, feet, knees, joints" (05/31/2016)  . Thyroid disease    Review of Systems:  Performed and negative except as otherwise indicated.  Physical Exam:  Vitals:   10/29/17 1353  BP: 134/79  Pulse: 94  Temp: 98.4 F (36.9 C)  TempSrc: Oral  SpO2: 94%  Height: 5\' 2"  (1.575 m)   Physical Exam  Constitutional: She is oriented to person, place, and time. She appears well-developed and well-nourished. No distress.  HENT:  Head: Normocephalic and atraumatic.  Mouth/Throat: Oropharynx is clear and moist. No oropharyngeal exudate.  Eyes: EOM are normal. Right eye exhibits no discharge. Left eye exhibits no discharge.  Cardiovascular: Normal rate, regular rhythm and normal heart sounds.  Pulmonary/Chest: Effort normal and breath sounds normal. No respiratory distress.  Abdominal: Soft. Bowel sounds are normal. She exhibits no distension.  Musculoskeletal: She exhibits no edema.  RA Deformities  Neurological: She is alert and oriented to person, place, and time.  Skin: Skin is warm and dry.     Assessment & Plan:   See Encounters Tab for problem based charting.  Patient seen with Dr. 

## 2017-10-31 NOTE — Progress Notes (Signed)
Internal Medicine Clinic Attending  I saw and evaluated the patient.  I personally confirmed the key portions of the history and exam documented by Dr. Melvin and I reviewed pertinent patient test results.  The assessment, diagnosis, and plan were formulated together and I agree with the documentation in the resident's note.  

## 2018-01-07 ENCOUNTER — Other Ambulatory Visit: Payer: Self-pay | Admitting: *Deleted

## 2018-01-08 ENCOUNTER — Other Ambulatory Visit: Payer: Self-pay

## 2018-01-08 ENCOUNTER — Encounter (HOSPITAL_COMMUNITY): Payer: Self-pay

## 2018-01-08 ENCOUNTER — Emergency Department (HOSPITAL_COMMUNITY): Payer: Medicare HMO

## 2018-01-08 ENCOUNTER — Observation Stay (HOSPITAL_COMMUNITY)
Admission: EM | Admit: 2018-01-08 | Discharge: 2018-01-09 | Disposition: A | Discharge status: Home / Self Care | Payer: Medicare HMO | Attending: Internal Medicine | Admitting: Internal Medicine

## 2018-01-08 DIAGNOSIS — I10 Essential (primary) hypertension: Secondary | ICD-10-CM | POA: Diagnosis not present

## 2018-01-08 DIAGNOSIS — R071 Chest pain on breathing: Secondary | ICD-10-CM

## 2018-01-08 DIAGNOSIS — R079 Chest pain, unspecified: Secondary | ICD-10-CM

## 2018-01-08 DIAGNOSIS — Z8673 Personal history of transient ischemic attack (TIA), and cerebral infarction without residual deficits: Secondary | ICD-10-CM | POA: Insufficient documentation

## 2018-01-08 DIAGNOSIS — I2510 Atherosclerotic heart disease of native coronary artery without angina pectoris: Secondary | ICD-10-CM | POA: Diagnosis not present

## 2018-01-08 DIAGNOSIS — M549 Dorsalgia, unspecified: Secondary | ICD-10-CM | POA: Diagnosis not present

## 2018-01-08 DIAGNOSIS — Z79899 Other long term (current) drug therapy: Secondary | ICD-10-CM | POA: Diagnosis not present

## 2018-01-08 DIAGNOSIS — E039 Hypothyroidism, unspecified: Secondary | ICD-10-CM | POA: Insufficient documentation

## 2018-01-08 DIAGNOSIS — R0789 Other chest pain: Principal | ICD-10-CM | POA: Insufficient documentation

## 2018-01-08 DIAGNOSIS — M069 Rheumatoid arthritis, unspecified: Secondary | ICD-10-CM

## 2018-01-08 DIAGNOSIS — R0602 Shortness of breath: Secondary | ICD-10-CM | POA: Diagnosis not present

## 2018-01-08 DIAGNOSIS — Z7982 Long term (current) use of aspirin: Secondary | ICD-10-CM | POA: Diagnosis not present

## 2018-01-08 DIAGNOSIS — J189 Pneumonia, unspecified organism: Secondary | ICD-10-CM | POA: Diagnosis not present

## 2018-01-08 DIAGNOSIS — R0902 Hypoxemia: Secondary | ICD-10-CM | POA: Diagnosis not present

## 2018-01-08 DIAGNOSIS — D72829 Elevated white blood cell count, unspecified: Secondary | ICD-10-CM

## 2018-01-08 HISTORY — DX: Procedure and treatment not carried out because of patient's decision for reasons of belief and group pressure: Z53.1

## 2018-01-08 HISTORY — DX: Reserved for inherently not codable concepts without codable children: IMO0001

## 2018-01-08 HISTORY — DX: Pure hypercholesterolemia, unspecified: E78.00

## 2018-01-08 LAB — BASIC METABOLIC PANEL
Anion gap: 11 (ref 5–15)
BUN: 9 mg/dL (ref 6–20)
CO2: 22 mmol/L (ref 22–32)
Calcium: 8.5 mg/dL — ABNORMAL LOW (ref 8.9–10.3)
Chloride: 103 mmol/L (ref 101–111)
Creatinine, Ser: 0.54 mg/dL (ref 0.44–1.00)
GFR calc Af Amer: >60 mL/min (ref >60)
Glucose, Bld: 101 mg/dL — ABNORMAL HIGH (ref 65–99)
POTASSIUM: 3.6 mmol/L (ref 3.5–5.1)
SODIUM: 136 mmol/L (ref 135–145)

## 2018-01-08 LAB — SEDIMENTATION RATE: Sed Rate: 70 mm/hr — ABNORMAL HIGH (ref 0–22)

## 2018-01-08 LAB — CBC
HCT: 33.7 % — ABNORMAL LOW (ref 36.0–46.0)
HEMATOCRIT: 35.8 % — AB (ref 36.0–46.0)
HEMOGLOBIN: 11.4 g/dL — AB (ref 12.0–15.0)
Hemoglobin: 10.5 g/dL — ABNORMAL LOW (ref 12.0–15.0)
MCH: 26.2 pg (ref 26.0–34.0)
MCH: 25.6 pg — AB (ref 26.0–34.0)
MCHC: 31.8 g/dL (ref 30.0–36.0)
MCHC: 31.2 g/dL (ref 30.0–36.0)
MCV: 82.3 fL (ref 78.0–100.0)
MCV: 82.2 fL (ref 78.0–100.0)
PLATELETS: 313 10*3/uL (ref 150–400)
Platelets: 367 10*3/uL (ref 150–400)
RBC: 4.35 MIL/uL (ref 3.87–5.11)
RBC: 4.1 MIL/uL (ref 3.87–5.11)
RDW: 15.5 % (ref 11.5–15.5)
RDW: 15.6 % — ABNORMAL HIGH (ref 11.5–15.5)
WBC: 15.6 10*3/uL — AB (ref 4.0–10.5)
WBC: 19.5 10*3/uL — ABNORMAL HIGH (ref 4.0–10.5)

## 2018-01-08 LAB — CREATININE, SERUM
CREATININE: 0.63 mg/dL (ref 0.44–1.00)
GFR calc Af Amer: >60 mL/min (ref >60)
GFR calc non Af Amer: >60 mL/min (ref >60)

## 2018-01-08 LAB — I-STAT TROPONIN, ED: Troponin i, poc: 0 ng/mL (ref 0.00–0.08)

## 2018-01-08 LAB — TROPONIN I

## 2018-01-08 LAB — BRAIN NATRIURETIC PEPTIDE: B NATRIURETIC PEPTIDE 5: 125.6 pg/mL — AB (ref 0.0–100.0)

## 2018-01-08 MED ORDER — MONTELUKAST SODIUM 10 MG PO TABS
10.0000 mg | ORAL_TABLET | Freq: Every day | ORAL | Status: DC
  Administered 2018-01-09: 10 mg via ORAL
  Filled 2018-01-08: qty 1

## 2018-01-08 MED ORDER — SODIUM CHLORIDE 0.9% FLUSH
3.0000 mL | Freq: Two times a day (BID) | INTRAVENOUS | Status: DC
  Administered 2018-01-08 – 2018-01-09 (×2): 3 mL via INTRAVENOUS

## 2018-01-08 MED ORDER — ENOXAPARIN SODIUM 40 MG/0.4ML ~~LOC~~ SOLN
40.0000 mg | SUBCUTANEOUS | Status: DC
  Administered 2018-01-08: 40 mg via SUBCUTANEOUS
  Filled 2018-01-08 (×2): qty 0.4

## 2018-01-08 MED ORDER — LEVOTHYROXINE SODIUM 75 MCG PO TABS
75.0000 ug | ORAL_TABLET | Freq: Every day | ORAL | Status: DC
  Administered 2018-01-09: 75 ug via ORAL
  Filled 2018-01-08: qty 1

## 2018-01-08 MED ORDER — ONDANSETRON HCL 4 MG PO TABS: 4.0000 mg | ORAL_TABLET | Freq: Every day | ORAL | 0 refills | Status: DC | PRN

## 2018-01-08 MED ORDER — IOPAMIDOL (ISOVUE-370) INJECTION 76%
INTRAVENOUS | Status: AC
  Administered 2018-01-08: 80 mL
  Filled 2018-01-08: qty 100

## 2018-01-08 MED ORDER — ONDANSETRON HCL 4 MG/2ML IJ SOLN
4.0000 mg | Freq: Once | INTRAMUSCULAR | Status: AC
  Administered 2018-01-08: 4 mg via INTRAVENOUS
  Filled 2018-01-08: qty 2

## 2018-01-08 MED ORDER — SODIUM CHLORIDE 0.9 % IV BOLUS (SEPSIS)
500.0000 mL | Freq: Once | INTRAVENOUS | Status: AC
  Administered 2018-01-08: 500 mL via INTRAVENOUS

## 2018-01-08 MED ORDER — ACETAMINOPHEN 650 MG RE SUPP: 650.0000 mg | Freq: Four times a day (QID) | RECTAL | Status: DC | PRN

## 2018-01-08 MED ORDER — ACETAMINOPHEN 325 MG PO TABS: 650.0000 mg | ORAL_TABLET | Freq: Four times a day (QID) | ORAL | Status: DC | PRN

## 2018-01-08 MED ORDER — ENSURE ENLIVE PO LIQD: 237.0000 mL | Freq: Two times a day (BID) | ORAL | Status: DC

## 2018-01-08 MED ORDER — MELOXICAM 7.5 MG PO TABS
7.5000 mg | ORAL_TABLET | Freq: Every day | ORAL | Status: DC
  Administered 2018-01-08 – 2018-01-09 (×2): 7.5 mg via ORAL
  Filled 2018-01-08 (×3): qty 1

## 2018-01-08 MED ORDER — TRAMADOL HCL 50 MG PO TABS
50.0000 mg | ORAL_TABLET | Freq: Four times a day (QID) | ORAL | Status: DC | PRN
  Administered 2018-01-08 – 2018-01-09 (×2): 50 mg via ORAL
  Filled 2018-01-08 (×2): qty 1

## 2018-01-08 MED ORDER — ATORVASTATIN CALCIUM 80 MG PO TABS
80.0000 mg | ORAL_TABLET | Freq: Every day | ORAL | Status: DC
  Administered 2018-01-08 – 2018-01-09 (×2): 80 mg via ORAL
  Filled 2018-01-08 (×2): qty 1

## 2018-01-08 MED ORDER — FENTANYL CITRATE (PF) 100 MCG/2ML IJ SOLN
50.0000 ug | Freq: Once | INTRAMUSCULAR | Status: AC
  Administered 2018-01-08: 50 ug via INTRAVENOUS
  Filled 2018-01-08: qty 2

## 2018-01-08 NOTE — ED Provider Notes (Signed)
[facility] EMERGENCY DEPARTMENT Provider Note   CSN: [id number] Arrival date & time: 01/08/18  1002     History   Chief Complaint Chief Complaint  Patient presents with  . Chest Pain  . Shortness of Breath    HPI Whitney Mayer is a 84 y.o. female.  HPI   Chest Pain 6 age YOF with a history of bronchiectasis, atrial fibrillation (one episode during the hospitalization, and currently anticoagulant with 324 of aspirin twice daily), hypothyroidism, and CVA arrives at the emergency department complaining of chest pain. The pain began 12 hours after she attempted to assist herself to the bathroom. It is described as sharp. It is located in the substernal region region, radiates down the right arm and into the back  and is associated with nausea, and shortness of breath but no lightheadedness or diaphoresis . The pain is intermittent, made worse by movement and deep breathing. 0.4 mg SL nitro did not relieve pain. Pt denies vomiting, diaphoresis, dyspnea, syncope, palpitations, fever, peripheral edema, abdominal pain, cough, or hemoptysis.  Patient denies increasing lower extremity edema.  No hospitalizations or surgeries within the last month.  On EMS arrival to the scene, patient was 80% on room air, and has been on 2.5 L since arriving in the department.  Patient does not use oxygen at home.  Patient is followed by pulmonology for her bronchiectasis, but does not have inhalers.  Patient has no smoking history.  Patient is followed by Dr. Katrinka Blazing in cardiology.  No history of MI.  Patient did have a coronary catheterization in 2017 which revealed 45% stenosis of ost LAD and prox LAD.  Patient also has a history of thoracic aortic aneurysm that was evaluated at last admission in October 2018.  Last echocardiogram demonstrated 60-65% EF with grade 1 diastolic dysfunction.     Past Medical History:  Diagnosis Date  . Hypertension   . Hypothyroidism   . Pneumonia ~ 1943    . Rheumatoid arthritis (HCC)    "all over; hands, feet, knees, joints" (05/31/2016)  . Thyroid disease     Patient Active Problem List   Diagnosis Date Noted  . Viral URI with cough 10/29/2017  . CVA (cerebral vascular accident) (HCC) 09/22/2017  . Nausea 09/04/2017  . Numbness of face 03/19/2017  . Aortic atherosclerosis (HCC) 02/15/2017  . Primary osteoarthritis of both knees 11/29/2016  . Primary osteoarthritis of both hips 11/29/2016  . Bronchiectasis without acute exacerbation (HCC) 11/29/2016  . Pruritus 10/29/2016  . No blood products 09/11/2016  . Hypertension 06/08/2016  . Osteoporosis 06/08/2016  . Hashimoto's thyroiditis 06/01/2016  . Thoracic ascending aortic aneurysm (HCC) 06/01/2016  . Rheumatoid arthritis involving multiple sites (HCC) 06/01/2016  . Coronary artery calcification 06/01/2016    Past Surgical History:  Procedure Laterality Date  . CARDIAC CATHETERIZATION N/A 06/02/2016   Procedure: Left Heart Cath and Coronary Angiography;  Surgeon: Lyn Records, MD;  Location: [facility] INVASIVE CV LAB;  Service: Cardiovascular;  Laterality: N/A;  . CARDIOVERSION N/A 06/02/2016   Procedure: CARDIOVERSION;  Surgeon: Runell Gess, MD;  Location: [facility] OR;  Service: Cardiovascular;  Laterality: N/A;  . DILATION AND CURETTAGE OF UTERUS  1980s  . TUBAL LIGATION  1960s  . VIDEO BRONCHOSCOPY Bilateral 12/07/2016   Procedure: VIDEO BRONCHOSCOPY WITHOUT FLUORO;  Surgeon: Kalman Shan, MD;  Location: [facility] ENDOSCOPY;  Service: Cardiopulmonary;  Laterality: Bilateral;    OB History    No data available       Home  Medications    Prior to Admission medications   Medication Sig Start Date End Date Taking? Authorizing Provider  alendronate (FOSAMAX) 70 MG tablet Take 1 tablet (70 mg total) by mouth once a week. Take with a full glass of water on an empty stomach. Patient taking differently: Take 70 mg by mouth every Friday. Take with a full glass of water on an empty stomach.  02/15/17   Eulah Pont, MD  aspirin 325 MG tablet Take 1 tablet (325 mg total) by mouth 2 (two) times daily. 09/23/17   Lacroce, Ames Coupe, MD  atorvastatin (LIPITOR) 80 MG tablet Take 1 tablet (80 mg total) by mouth daily at 6 PM. 09/23/17   Lacroce, Ames Coupe, MD  benzonatate (TESSALON PERLES) 100 MG capsule Take 1 capsule (100 mg total) by mouth 3 (three) times daily as needed for cough. 10/29/17   Beola Cord, MD  calcium carbonate (OS-CAL - DOSED IN MG OF ELEMENTAL CALCIUM) 1250 (500 Ca) MG tablet Take 1 tablet (500 mg of elemental calcium total) by mouth daily with breakfast. 02/15/17   Eulah Pont, MD  Cholecalciferol 1000 units tablet Take 1 tablet (1,000 Units total) by mouth daily. 02/15/17   Eulah Pont, MD  diltiazem (CARDIZEM CD) 180 MG 24 hr capsule Take 1 capsule (180 mg total) by mouth daily. 09/04/17   Eulah Pont, MD  feeding supplement, ENSURE ENLIVE, (ENSURE ENLIVE) LIQD Take 237 mLs by mouth 2 (two) times daily between meals. Patient taking differently: Take 237 mLs by mouth 3 (three) times a week.  06/04/16   Rivet, Iris Pert, MD  fluticasone (FLONASE) 50 MCG/ACT nasal spray Place 1 spray into both nostrils daily. 10/29/17   Beola Cord, MD  folic acid (FOLVITE) 1 MG tablet Take 1 tablet (1 mg total) by mouth daily. 02/15/17   Eulah Pont, MD  hydrOXYzine (ATARAX/VISTARIL) 25 MG tablet Take 25 mg by mouth 3 (three) times daily. FOR ITCHING 08/27/17   [provider]  ibuprofen (ADVIL,MOTRIN) 200 MG tablet Take 400 mg by mouth 2 (two) times daily as needed (for pain).    [provider]  levocetirizine (XYZAL) 5 MG tablet Take 1 tablet (5 mg total) by mouth every evening. 02/13/17   Eulah Pont, MD  levothyroxine (SYNTHROID, LEVOTHROID) 75 MCG tablet Take 1 tablet (75 mcg total) by mouth daily before breakfast. 09/24/17   Lacroce, Ames Coupe, MD  losartan (COZAAR) 100 MG tablet Take 0.5 tablets (50 mg total) by mouth daily. 02/15/17   Eulah Pont, MD  montelukast  (SINGULAIR) 10 MG tablet Take 10 mg by mouth daily. 08/27/17   [provider]  ondansetron (ZOFRAN) 4 MG tablet Take 1 tablet (4 mg total) by mouth daily as needed for nausea or vomiting. 01/08/18   Eulah Pont, MD  ranitidine (ZANTAC) 150 MG tablet Take 150 mg by mouth every morning. 08/27/17   [provider]  traMADol (ULTRAM) 50 MG tablet Take 1 tablet (50 mg total) by mouth every 6 (six) hours as needed. Patient taking differently: Take 50 mg by mouth every 6 (six) hours as needed (for pain).  02/13/17 02/13/18  Eulah Pont, MD    Family History Family History  Problem Relation Age of Onset  . Stroke Mother   . Kidney disease Brother     Social History Social History   Tobacco Use  . Smoking status: Never Smoker  . Smokeless tobacco: Never Used  Substance Use Topics  . Alcohol use: No  . Drug use: No  Allergies   Patient has no known allergies.   Review of Systems Review of Systems  Constitutional: Negative for chills and fever.  HENT: Positive for rhinorrhea. Negative for congestion.   Respiratory: Positive for chest tightness and shortness of breath. Negative for cough and wheezing.   Cardiovascular: Positive for chest pain. Negative for palpitations and leg swelling.  Gastrointestinal: Positive for nausea. Negative for abdominal pain and vomiting.  Genitourinary: Negative for flank pain.  Musculoskeletal: Positive for back pain.  Neurological: Positive for dizziness and light-headedness.  All other systems reviewed and are negative.    Physical Exam Updated Vital Signs BP 104/67   Pulse 93   Temp (!) 97.5 F (36.4 C) (Oral)   Resp (!) 28   Ht 5\' 2"  (1.575 m)   Wt 52.2 kg (115 lb)   LMP  (LMP Unknown)   SpO2 100%   BMI 21.03 kg/m   Physical Exam  Constitutional: She appears well-developed and well-nourished. No distress.  Tachypneic and requires increased respiratory effort to provide history.  HENT:  Head: Normocephalic and  atraumatic.  Mouth/Throat: Oropharynx is clear and moist.  Eyes: Conjunctivae and EOM are normal. Pupils are equal, round, and reactive to light.  Neck: Normal range of motion. Neck supple.  Cardiovascular: Normal rate, regular rhythm, S1 normal, S2 normal and intact distal pulses.  No murmur heard. Pulses:      Carotid pulses are 2+ on the right side, and 2+ on the left side.      Radial pulses are 2+ on the right side, and 2+ on the left side.       Dorsalis pedis pulses are 1+ on the right side, and 2+ on the left side.  Pulmonary/Chest: Effort normal. She has rales.  Coarse crackles in bilateral lung bases.  Abdominal: Soft. She exhibits no distension. There is no tenderness. There is no guarding.  Musculoskeletal: Normal range of motion. She exhibits no edema or deformity.  Lymphadenopathy:    She has no cervical adenopathy.  Neurological: She is alert.  Cranial nerves grossly intact. Patient moves extremities symmetrically and with good coordination.  Skin: Skin is warm and dry. No rash noted. No erythema.  Psychiatric: She has a normal mood and affect. Her behavior is normal. Judgment and thought content normal.  Nursing note and vitals reviewed.    ED Treatments / Results  Labs (all labs ordered are listed, but only abnormal results are displayed) Labs Reviewed  BASIC METABOLIC PANEL - Abnormal; Notable for the following components:      Result Value   Glucose, Bld 101 (*)    Calcium 8.5 (*)    All other components within normal limits  CBC - Abnormal; Notable for the following components:   WBC 15.6 (*)    Hemoglobin 11.4 (*)    HCT 35.8 (*)    All other components within normal limits  I-STAT TROPONIN, ED    EKG  EKG Interpretation  Date/Time:  Tuesday January 08 2018 10:11:00 EST Ventricular Rate:  97 PR Interval:    QRS Duration: 58 QT Interval:  426 QTC Calculation: 542 R Axis:   16 Text Interpretation:  Sinus rhythm Borderline T abnormalities,  anterior leads Prolonged QT interval when compared to prior, longer QTc.  No STEMI Confirmed by [id number] (Theda Belfast) on 01/08/2018 5:32:52 PM       Radiology Dg Chest 2 View  Result Date: 01/08/2018 CLINICAL DATA:  Chest pain.  Difficulty breathing. EXAM: CHEST  2 VIEW COMPARISON:  CT 02/23/2017. FINDINGS: Mediastinum and hilar structures normal. Heart size normal. Mild bibasilar atelectasis/infiltrates. Small left pleural effusion cannot be excluded. No pneumothorax. No acute bony abnormality. IMPRESSION: Mild bibasilar atelectasis/infiltrates. Small left pleural effusion cannot be excluded. Electronically Signed   By: Maisie Fus  Register   On: 01/08/2018 11:10   Ct Angio Chest Pe W/cm &/or Wo Cm  Result Date: 01/08/2018 CLINICAL DATA:  Chest pain and shortness of breath.  Pneumonia. EXAM: CT ANGIOGRAPHY CHEST WITH CONTRAST TECHNIQUE: Multidetector CT imaging of the chest was performed using the standard protocol during bolus administration of intravenous contrast. Multiplanar CT image reconstructions and MIPs were obtained to evaluate the vascular anatomy. CONTRAST:  <See Chart> ISOVUE-370 IOPAMIDOL (ISOVUE-370) INJECTION 76% COMPARISON:  Two-view chest x-ray 01/08/2018. CTA chest scratched at CT chest 02/23/2017 FINDINGS: Cardiovascular: Heart is mildly enlarged. Coronary artery calcifications are present. There no effusion. Ascending aorta measures 4.2 cm. Pulmonary artery opacification is excellent. No focal filling defects are present. Mediastinum/Nodes: No significant mediastinal, hilar, or axillary adenopathy is present. Lungs/Pleura: Bilateral dependent airspace disease is present. Scarring and atelectasis is present bilaterally. No significant airspace consolidation is present. No pleural or pericardial effusion is present. Upper Abdomen: Limited imaging of the abdomen is unremarkable. Musculoskeletal: A T11 compression fracture is similar the prior exam. No acute fractures are present. AP  alignment is anatomic. No focal lytic or blastic lesions are present. Review of the MIP images confirms the above findings. IMPRESSION: 1. No pulmonary embolus. 2. Mild cardiomegaly without failure. 3. bibasilar airspace disease likely reflects atelectasis or scarring. 4. Remote T11 fracture. Electronically Signed   By: Marin Roberts M.D.   On: 01/08/2018 11:44    Procedures Procedures (including critical care time)  Medications Ordered in ED Medications  ondansetron (ZOFRAN) injection 4 mg (not administered)  sodium chloride 0.9 % bolus 500 mL (not administered)  fentaNYL (SUBLIMAZE) injection 50 mcg (50 mcg Intravenous Given 01/08/18 1114)  iopamidol (ISOVUE-370) 76 % injection (80 mLs  Contrast Given 01/08/18 1119)     Initial Impression / Assessment and Plan / ED Course  I have reviewed the triage vital signs and the nursing notes.  Pertinent labs & imaging results that were available during my care of the patient were reviewed by me and considered in my medical decision making (see chart for details).     Differential diagnosis includes ACS, PE, thoracic aortic dissection, Boerhaave's syndrome, cardiac tamponade, pneumothorax, incarcerated diaphragmatic hernia, cholecystitis, esophageal spasm, gastroesophageal reflux, herpes zoster of the thorax, pericarditis, pneumonia, chest wall pain, costochondritis.  Given pleuritic pain, and new onset oxygen requirement, pulmonary embolism is highest on the differential at this time.  We will also be able to assess size of aorta at this time.  Lower suspicion for thoracic aortic dissection, as initial chest x-ray and x-rays no widened mediastinum, and there is no pulse deficit in bilateral radial pulses.  Initial troponin negative. Initial EKG shows no signs of ischemia, infarction, or arrhythmia, but patient does have prolonged QTc at 542, and HEART score 5. CTPA negative for PE. Thoracic aneurysm stable in size. Patient remained nontoxic  appearing and in no acute distress during emergency department course. Vital signs stable without tachycardia, hypo or hypertension, but patient continued to require O2 in the emergency department and is tachypneic. Doubt esophageal rupture, cardiac tamponade, or pneumothorax. Pericarditis less likely due to no preceding infectious symptoms and pain not improved in upright positions. Abnormal labs include leukocytosis of 15.6. No clear infiltrate on CTPA or other clear  infectious cause for leukocytosis.  Due to risk of MACE, admission is advised at this time.  Case discussed with Dr. Benjiman Core, who independently evaluated the patient.  Spoke with Dr. Nelson Chimes of teaching service, who will admit the patient for CP rule out.  Patient and family understand and are in agreement with plan of care.   Final Clinical Impressions(s) / ED Diagnoses   Final diagnoses:  Central chest pain  Hypoxia    ED Discharge Orders    None       Delia Chimes 01/08/18 1731    Delia Chimes 01/08/18 1733    Benjiman Core, MD 01/11/18 2053

## 2018-01-08 NOTE — ED Notes (Signed)
Pt back from CT

## 2018-01-08 NOTE — ED Notes (Signed)
PA at bedside.

## 2018-01-08 NOTE — ED Notes (Signed)
Spoke with admitting MD about BP 86/62 and received VO for 500cc NS bolus then reassess

## 2018-01-08 NOTE — ED Notes (Signed)
Pt given turkey sandwich and diet sprite 

## 2018-01-08 NOTE — H&P (Signed)
Date: 01/08/2018               Patient Name:  Whitney Mayer MRN: [id number]  DOB: 1932/01/10 Age / Sex: 84 y.o., female   PCP: Whitney Noss, MD         Medical Service: Internal Medicine Teaching Service         Attending Physician: Dr. Oval Linsey, MD    First Contact: Dr. Trilby Drummer Pager: [phone]  Second Contact: Dr. Reesa Chew Pager: [phone]       After Hours (After 5p/  First Contact Pager: [phone]  weekends / holidays): Second Contact Pager: [phone]   Chief Complaint: Chest Pain  History of Present Illness: Whitney Mayer is an 84 yo F with a history of RA, Bronchiectasis, CVA, Ascending Aortic Aneurysm, OA, HTN, and Hashimoto's Thyroiditis who presents with one day of chest pain. History obtained with assistance of her daughters at bedside as [name] is the patient's second language. Patient began feeling pain last night after a couple of transitions to here bedside commode (per daughter). The patient states that she has baseline rheumatoid pain, but the pain last night was worse than her usual pain and made it more difficult for sleep and she tried to relax, but was experiencing chest pain, that felt as if it was "in her bones". The pain was describes as a 10/10, pleuritic right sided chest pain that radiated to he back and R shoulder. The pain was not tender to palpation. Her daughter tried giving her 642m of  at Ibuprofen6am and Aspirin at 7am, neither of which improved the patient's pain. The patient had become short of breath due to the pain associated with her breathing and an ambulance was called to transport here to the ED. Her chest pain is improved with pain medication in the ED (from 10/10 to 4/10) and her shortness of breath is improved as well as she is able to take larger breaths. She denies fever, chills, headache, nausea, diaphoresis, constipation, diarrhea, or change urinary habits.  In the ED, patients vitals significant for 2 desaturations to the 80s (satting well on 2L when  examined) and RR 20s-low 30s. CBC showed WBC 15.6; BMET WNL; iSTAT Troponin negative; BNP 125.6. EKG showed NSR with long QTC. CXR showed Bibasilar atelectasis. CTA showed Bibasilar atelectasis and scarring, no PE. Patient received 1L IVF, Zofran, and 551m of Fentanyl. Patient to be admitted for further workup and care.  Meds:  Current Meds  Medication Sig  . alendronate (FOSAMAX) 70 MG tablet Take 1 tablet (70 mg total) by mouth once a week. Take with a full glass of water on an empty stomach. (Patient taking differently: Take 70 mg by mouth every Friday. Take with a full glass of water on an empty stomach.)  . aspirin 325 MG tablet Take 1 tablet (325 mg total) by mouth 2 (two) times daily.  . Marland Kitchentorvastatin (LIPITOR) 80 MG tablet Take 1 tablet (80 mg total) by mouth daily at 6 PM.  . calcium carbonate (OS-CAL - DOSED IN MG OF ELEMENTAL CALCIUM) 1250 (500 Ca) MG tablet Take 1 tablet (500 mg of elemental calcium total) by mouth daily with breakfast.  . Cholecalciferol 1000 units tablet Take 1 tablet (1,000 Units total) by mouth daily.  . feeding supplement, ENSURE ENLIVE, (ENSURE ENLIVE) LIQD Take 237 mLs by mouth 2 (two) times daily between meals. (Patient taking differently: Take 237 mLs by mouth 3 (three) times a week. )  . fluticasone (FLONASE) 50 MCG/ACT nasal  spray Place 1 spray into both nostrils daily. (Patient taking differently: Place 1 spray into both nostrils daily as needed for allergies. )  . folic acid (FOLVITE) 1 MG tablet Take 1 tablet (1 mg total) by mouth daily.  . hydrOXYzine (ATARAX/VISTARIL) 25 MG tablet Take 25 mg by mouth every 8 (eight) hours as needed for anxiety or itching. FOR ITCHING  . ibuprofen (ADVIL,MOTRIN) 200 MG tablet Take 400 mg by mouth 2 (two) times daily as needed (for pain).  Marland Kitchen levocetirizine (XYZAL) 5 MG tablet Take 1 tablet (5 mg total) by mouth every evening.  Marland Kitchen levothyroxine (SYNTHROID, LEVOTHROID) 100 MCG tablet Take 50 mcg by mouth daily before breakfast.   . losartan (COZAAR) 100 MG tablet Take 0.5 tablets (50 mg total) by mouth daily.  . montelukast (SINGULAIR) 10 MG tablet Take 10 mg by mouth daily.  . ondansetron (ZOFRAN) 4 MG tablet Take 1 tablet (4 mg total) by mouth daily as needed for nausea or vomiting.  . traMADol (ULTRAM) 50 MG tablet Take 1 tablet (50 mg total) by mouth every 6 (six) hours as needed. (Patient taking differently: Take 50 mg by mouth every 6 (six) hours as needed (for pain). )     Allergies: Allergies as of 01/08/2018  . (No Known Allergies)   Past Medical History:  Diagnosis Date  . Hypertension   . Hypothyroidism   . Pneumonia ~ 1943  . Rheumatoid arthritis ([place])    "all over; hands, feet, knees, joints" (05/31/2016)  . Thyroid disease     Family History: Family History  Problem Relation Age of Onset  . Stroke Mother   . Kidney disease Brother   - Reviewed on Admission  Social History: Social History   Tobacco Use  . Smoking status: Never Smoker  . Smokeless tobacco: Never Used  Substance Use Topics  . Alcohol use: No  . Drug use: No  - Confirmed on Admission  Review of Systems: A complete ROS was negative except as per HPI.  Physical Exam: Blood pressure 100/60, pulse 88, temperature (!) 97.5 F (36.4 C), temperature source Oral, resp. rate (!) 27, height 5' 2"  (1.575 m), weight 115 lb (52.2 kg), SpO2 91 %. Physical Exam  Constitutional: She appears well-developed and well-nourished.  Thin, Bed and Wheelchair bound patient  HENT:  Head: Normocephalic and atraumatic.  Eyes: EOM are normal. Right eye exhibits no discharge. Left eye exhibits no discharge.  Cardiovascular: Normal rate, regular rhythm and intact distal pulses.  Distant heart sounds  Pulmonary/Chest: Effort normal.  Bibasilar coarse, dry crackles  Abdominal: Soft. Bowel sounds are normal. She exhibits no distension. There is no tenderness.  Musculoskeletal: She exhibits no edema.  RA joint deformities of bilateral upper  and lower extremities  Neurological: She is alert.  Skin: Skin is warm and dry.    EKG: personally reviewed my interpretation is NSR with long QTC.  CXR: personally reviewed and I agree with: - Mild bibasilar atelectasis/infiltrates. Small left pleural effusion cannot be excluded.  Assessment & Plan by Problem: Whitney Mayer is an 84 yo F with a history of RA, Bronchiectasis, CVA, Ascending Aortic Aneurysm, OA, HTN, and Hashimoto's Thyroiditis who presents with one day of chest pain.  Chest Pain: Patient admitted for chest pain rule out. Normal troponin in the ED, EKG non-ischemic. Non tender to palpation. Pain significantly improved with pain medications in ED. Patient initially SOB, but this improved with ability to breath more deeply with decrease in pain. Pain may be musculoskeletal  in nature though not reproducible on exam, pain did follow 2 episodes of transferring. Possible related to patient's RA, though ribs not affected by RA, should pain may be related to this. Patient takes NSAID's and Tylenol for RA pain at home with Tramadol for Breakthrough pain (although currently out of tramadol). Will check ESR for signs of increased inflammation.  - Trend troponin, if repeat (-) x2, can stop trending - Meloxicam 7.64m Daily - Tramadol [id number], PRN Pain - Cardiac Monitoring - ESR  HTN:  BP softer in ED following Fentanyl administration. - Will hold home losartan  Hypothyroidism: (Hx Hashimoto's Thyroiditis) - Continue home Synthroid 7526mDaily  FEN: Heart diet VTE ppx: Lovenox Code Status: DNR, BiPAP okay   Dispo: Admit patient to Observation with expected length of stay less than 2 midnights.  Signed: MelNeva SeatD 01/08/2018, 3:22 PM  Pager: [phone]

## 2018-01-08 NOTE — ED Triage Notes (Addendum)
Pt arrives EMS from home with generalized chest pain and sob that began ~ 3 hours ago. Denies cough, fevers. Denies diaphoresis, n/v. Pt is stretcher bound d/t arthritis.    # 20 LAC 324 ASA PTA and pt took 0.4 NTG at home PTA of ems with no relief.  80% RA with EMS, placed on 6lpm [place] and increased 93%

## 2018-01-08 NOTE — ED Notes (Signed)
Patient transported to CT 

## 2018-01-08 NOTE — ED Notes (Signed)
Pt placed on RA by admitting and vo to place back on o2 if spo2 drops.

## 2018-01-09 ENCOUNTER — Observation Stay (HOSPITAL_BASED_OUTPATIENT_CLINIC_OR_DEPARTMENT_OTHER): Payer: Medicare HMO

## 2018-01-09 DIAGNOSIS — D72828 Other elevated white blood cell count: Secondary | ICD-10-CM | POA: Diagnosis not present

## 2018-01-09 DIAGNOSIS — I10 Essential (primary) hypertension: Secondary | ICD-10-CM | POA: Diagnosis not present

## 2018-01-09 DIAGNOSIS — I2510 Atherosclerotic heart disease of native coronary artery without angina pectoris: Secondary | ICD-10-CM | POA: Diagnosis not present

## 2018-01-09 DIAGNOSIS — E063 Autoimmune thyroiditis: Secondary | ICD-10-CM

## 2018-01-09 DIAGNOSIS — M069 Rheumatoid arthritis, unspecified: Secondary | ICD-10-CM

## 2018-01-09 DIAGNOSIS — R079 Chest pain, unspecified: Secondary | ICD-10-CM | POA: Diagnosis not present

## 2018-01-09 DIAGNOSIS — Z66 Do not resuscitate: Secondary | ICD-10-CM

## 2018-01-09 DIAGNOSIS — D649 Anemia, unspecified: Secondary | ICD-10-CM

## 2018-01-09 DIAGNOSIS — Z993 Dependence on wheelchair: Secondary | ICD-10-CM

## 2018-01-09 DIAGNOSIS — D72829 Elevated white blood cell count, unspecified: Secondary | ICD-10-CM | POA: Diagnosis not present

## 2018-01-09 DIAGNOSIS — M1990 Unspecified osteoarthritis, unspecified site: Secondary | ICD-10-CM

## 2018-01-09 DIAGNOSIS — Z79899 Other long term (current) drug therapy: Secondary | ICD-10-CM

## 2018-01-09 DIAGNOSIS — Z8673 Personal history of transient ischemic attack (TIA), and cerebral infarction without residual deficits: Secondary | ICD-10-CM

## 2018-01-09 DIAGNOSIS — R072 Precordial pain: Secondary | ICD-10-CM | POA: Diagnosis not present

## 2018-01-09 DIAGNOSIS — Z79890 Hormone replacement therapy (postmenopausal): Secondary | ICD-10-CM

## 2018-01-09 DIAGNOSIS — I712 Thoracic aortic aneurysm, without rupture: Secondary | ICD-10-CM | POA: Diagnosis not present

## 2018-01-09 DIAGNOSIS — R0789 Other chest pain: Secondary | ICD-10-CM | POA: Diagnosis not present

## 2018-01-09 DIAGNOSIS — J9811 Atelectasis: Secondary | ICD-10-CM

## 2018-01-09 DIAGNOSIS — J479 Bronchiectasis, uncomplicated: Secondary | ICD-10-CM

## 2018-01-09 DIAGNOSIS — I351 Nonrheumatic aortic (valve) insufficiency: Secondary | ICD-10-CM

## 2018-01-09 DIAGNOSIS — Z79891 Long term (current) use of opiate analgesic: Secondary | ICD-10-CM

## 2018-01-09 DIAGNOSIS — R071 Chest pain on breathing: Secondary | ICD-10-CM

## 2018-01-09 DIAGNOSIS — Z791 Long term (current) use of non-steroidal anti-inflammatories (NSAID): Secondary | ICD-10-CM

## 2018-01-09 LAB — ECHOCARDIOGRAM COMPLETE
Area-P 1/2: 1.12 cm2
E decel time: 669 msec
E/e' ratio: 14.2
FS: 34 % (ref 28–44)
Height: 62 in
IVS/LV PW RATIO, ED: 1.2
LA ID, A-P, ES: 36 mm
LA diam end sys: 36 mm
LA diam index: 2.37 cm/m2
LA vol A4C: 63.3 ml
LA vol index: 34.7 mL/m2
LA vol: 52.8 mL
LV E/e' medial: 14.2
LV E/e'average: 14.2
LV PW d: 13.3 mm — AB (ref 0.6–1.1)
LV e' LATERAL: 5.22 cm/s
LVOT SV: 48 mL
LVOT VTI: 15.2 cm
LVOT area: 3.14 cm2
LVOT diameter: 20 mm
LVOT peak grad rest: 4 mmHg
LVOT peak vel: 97.6 cm/s
Lateral S' vel: 10.9 cm/s
MV Dec: 669
MV Peak grad: 2 mmHg
MV pk A vel: 90.6 m/s
MV pk E vel: 74.1 m/s
P 1/2 time: 196 ms
RV sys press: 22 mmHg
Reg peak vel: 216 cm/s
TAPSE: 10.4 mm
TDI e' lateral: 5.22
TR max vel: 216 cm/s
Weight: 1862.45 oz

## 2018-01-09 LAB — URINALYSIS, ROUTINE W REFLEX MICROSCOPIC
BACTERIA UA: NONE SEEN
BILIRUBIN URINE: NEGATIVE
Glucose, UA: NEGATIVE mg/dL
Hgb urine dipstick: NEGATIVE
KETONES UR: NEGATIVE mg/dL
Nitrite: NEGATIVE
PROTEIN: 30 mg/dL — AB
Specific Gravity, Urine: 1.039 — ABNORMAL HIGH (ref 1.005–1.030)
pH: 5 (ref 5.0–8.0)

## 2018-01-09 LAB — INFLUENZA PANEL BY PCR (TYPE A & B)
Influenza A By PCR: NEGATIVE
Influenza B By PCR: NEGATIVE

## 2018-01-09 LAB — CBC
HCT: 31.5 % — ABNORMAL LOW (ref 36.0–46.0)
Hemoglobin: 9.9 g/dL — ABNORMAL LOW (ref 12.0–15.0)
MCH: 25.9 pg — ABNORMAL LOW (ref 26.0–34.0)
MCHC: 31.4 g/dL (ref 30.0–36.0)
MCV: 82.5 fL (ref 78.0–100.0)
Platelets: 314 10*3/uL (ref 150–400)
RBC: 3.82 MIL/uL — ABNORMAL LOW (ref 3.87–5.11)
RDW: 15.8 % — ABNORMAL HIGH (ref 11.5–15.5)
WBC: 15.1 10*3/uL — ABNORMAL HIGH (ref 4.0–10.5)

## 2018-01-09 LAB — GLUCOSE, CAPILLARY: Glucose-Capillary: 96 mg/dL (ref 65–99)

## 2018-01-09 LAB — TROPONIN I

## 2018-01-09 MED ORDER — DICLOFENAC SODIUM 1 % TD GEL
2.0000 g | Freq: Four times a day (QID) | TRANSDERMAL | Status: DC | PRN
  Administered 2018-01-09: 16:00:00 2 g via TOPICAL
  Filled 2018-01-09 (×2): qty 100

## 2018-01-09 MED ORDER — SODIUM CHLORIDE 0.9 % IV BOLUS (SEPSIS)
1000.0000 mL | Freq: Once | INTRAVENOUS | Status: AC
  Administered 2018-01-09: 1000 mL via INTRAVENOUS

## 2018-01-09 MED ORDER — MELOXICAM 7.5 MG PO TABS: 7.5000 mg | ORAL_TABLET | Freq: Every day | ORAL | 0 refills | Status: DC

## 2018-01-09 MED ORDER — DICLOFENAC SODIUM 1 % TD GEL: 2.0000 g | Freq: Four times a day (QID) | TRANSDERMAL | 0 refills | Status: DC | PRN

## 2018-01-09 NOTE — Discharge Summary (Signed)
Name: Whitney Mayer MRN: [id number] DOB: August 30, 1932 84 y.o. PCP: Eulah Pont, MD  Date of Admission: 01/08/2018 10:02 AM Date of Discharge:  Attending Physician: Doneen Poisson, MD  Discharge Diagnosis:  Active Problems:   Central chest pain   Leucocytosis   CAD in native artery   Pain of anterior chest wall with respiration   Rheumatoid arthritis involving both hands [facility])   Discharge Medications: Allergies as of 01/09/2018   No Known Allergies     Medication List    STOP taking these medications   benzonatate 100 MG capsule Commonly known as:  TESSALON PERLES   diltiazem 180 MG 24 hr capsule Commonly known as:  CARTIA XT   losartan 100 MG tablet Commonly known as:  COZAAR     TAKE these medications   alendronate 70 MG tablet Commonly known as:  FOSAMAX Take 1 tablet (70 mg total) by mouth once a week. Take with a full glass of water on an empty stomach. What changed:    when to take this  additional instructions   aspirin 325 MG tablet Take 1 tablet (325 mg total) by mouth 2 (two) times daily.   atorvastatin 80 MG tablet Commonly known as:  LIPITOR Take 1 tablet (80 mg total) by mouth daily at 6 PM.   calcium carbonate 1250 (500 Ca) MG tablet Commonly known as:  OS-CAL - dosed in mg of elemental calcium Take 1 tablet (500 mg of elemental calcium total) by mouth daily with breakfast.   Cholecalciferol 1000 units tablet Take 1 tablet (1,000 Units total) by mouth daily.   diclofenac sodium 1 % Gel Commonly known as:  VOLTAREN Apply 2 g topically 4 (four) times daily as needed (Moderate pain).   feeding supplement (ENSURE ENLIVE) Liqd Take 237 mLs by mouth 2 (two) times daily between meals. What changed:  when to take this   fluticasone 50 MCG/ACT nasal spray Commonly known as:  FLONASE Place 1 spray into both nostrils daily. What changed:    when to take this  reasons to take this   folic acid 1 MG tablet Commonly known as:  FOLVITE Take  1 tablet (1 mg total) by mouth daily.   hydrOXYzine 25 MG tablet Commonly known as:  ATARAX/VISTARIL Take 25 mg by mouth every 8 (eight) hours as needed for anxiety or itching. FOR ITCHING   ibuprofen 200 MG tablet Commonly known as:  ADVIL,MOTRIN Take 400 mg by mouth 2 (two) times daily as needed (for pain).   levocetirizine 5 MG tablet Commonly known as:  XYZAL Take 1 tablet (5 mg total) by mouth every evening.   levothyroxine 100 MCG tablet Commonly known as:  SYNTHROID, LEVOTHROID Take 50 mcg by mouth daily before breakfast. What changed:  Another medication with the same name was removed. Continue taking this medication, and follow the directions you see here.   meloxicam 7.5 MG tablet Commonly known as:  MOBIC Take 1 tablet (7.5 mg total) by mouth daily with breakfast. Start taking on:  01/10/2018   montelukast 10 MG tablet Commonly known as:  SINGULAIR Take 10 mg by mouth daily.   ondansetron 4 MG tablet Commonly known as:  ZOFRAN Take 1 tablet (4 mg total) by mouth daily as needed for nausea or vomiting.   traMADol 50 MG tablet Commonly known as:  ULTRAM Take 1 tablet (50 mg total) by mouth every 6 (six) hours as needed. What changed:  reasons to take this  Disposition and follow-up:   Whitney Mayer was discharged from [facility] in Stable condition.  At the hospital follow up visit please address:  1. Chest Pain: Evaluate for improvement resolution. Discharged with Meloxicam.  2. Blood Pressure: Hx of HTN, but soft blood pressure while admitted. Lisinopril Held at discharge. Evaluate if patient needs to restart this medication.  3. Leukocytosis: Patient with WBC of ~15 without explanation (no signs of respiratory infection, U/A trace leuks & no bacteria) Possibly reactive leukocytosis. Recheck CBC for resolution.  4. Anemia: Patient with anemia at baseline. Hgb droped 1.5 on labs but was receiving some IV hydration. Confirm stable  Hgb on repeat CBC.  5. Other: Home PT ordered, ensure this has been arranged.  6.  Labs / imaging needed at time of follow-up: CBC  7.  Pending labs/ test needing follow-up: None  Follow-up Appointments: Follow-up Information    [facility] Follow [facility].   Specialty:  Home Health Services Why:  Home Health Physical Therapy-agency will call with initial visit Contact information: [address] [place] [address] [phone]           Hospital Course by problem list: Patient presented withone day of chest pain and associated shortness of breath. Pain began after transferring to her bedside commode a couple of times (in a new way, that involved using her upper body more) and was described as feeling as though it was "in her bones". Dyspnea was due to being unable to take a deep breath as this worsened her pain. In the ED, CTA was performed and was negative for PE, but did show atelectasis/scarring. She was admitted for chest pain rule out. EKG nonischemic, Troponin negative x3. Patient's pain improved with NSAIDs overnight and upon repeat exam pain was reproducible to palpation of inferior sternum and xiphoid. Paitent's SOB improved with pain control and she was saturating well on room air. Cardiology was auto consulted 2/2 to chest pain rule out and ordered an Echo (Moderate LVH, EF 60% to 65%, grade 1 diastolic dysfunction). Patient discharged with 5 days of Meloxicam 7.5 Daily and Voltaren gel.   Discharge Vitals:   BP 93/68   Pulse 76   Temp 99 F (37.2 C) (Oral)   Resp (!) 24   Ht 5\' 2"  (1.575 m)   Wt 116 lb 6.5 oz (52.8 kg)   LMP  (LMP Unknown)   SpO2 96%   BMI 21.29 kg/m   Pertinent Labs, Studies, and Procedures:  CBC Latest Ref Rng & Units 01/09/2018 01/08/2018 01/08/2018  WBC 4.0 - 10.5 K/uL 15.1(H) 19.5(H) 15.6(H)  Hemoglobin 12.0 - 15.0 g/dL 9.1(Y) 10.5(L) 11.4(L)  Hematocrit 36.0 - 46.0 % 31.5(L) 33.7(L) 35.8(L)  Platelets 150 - 400 K/uL  314 313 367   BMP Latest Ref Rng & Units 01/08/2018 01/08/2018 09/22/2017  Glucose 65 - 99 mg/dL - 782(N) 82  BUN 6 - 20 mg/dL - 9 56(O)  Creatinine 1.30 - 1.00 mg/dL 8.65 7.84 6.96  BUN/Creat Ratio 12 - 28 - - -  Sodium 135 - 145 mmol/L - 136 139  Potassium 3.5 - 5.1 mmol/L - 3.6 4.2  Chloride 101 - 111 mmol/L - 103 107  CO2 22 - 32 mmol/L - 22 26  Calcium 8.9 - 10.3 mg/dL - 8.5(L) 8.6(L)   Troponin: negative x3  EKG: Sinus rhythm Borderline T abnormalities, anterior leads Prolonged QT interval  DG Chest: IMPRESSION: Mild bibasilar atelectasis/infiltrates. Small left pleural effusion cannot be excluded.  CT Angiogram Chest: IMPRESSION: 1. No pulmonary embolus. 2. Mild cardiomegaly without failure. 3. bibasilar airspace disease likely reflects atelectasis or scarring. 4. Remote T11 fracture.  U/A:  Ref Range & Units         (01/09/18)  Color, Urine YELLOW AMBER Abnormal    APPearance CLEAR HAZY Abnormal    Specific Gravity, Urine 1.005 - 1.030 1.039 Abnormally high    pH 5.0 - 8.0 5.0   Glucose, UA NEGATIVE mg/dL NEGATIVE   Hgb urine dipstick NEGATIVE NEGATIVE   Bilirubin Urine NEGATIVE NEGATIVE   Ketones, ur NEGATIVE mg/dL NEGATIVE   Protein, ur NEGATIVE mg/dL 30 Abnormal    Nitrite NEGATIVE NEGATIVE   Leukocytes, UA NEGATIVE TRACE Abnormal    RBC / HPF 0 - 5 RBC/hpf 0-5   WBC, UA 0 - 5 WBC/hpf 6-30   Bacteria, UA NONE SEEN NONE SEEN   Squamous Epithelial / LPF NONE SEEN 0-5 Abnormal      Discharge Instructions: Discharge Instructions    Call MD for:  difficulty breathing, headache or visual disturbances   Complete by:  As directed    Call MD for:  persistant dizziness or light-headedness   Complete by:  As directed    Call MD for:  persistant nausea and vomiting   Complete by:  As directed    Call MD for:  severe uncontrolled pain   Complete by:  As directed    Call MD for:  temperature >100.4   Complete by:  As directed    Diet - low sodium heart healthy    Complete by:  As directed    Discharge instructions   Complete by:  As directed    Thank you for allowing [place] to care for you.  Your pain has been determined not to be caused by your heart. The pain is likely musculoskeletal in nature. - Continue with current pain regimen for RA - We have prescribed a 5 day course of Mobic (a strong NSAID) to help with the pain - We have prescribed Voltaren gel to be applied as needed to the affected are up to 4 times a day - We have ordered home health PT to come to help with strength and transferring  Your blood pressure remained low during this admission. - We have stopped your losartan, your PCP will evaluate your continued need for this medication at your follow up visit  We obtained a urinalysis for elevated white blood cell count. - You will be called if this test is abnormal - We will have your PCP recheck your white blood cell count at your follow up visit  We will also have your hemoglobin rechecked at your follow up visit, to ensure it remains normal.  Please follow up with PCP in the next week  Call your PCP office or on Call resident if you experience a return of concerning symptoms.   Increase activity slowly   Complete by:  As directed       Signed: Beola Cord, MD 01/09/2018, 9:07 PM   Pager: [phone]

## 2018-01-09 NOTE — Progress Notes (Signed)
Initial Nutrition Assessment  DOCUMENTATION CODES:   Not applicable  INTERVENTION:  - Diet advancement as medically feasible. - Will order Magic Cup once/day with dinner, this supplement provides 290 kcal and 9 grams of protein. - Encourage PO intakes.  NUTRITION DIAGNOSIS:   Inadequate oral intake related to inability to eat as evidenced by NPO status.  GOAL:   Patient will meet greater than or equal to 90% of their needs  MONITOR:   Diet advancement, PO intake, Supplement acceptance, Weight trends, Labs  REASON FOR ASSESSMENT:   Malnutrition Screening Tool  ASSESSMENT:   84 yo F with a history of RA, Bronchiectasis, CVA, Ascending Aortic Aneurysm, OA, HTN, and Hashimoto's Thyroiditis who presents with one day of chest pain. English is patient's second language with Spanish being her first language.   BMI indicates normal weight. Pt was on Heart Healthy diet until 8:00 AM today when diet was changed to NPO for cath. Cardiology PA note from this AM states that pt is "now acutely anemic and is a Jehovah's Witness...making her high risk for invasive ischemic testing."   Pt recently had a stroke and she denies chewing or swallowing issues related to this and is able to self-feed. She denies any abdominal pain or nausea with PO intakes or currently. She denies any changes in appetite or in taste recently. Physical assessment completed. Per review, weight has been stable since 06/01/16.  Medications reviewed; 75 mcg oral Synthroid/day.  Labs reviewed; CBG: 96 mg/dL this AM, Ca: 8.5 mg/dL, Hgb 9.9 g/dL.    NUTRITION - FOCUSED PHYSICAL EXAM:  Completed/assessed with no muscle and no fat wasting noted.   Diet Order:  Diet NPO time specified  EDUCATION NEEDS:   No education needs have been identified at this time  Skin:  Skin Assessment: Reviewed RN Assessment  Last BM:  2/11  Height:   Ht Readings from Last 1 Encounters:  01/08/18 5\' 2"  (1.575 m)    Weight:   Wt  Readings from Last 1 Encounters:  01/09/18 116 lb 6.5 oz (52.8 kg)    Ideal Body Weight:  50 kg  BMI:  Body mass index is 21.29 kg/m.  Estimated Nutritional Needs:   Kcal:  1320-1530  Protein:  53-63 grams  Fluid:  >/= 1.5 L/day      01/11/18, MS, RD, LDN, [facility] Inpatient Clinical Dietitian Pager # [phone] After hours/weekend pager # [phone]

## 2018-01-09 NOTE — Care Management Obs Status (Signed)
MEDICARE OBSERVATION STATUS NOTIFICATION   Patient Details  Name: Whitney Mayer MRN: [id number] Date of Birth: 1932-05-06   Medicare Observation Status Notification Given:  Yes    Elliot Cousin, RN 01/09/2018, 4:26 PM

## 2018-01-09 NOTE — Consult Note (Signed)
Cardiology Consultation:   Patient ID: Whitney Mayer; [id number]; 01/31/1932   Admit date: 01/08/2018 Date of Consult: 01/09/2018  Primary Care Provider: Eulah Pont, MD Primary Cardiologist: Nanetta Batty, MD  Primary Electrophysiologist:  Dr. Elberta Fortis    Patient Profile:   Whitney Mayer is a 84 y.o. female with a hx of moderate CAD by heart cath 2017 complicated by paroxysmal Afib (previously on eliquis) and hypotension requiring levophed, RA, HLD, HTN, bronchiectasis, and thoracic ascending aortic aneurysm who is being seen today for the evaluation of chest pain at the request of Dr. Josem Kaufmann.  History of Present Illness:   Whitney Mayer is known to this service. She was seen in consult in 2017 for chest pain. She had a positive stress test and underwent left heart catheterization on 06/02/16. Heart catheterization showed mild nonobstructive disease; her chest pain was presumed to be not ischemic and stress test a false positive. The heart cath was complicated by conversion to Afib. She received IV lopressor and diltiazem followed by dilt drip. She became hypotensive requiring levophed. She converted to NSR following amiodarone bolus. She was discharged on medical therapy and eliquis.  She was seen by Dr. Elberta Fortis (EP) in follow up. 48-hr Holter monitor showed no Afib and she was taken off eliquis.   She was recently hospitalized with stroke. The stroke was not felt to be cardioembolic, but likely due to small vessel disease. She was discharged 09/23/17 on ASA 325 mg BID and high intensity statin.   She immigrated here from [place] (2017), family at bedside helping to translate. She was brought to [facility] via EMS for chest pain. The chest pain started when she transferred a different way from the bed to the bedside commode. She is largely immobile due to her RA and relies on family members to help her transfer from bed to chair/commode. She states the pain has been fairly constant since this transfer  on Monday. The pain is worse with deep inspiration, does not radiate, and has no associated symptoms. Transferring from bed to chair and back is the most strenuous thing she does. Her chest pain has been relieved with narcotics and mobic. She denies bleeding problems, palpitations, dizziness, syncope, recent illness, and lower extremity edema. She has a history of aortic aneurysm, last measured 4.2 cm.  On admission, troponins have been negative, EKG with TWI in inferior and precordial leads unchanged from previous. CXR with mild bibasilar atelectasis. CTA with stable ascending aorta at 4.2 cm, no PE, and negative for pulmonary congestion.   She has been hypotensive, requiring fluid boluses. She is slightly anemic with Hb 9.9 today, from 11.4 on admission. She also has leukocytosis with WBC 15.1, afebrile.  Past Medical History:  Diagnosis Date  . High cholesterol   . Hypertension   . Hypothyroidism   . Pneumonia ~ 1943  . Refusal of blood transfusions as patient is Jehovah's Witness   . Rheumatoid arthritis (HCC)    "all over; hands, feet, knees, joints" (01/08/2018)  . Thyroid disease     Past Surgical History:  Procedure Laterality Date  . CARDIAC CATHETERIZATION N/A 06/02/2016   Procedure: Left Heart Cath and Coronary Angiography;  Surgeon: Lyn Records, MD;  Location: [facility] INVASIVE CV LAB;  Service: Cardiovascular;  Laterality: N/A;  . CARDIOVERSION N/A 06/02/2016   Procedure: CARDIOVERSION;  Surgeon: Runell Gess, MD;  Location: [facility] OR;  Service: Cardiovascular;  Laterality: N/A;  . CATARACT EXTRACTION W/ INTRAOCULAR LENS IMPLANT Left   . DILATION AND  CURETTAGE OF UTERUS  1980s  . TUBAL LIGATION  1960s  . VIDEO BRONCHOSCOPY Bilateral 12/07/2016   Procedure: VIDEO BRONCHOSCOPY WITHOUT FLUORO;  Surgeon: Kalman Shan, MD;  Location: [facility] ENDOSCOPY;  Service: Cardiopulmonary;  Laterality: Bilateral;     Home Medications:  Prior to Admission medications   Medication Sig Start Date End  Date Taking? Authorizing Provider  alendronate (FOSAMAX) 70 MG tablet Take 1 tablet (70 mg total) by mouth once a week. Take with a full glass of water on an empty stomach. Patient taking differently: Take 70 mg by mouth every Friday. Take with a full glass of water on an empty stomach. 02/15/17  Yes Eulah Pont, MD  aspirin 325 MG tablet Take 1 tablet (325 mg total) by mouth 2 (two) times daily. 09/23/17  Yes Lacroce, Ames Coupe, MD  atorvastatin (LIPITOR) 80 MG tablet Take 1 tablet (80 mg total) by mouth daily at 6 PM. 09/23/17  Yes Lacroce, Ames Coupe, MD  calcium carbonate (OS-CAL - DOSED IN MG OF ELEMENTAL CALCIUM) 1250 (500 Ca) MG tablet Take 1 tablet (500 mg of elemental calcium total) by mouth daily with breakfast. 02/15/17  Yes Eulah Pont, MD  Cholecalciferol 1000 units tablet Take 1 tablet (1,000 Units total) by mouth daily. 02/15/17  Yes Eulah Pont, MD  feeding supplement, ENSURE ENLIVE, (ENSURE ENLIVE) LIQD Take 237 mLs by mouth 2 (two) times daily between meals. Patient taking differently: Take 237 mLs by mouth 3 (three) times a week.  06/04/16  Yes Rivet, Iris Pert, MD  fluticasone (FLONASE) 50 MCG/ACT nasal spray Place 1 spray into both nostrils daily. Patient taking differently: Place 1 spray into both nostrils daily as needed for allergies.  10/29/17  Yes Beola Cord, MD  folic acid (FOLVITE) 1 MG tablet Take 1 tablet (1 mg total) by mouth daily. 02/15/17  Yes Eulah Pont, MD  hydrOXYzine (ATARAX/VISTARIL) 25 MG tablet Take 25 mg by mouth every 8 (eight) hours as needed for anxiety or itching. FOR ITCHING 08/27/17  Yes [provider]  ibuprofen (ADVIL,MOTRIN) 200 MG tablet Take 400 mg by mouth 2 (two) times daily as needed (for pain).   Yes [provider]  levocetirizine (XYZAL) 5 MG tablet Take 1 tablet (5 mg total) by mouth every evening. 02/13/17  Yes Eulah Pont, MD  levothyroxine (SYNTHROID, LEVOTHROID) 100 MCG tablet Take 50 mcg by mouth daily before breakfast.   Yes  [provider]  losartan (COZAAR) 100 MG tablet Take 0.5 tablets (50 mg total) by mouth daily. 02/15/17  Yes Eulah Pont, MD  montelukast (SINGULAIR) 10 MG tablet Take 10 mg by mouth daily. 08/27/17  Yes [provider]  ondansetron (ZOFRAN) 4 MG tablet Take 1 tablet (4 mg total) by mouth daily as needed for nausea or vomiting. 01/08/18  Yes Eulah Pont, MD  traMADol (ULTRAM) 50 MG tablet Take 1 tablet (50 mg total) by mouth every 6 (six) hours as needed. Patient taking differently: Take 50 mg by mouth every 6 (six) hours as needed (for pain).  02/13/17 02/13/18 Yes Eulah Pont, MD  benzonatate (TESSALON PERLES) 100 MG capsule Take 1 capsule (100 mg total) by mouth 3 (three) times daily as needed for cough. Patient not taking: Reported on 01/08/2018 10/29/17   Beola Cord, MD  diltiazem (CARDIZEM CD) 180 MG 24 hr capsule Take 1 capsule (180 mg total) by mouth daily. Patient not taking: Reported on 01/08/2018 09/04/17   Eulah Pont, MD  levothyroxine (SYNTHROID, LEVOTHROID) 75 MCG tablet Take 1  tablet (75 mcg total) by mouth daily before breakfast. Patient not taking: Reported on 01/08/2018 09/24/17   Toney Rakes, MD    Inpatient Medications: Scheduled Meds: . atorvastatin  80 mg Oral q1800  . enoxaparin (LOVENOX) injection  40 mg Subcutaneous Q24H  . feeding supplement (ENSURE ENLIVE)  237 mL Oral BID BM  . levothyroxine  75 mcg Oral QAC breakfast  . meloxicam  7.5 mg Oral Q breakfast  . montelukast  10 mg Oral Daily  . sodium chloride flush  3 mL Intravenous Q12H   Continuous Infusions:  PRN Meds: acetaminophen **OR** acetaminophen, traMADol  Allergies:   No Known Allergies  Social History:   Social History   Socioeconomic History  . Marital status: Divorced    Spouse name: Not on file  . Number of children: Not on file  . Years of education: Not on file  . Highest education level: Not on file  Social Needs  . Financial resource strain: Not on file  .  Food insecurity - worry: Not on file  . Food insecurity - inability: Not on file  . Transportation needs - medical: Not on file  . Transportation needs - non-medical: Not on file  Occupational History  . Not on file  Tobacco Use  . Smoking status: Never Smoker  . Smokeless tobacco: Never Used  Substance and Sexual Activity  . Alcohol use: No  . Drug use: No  . Sexual activity: Not on file  Other Topics Concern  . Not on file  Social History Narrative  . Not on file    Family History:    Family History  Problem Relation Age of Onset  . Stroke Mother   . Kidney disease Brother      ROS:  Please see the history of present illness.   All other ROS reviewed and negative.     Physical Exam/Data:   Vitals:   01/09/18 0115 01/09/18 0616 01/09/18 0617 01/09/18 0743  BP: (!) 86/55 106/62 106/62 (!) 97/56  Pulse: 85 85 84 93  Resp: (!) 22 (!) 31 (!) 33 (!) 21  Temp:   98 F (36.7 C) 99 F (37.2 C)  TempSrc:   Oral Oral  SpO2: 93% (!) 89% 92% 90%  Weight:   116 lb 6.5 oz (52.8 kg)   Height:        Intake/Output Summary (Last 24 hours) at 01/09/2018 0800 Last data filed at 01/09/2018 0500 Gross per 24 hour  Intake 1873 ml  Output -  Net 1873 ml   Filed Weights   01/08/18 1021 01/08/18 1752 01/09/18 0617  Weight: 115 lb (52.2 kg) 117 lb 11.6 oz (53.4 kg) 116 lb 6.5 oz (52.8 kg)   Body mass index is 21.29 kg/m.  General:  Well nourished, well developed, in no acute distress HEENT: normal Neck: no JVD Vascular: No carotid bruits Cardiac:  normal S1, S2; RRR; no murmur Lungs:  clear to auscultation bilaterally, diminished in bases, respirations shallow Abd: soft, nontender, no hepatomegaly  Ext: no edema, knees permanently bent 2/ RA Musculoskeletal:  Deformities in hands and knees 2/RA Skin: warm and dry  Neuro:  CNs 2-12 intact, no focal abnormalities noted Psych:  Normal affect   EKG:  The EKG was personally reviewed and demonstrates:  Sinus, TWI in inferior  and precordial leads, improved from previous Telemetry:  Telemetry was personally reviewed and demonstrates:  sinus  Relevant CV Studies:  Holter 08/02/16: No afib, D/C anticoagulation   Left  heart cath 06/02/16: 1. Ost LAD to Prox LAD lesion, 45% stenosed. 2. Ramus lesion, 30% stenosed. 3. RPDA lesion, 30% stenosed. 4. Prox RCA to Dist RCA lesion, 25% stenosed.    Widely patent coronary arteries with nonobstructive three-vessel atherosclerosis.  Ostial 40-50% LAD.  False positive myocardial perfusion imaging and chest pain is felt to be nonischemic.  Overall normal LV function with EF 50-55%  Intraprocedural development of atrial fibrillation with rapid ventricular response. It occurred while attempting to engage the left coronary.  RECOMMENDATIONS:   Resume IV heparin because of atrial fibrillation.  Metoprolol 5 mg given IV and the cath lab.  Diltiazem 10 mg bolus followed by 5 mg per minute infusion.  DO NOT RESUSCITATE was rescinded during the catheterization procedure. The DO NOT RESUSCITATE has now been reinstituted.   Echo 06/02/16: Study Conclusions - Left ventricle: The cavity size was normal. There was moderate   focal basal hypertrophy. Systolic function was normal. The estimated ejection fraction was in the range of 60% to 65%. Wall motion was normal; there were no regional wall motion abnormalities. - Aortic valve: There was mild regurgitation. - Mitral valve: There was trivial regurgitation. - Right ventricle: Systolic function was mildly reduced. - Pulmonary arteries: PA peak pressure: 37 mm Hg (S). - Pericardium, extracardiac: A small, free-flowing pericardial effusion was identified circumferential to the heart. The fluid had no internal echoes.  Impressions: - The right ventricular systolic pressure was increased consistent with mild pulmonary hypertension.   Laboratory Data:  Chemistry Recent Labs  Lab 01/08/18 1013 01/08/18 1516  NA 136  --    K 3.6  --   CL 103  --   CO2 22  --   GLUCOSE 101*  --   BUN 9  --   CREATININE 0.54 0.63  CALCIUM 8.5*  --   GFRNONAA >60 >60  GFRAA >60 >60  ANIONGAP 11  --     No results for input(s): PROT, ALBUMIN, AST, ALT, ALKPHOS, BILITOT in the last 168 hours. Hematology Recent Labs  Lab 01/08/18 1013 01/08/18 1516 01/09/18 0231  WBC 15.6* 19.5* 15.1*  RBC 4.35 4.10 3.82*  HGB 11.4* 10.5* 9.9*  HCT 35.8* 33.7* 31.5*  MCV 82.3 82.2 82.5  MCH 26.2 25.6* 25.9*  MCHC 31.8 31.2 31.4  RDW 15.5 15.6* 15.8*  PLT 367 313 314   Cardiac Enzymes Recent Labs  Lab 01/08/18 1516 01/08/18 1933 01/09/18 0231  TROPONINI <0.03 <0.03 <0.03    Recent Labs  Lab 01/08/18 1031  TROPIPOC 0.00    BNP Recent Labs  Lab 01/08/18 1013  BNP 125.6*    DDimer No results for input(s): DDIMER in the last 168 hours.  Radiology/Studies:  Dg Chest 2 View  Result Date: 01/08/2018 CLINICAL DATA:  Chest pain.  Difficulty breathing. EXAM: CHEST  2 VIEW COMPARISON:  CT 02/23/2017. FINDINGS: Mediastinum and hilar structures normal. Heart size normal. Mild bibasilar atelectasis/infiltrates. Small left pleural effusion cannot be excluded. No pneumothorax. No acute bony abnormality. IMPRESSION: Mild bibasilar atelectasis/infiltrates. Small left pleural effusion cannot be excluded. Electronically Signed   By: Maisie Fus  Register   On: 01/08/2018 11:10   Ct Angio Chest Pe W/cm &/or Wo Cm  Result Date: 01/08/2018 CLINICAL DATA:  Chest pain and shortness of breath.  Pneumonia. EXAM: CT ANGIOGRAPHY CHEST WITH CONTRAST TECHNIQUE: Multidetector CT imaging of the chest was performed using the standard protocol during bolus administration of intravenous contrast. Multiplanar CT image reconstructions and MIPs were obtained to evaluate the vascular  anatomy. CONTRAST:  <See Chart> ISOVUE-370 IOPAMIDOL (ISOVUE-370) INJECTION 76% COMPARISON:  Two-view chest x-ray 01/08/2018. CTA chest scratched at CT chest 02/23/2017 FINDINGS:  Cardiovascular: Heart is mildly enlarged. Coronary artery calcifications are present. There no effusion. Ascending aorta measures 4.2 cm. Pulmonary artery opacification is excellent. No focal filling defects are present. Mediastinum/Nodes: No significant mediastinal, hilar, or axillary adenopathy is present. Lungs/Pleura: Bilateral dependent airspace disease is present. Scarring and atelectasis is present bilaterally. No significant airspace consolidation is present. No pleural or pericardial effusion is present. Upper Abdomen: Limited imaging of the abdomen is unremarkable. Musculoskeletal: A T11 compression fracture is similar the prior exam. No acute fractures are present. AP alignment is anatomic. No focal lytic or blastic lesions are present. Review of the MIP images confirms the above findings. IMPRESSION: 1. No pulmonary embolus. 2. Mild cardiomegaly without failure. 3. bibasilar airspace disease likely reflects atelectasis or scarring. 4. Remote T11 fracture. Electronically Signed   By: Marin Roberts M.D.   On: 01/08/2018 11:44    Assessment and Plan:   1. Chest pain, moderate nonobstructive disease by cath 06/02/16, echo with normal EF - troponin negative - EKG unchanged from previous She describes onset of chest pain after transferring differently from the bed to the bedside commode. While this would be considered exertional given her current mobility status, the chest pain is pleuritic and sounds MSK in origin. The chest pain has been relieved with narcotics and mobic. She is DNR status, is now acutely anemic, and is a Jehovah's witness and will refuse blood products; making her high risk for invasive ischemic testing. I have a low suspicion for ACS and will discuss with attending deferring further workup at this time.    2. Pericardial effusion noted on last echo 06/12/16 Given her hypotension, may consider rechecking an echo this admission to eval effusion.   3. Aortic  aneurysm Stable 4.2 cm on CTA yesterday.   4. Anemia Hb on admission was 11.4 and is now 9.9. She has required several fluid boluses for hypotension, this may be dilutional. She denies bleeding problems. Would recommend heme stool card.   5. Leukocytosis  WBC has fluctuated 15 --> 19 --> 15 She has been afebrile Will obtain UA, CXR negative for acute infection.   For questions or updates, please contact [facility] Please consult [email or web address] for contact info under Cardiology/STEMI.   Signed, Roe [place] Jarquin, PA  01/09/2018 8:00 AM

## 2018-01-09 NOTE — H&P (Signed)
Internal Medicine Attending Admission Note Date: 01/09/2018  Patient name: Whitney Mayer Medical record number: [id number] Date of birth: 1932/08/25 Age: 84 y.o. Gender: female  I saw and evaluated the patient. I reviewed the resident's note and I agree with the resident's findings and plan as documented in the resident's note.  Chief Complaint(s): Chest pain  History - key components related to admission:  Whitney Mayer is an 84 year old woman with a history of rheumatoid arthritis, osteoporosis, hypertension, nonobstructive coronary artery disease, bronchiectasis, and an ascending aortic aneurysm who presents with a one-day history of substernal chest pain worse with breathing deeply or coughing. She is unable to characterize the pain very well but says that it is an ache in the bones. It radiates from the sub-sternum area to the back. It is associated with shortness of breath, but that may be secondary to the pleuritic nature and the pain. She notes a new change in how she transferred from bed to chair which may have resulted in an increased strain on her chest wall. She denies any fevers, shakes, chills, or recent sick contacts. She presented to the emergency department with this pain and was admitted to the internal medicine teaching service for further evaluation and care.  Physical Exam - key components related to admission:  Vitals:   01/09/18 0617 01/09/18 0743 01/09/18 1015 01/09/18 1018  BP: 106/62 (!) 97/56 93/68   Pulse: 84 93 76   Resp: (!) 33 (!) 21 (!) 24   Temp: 98 F (36.7 C) 99 F (37.2 C)    TempSrc: Oral Oral    SpO2: 92% 90%  96%  Weight: 116 lb 6.5 oz (52.8 kg)     Height:       Gen.: Well-developed, woman with obvious deformities secondary to advanced rheumatoid arthritis. Chest wall: Point tenderness at the xiphoid process on palpation. No other point tenderness could be elicited. This reproduced the pain.  Lab results:  Basic Metabolic Panel: Recent Labs   15/18/34 1013 01/08/18 1516  NA 136  --   K 3.6  --   CL 103  --   CO2 22  --   GLUCOSE 101*  --   BUN 9  --   CREATININE 0.54 0.63  CALCIUM 8.5*  --    CBC: Recent Labs    01/08/18 1516 01/09/18 0231  WBC 19.5* 15.1*  HGB 10.5* 9.9*  HCT 33.7* 31.5*  MCV 82.2 82.5  PLT 313 314   Cardiac Enzymes: Recent Labs    01/08/18 1516 01/08/18 1933 01/09/18 0231  TROPONINI <0.03 <0.03 <0.03   CBG: Recent Labs    01/09/18 0741  GLUCAP 96   Urinalysis:  Hazy, and bur, specific gravity 1.039, pH 5.0, protein 30, nitrite negative, leukocytes trace, red blood cells 0-5 per high-power field, white blood cell 6-30 per high-power field.  Misc. Labs:  BNP 125.6 Influenza A negative Influenza B negative  Imaging results:  Dg Chest 2 View  Result Date: 01/08/2018 CLINICAL DATA:  Chest pain.  Difficulty breathing. EXAM: CHEST  2 VIEW COMPARISON:  CT 02/23/2017. FINDINGS: Mediastinum and hilar structures normal. Heart size normal. Mild bibasilar atelectasis/infiltrates. Small left pleural effusion cannot be excluded. No pneumothorax. No acute bony abnormality. IMPRESSION: Mild bibasilar atelectasis/infiltrates. Small left pleural effusion cannot be excluded. Electronically Signed   By: Maisie Fus  Register   On: 01/08/2018 11:10   Ct Angio Chest Pe W/cm &/or Wo Cm  Result Date: 01/08/2018 CLINICAL DATA:  Chest pain and shortness of breath.  Pneumonia. EXAM: CT ANGIOGRAPHY CHEST WITH CONTRAST TECHNIQUE: Multidetector CT imaging of the chest was performed using the standard protocol during bolus administration of intravenous contrast. Multiplanar CT image reconstructions and MIPs were obtained to evaluate the vascular anatomy. CONTRAST:  <See Chart> ISOVUE-370 IOPAMIDOL (ISOVUE-370) INJECTION 76% COMPARISON:  Two-view chest x-ray 01/08/2018. CTA chest scratched at CT chest 02/23/2017 FINDINGS: Cardiovascular: Heart is mildly enlarged. Coronary artery calcifications are present. There no  effusion. Ascending aorta measures 4.2 cm. Pulmonary artery opacification is excellent. No focal filling defects are present. Mediastinum/Nodes: No significant mediastinal, hilar, or axillary adenopathy is present. Lungs/Pleura: Bilateral dependent airspace disease is present. Scarring and atelectasis is present bilaterally. No significant airspace consolidation is present. No pleural or pericardial effusion is present. Upper Abdomen: Limited imaging of the abdomen is unremarkable. Musculoskeletal: A T11 compression fracture is similar the prior exam. No acute fractures are present. AP alignment is anatomic. No focal lytic or blastic lesions are present. Review of the MIP images confirms the above findings. IMPRESSION: 1. No pulmonary embolus. 2. Mild cardiomegaly without failure. 3. bibasilar airspace disease likely reflects atelectasis or scarring. 4. Remote T11 fracture. Electronically Signed   By: Marin Roberts M.D.   On: 01/08/2018 11:44   PA and lateral chest x-ray: Personally reviewed. Possible retrocardiac atelectasis which is new compared to the prior chest x-ray on 09/11/2016.  CT scan of the thorax: Personally reviewed. Dependent atelectasis posteriorly bilaterally with no effusions or masses. The right dependent atelectasis is new compared to the previous CT scan on 02/23/2017.  Other results:  EKG: Personally reviewed. Normal sinus rhythm at 97 bpm, normal axis, prolonged QTc at 542 ms, no significant Q waves, no LVH by voltage, good R wave progression, no ST segment changes, T wave flattening throughout. This ECG is unchanged from the previous ECG on 07/24/2016.  Assessment & Plan by Problem:  Whitney Mayer is an 84 year old woman with a history of rheumatoid arthritis, osteoporosis, hypertension, nonobstructive coronary artery disease, bronchiectasis, and an ascending aortic aneurysm who presents with a one-day history of substernal chest pain worse with breathing deeply or coughing.  Her chest pain is very atypical. In fact it is reproducible on examination and is specifically at the xiphoid process. This therefore likely represents musculoskeletal pain that was brought about by the change in how she transfers from bed to chair. With a history of osteoporosis, there may have even been a fracture in this area. That said, none could be seen on the CT scan or chest x-ray.  1) Musculoskeletal chest wall pain: She has responded somewhat to meloxicam. This will be continued on an as-needed basis. She will also be provided Voltaren gel to place locally over the xiphoid process.  2) Disposition: She is stable for discharge home today with follow-up in her primary care provider's office.

## 2018-01-09 NOTE — Care Management Note (Signed)
Case Management Note  Patient Details  Name: Whitney Mayer MRN: [id number] Date of Birth: 30-Sep-1932  Subjective/Objective:   Chest pain                 Action/Plan: Spoke to pt and daughters at bedside. Pt lives in home with dtr, [name]. She is her primary caregiver. Pt is bedbound. She had [facility] in the past. Contacted [facility] for HHPT for home. Will need HH PT order with [facility], Attending notified. She has hospital bed, wheelchair and drop arm bedside commode.     Expected Discharge Date:                  Expected Discharge Plan:  Home w Home Health Services  In-House Referral:  NA  Discharge planning Services  CM Consult  Post Acute Care Choice:  Home Health Choice offered to:  Patient, Adult Children  DME Arranged:  N/A DME Agency:  NA  HH Arranged:  PT HH Agency:  [facility]  Status of Service:  Completed, signed off  If discussed at Long Length of Stay Meetings, dates discussed:    Additional Comments:  Elliot Cousin, RN 01/09/2018, 4:39 PM

## 2018-01-09 NOTE — Progress Notes (Addendum)
   Subjective: Patient seen resting in her bed this morning. She report that her pain is improved, but is persisted and worsen to about 8/10 with deep inspiration now. Patient able to pinpoint her pain today at the bottom of her sternum. Her family helps to relate how she has been transferring differently to the bedside commode; which involves her pulling herself on to the commode. This may have caused some pain with the increased effort. All questions answered, no further complaints.  Objective:  Vital signs in last 24 hours: Vitals:   01/09/18 0617 01/09/18 0743 01/09/18 1015 01/09/18 1018  BP: 106/62 (!) 97/56 93/68   Pulse: 84 93 76   Resp: (!) 33 (!) 21 (!) 24   Temp: 98 F (36.7 C) 99 F (37.2 C)    TempSrc: Oral Oral    SpO2: 92% 90%  96%  Weight: 116 lb 6.5 oz (52.8 kg)     Height:       Physical Exam  Constitutional: She appears well-developed and well-nourished.  Thin, bed and wheelchair bound female  HENT:  Head: Normocephalic and atraumatic.  Eyes: EOM are normal. Right eye exhibits no discharge. Left eye exhibits no discharge.  Cardiovascular: Normal rate, regular rhythm, normal heart sounds and intact distal pulses.  Pulmonary/Chest: Effort normal. No respiratory distress.  Dry crackles bilateral lower lobes  Abdominal: Soft. Bowel sounds are normal. She exhibits no distension. There is no tenderness.  Musculoskeletal: She exhibits no edema.  Chronic RA deformities Bilat upper and lower extremities Tenderness to palpation of inferior sternum and xiphoid process  Neurological: She is alert.  Skin: Skin is warm and dry.   Assessment/Plan: Ms Saefong is an 84 yo F with a history of RA, Bronchiectasis, CVA, Ascending Aortic Aneurysm, OA, HTN, and Hashimoto's Thyroiditis who presents with one day of chest pain. Patient admitted for chest pain rule out. Normal troponin in the ED, EKG non-ischemic. Non tender to palpation. Pain significantly improved with pain medications in  ED. Patient initially SOB, but this improved with ability to breath more deeply with decrease in pain.  Chest Pain:  Pain Likely musculoskeletal in nature. Pain reproducible on exam with tenderness noted on the inferior edge of of her sternum and her xiphoid process. Pain improved today with NSAIDs. Troponin negative x3. ESR elevated to 70 showing increased inflammation. Patient Oxygenating well on room air when sensor picking up good waveform.  - Cardiology following - Meloxicam 7.29m Daily - Voltaren gel - Tramadol [id number], PRN Pain - Cardiac Monitoring - Will follow up Echocardiogram results ---- ADDENDUM: Echo shows: Moderate LVH, EF 60% to 6558%grade 1 diastolic dysfunction  Leukocytosis: Unclear etiology, possible reactive in the setting of her RA with elevated ESR. No diff. - U/A pending  HTN:  BP softer in ED following Fentanyl administration. - Hold home losartan, and hold on discharge until PCP appointment  Hypothyroidism: (Hx Hashimoto's Thyroiditis) - Continue home Synthroid 7567mDaily  RA: Patient with advanced RA, not taking any DMARDs or other RA medications as she has tried them in the past I did not like them. Takes tylenol, NSAIDs, and PRN tramadol at home for pain. - Meloxicam 7.5mg32mily - Tramadol 50mg56m, PRN Pain  FEN: Heart diet VTE ppx: Lovenox Code Status: DNR, BiPAP okay   Dispo: Anticipated discharge in approximately 0-1 day(s).   MelviNeva Seat2/13/2019, 3:36 PM Pager: [phone]

## 2018-01-09 NOTE — Progress Notes (Signed)
Internal Medicine Attending  Date: 01/09/2018  Patient name: Whitney Mayer Medical record number: [id number] Date of birth: 10/28/1932 Age: 84 y.o. Gender: female  I saw and evaluated the patient. I reviewed the resident's note by Dr. Alinda Money and I agree with the resident's findings and plans as documented in his progress note.  Please see my H&P dated 01/09/2018 for the specifics of my evaluation, assessment, and plan from earlier in the day.

## 2018-01-09 NOTE — Progress Notes (Signed)
  Echocardiogram 2D Echocardiogram has been performed.  Roosvelt Maser F 01/09/2018, 12:34 PM

## 2018-01-09 NOTE — Progress Notes (Addendum)
Patient's BP have been trending down.  SBP have gone from low 110 to 80s.  Text MD on call to made them aware.  Will keep monitoring patient.  MD order: 1L over 4 hours to increase BP.

## 2018-01-13 DIAGNOSIS — I2510 Atherosclerotic heart disease of native coronary artery without angina pectoris: Secondary | ICD-10-CM | POA: Diagnosis not present

## 2018-01-13 DIAGNOSIS — M069 Rheumatoid arthritis, unspecified: Secondary | ICD-10-CM | POA: Diagnosis not present

## 2018-01-13 DIAGNOSIS — I714 Abdominal aortic aneurysm, without rupture: Secondary | ICD-10-CM | POA: Diagnosis not present

## 2018-01-13 DIAGNOSIS — M1990 Unspecified osteoarthritis, unspecified site: Secondary | ICD-10-CM | POA: Diagnosis not present

## 2018-01-13 DIAGNOSIS — R0789 Other chest pain: Secondary | ICD-10-CM | POA: Diagnosis not present

## 2018-01-13 DIAGNOSIS — I10 Essential (primary) hypertension: Secondary | ICD-10-CM | POA: Diagnosis not present

## 2018-01-13 DIAGNOSIS — J479 Bronchiectasis, uncomplicated: Secondary | ICD-10-CM | POA: Diagnosis not present

## 2018-01-13 DIAGNOSIS — D649 Anemia, unspecified: Secondary | ICD-10-CM | POA: Diagnosis not present

## 2018-01-13 DIAGNOSIS — I480 Paroxysmal atrial fibrillation: Secondary | ICD-10-CM | POA: Diagnosis not present

## 2018-01-15 DIAGNOSIS — I10 Essential (primary) hypertension: Secondary | ICD-10-CM | POA: Diagnosis not present

## 2018-01-15 DIAGNOSIS — I2510 Atherosclerotic heart disease of native coronary artery without angina pectoris: Secondary | ICD-10-CM | POA: Diagnosis not present

## 2018-01-15 DIAGNOSIS — D649 Anemia, unspecified: Secondary | ICD-10-CM | POA: Diagnosis not present

## 2018-01-15 DIAGNOSIS — M1990 Unspecified osteoarthritis, unspecified site: Secondary | ICD-10-CM | POA: Diagnosis not present

## 2018-01-15 DIAGNOSIS — M069 Rheumatoid arthritis, unspecified: Secondary | ICD-10-CM | POA: Diagnosis not present

## 2018-01-15 DIAGNOSIS — R0789 Other chest pain: Secondary | ICD-10-CM | POA: Diagnosis not present

## 2018-01-15 DIAGNOSIS — J479 Bronchiectasis, uncomplicated: Secondary | ICD-10-CM | POA: Diagnosis not present

## 2018-01-15 DIAGNOSIS — I714 Abdominal aortic aneurysm, without rupture: Secondary | ICD-10-CM | POA: Diagnosis not present

## 2018-01-15 DIAGNOSIS — I480 Paroxysmal atrial fibrillation: Secondary | ICD-10-CM | POA: Diagnosis not present

## 2018-01-23 ENCOUNTER — Other Ambulatory Visit: Payer: Self-pay

## 2018-01-23 ENCOUNTER — Ambulatory Visit (INDEPENDENT_AMBULATORY_CARE_PROVIDER_SITE_OTHER): Payer: Medicare HMO | Admitting: Internal Medicine

## 2018-01-23 VITALS — BP 137/85 | HR 86 | Temp 97.7°F | Ht 62.0 in

## 2018-01-23 DIAGNOSIS — M20032 Swan-neck deformity of left finger(s): Secondary | ICD-10-CM | POA: Diagnosis not present

## 2018-01-23 DIAGNOSIS — Z7982 Long term (current) use of aspirin: Secondary | ICD-10-CM

## 2018-01-23 DIAGNOSIS — Z09 Encounter for follow-up examination after completed treatment for conditions other than malignant neoplasm: Secondary | ICD-10-CM | POA: Diagnosis not present

## 2018-01-23 DIAGNOSIS — Z8739 Personal history of other diseases of the musculoskeletal system and connective tissue: Secondary | ICD-10-CM | POA: Diagnosis not present

## 2018-01-23 DIAGNOSIS — I712 Thoracic aortic aneurysm, without rupture: Secondary | ICD-10-CM

## 2018-01-23 DIAGNOSIS — Z79891 Long term (current) use of opiate analgesic: Secondary | ICD-10-CM

## 2018-01-23 DIAGNOSIS — I351 Nonrheumatic aortic (valve) insufficiency: Secondary | ICD-10-CM

## 2018-01-23 DIAGNOSIS — Z79890 Hormone replacement therapy (postmenopausal): Secondary | ICD-10-CM

## 2018-01-23 DIAGNOSIS — D509 Iron deficiency anemia, unspecified: Secondary | ICD-10-CM | POA: Diagnosis not present

## 2018-01-23 DIAGNOSIS — M170 Bilateral primary osteoarthritis of knee: Secondary | ICD-10-CM | POA: Diagnosis not present

## 2018-01-23 DIAGNOSIS — Z23 Encounter for immunization: Secondary | ICD-10-CM | POA: Diagnosis not present

## 2018-01-23 DIAGNOSIS — I7121 Aneurysm of the ascending aorta, without rupture: Secondary | ICD-10-CM

## 2018-01-23 DIAGNOSIS — Z79899 Other long term (current) drug therapy: Secondary | ICD-10-CM

## 2018-01-23 DIAGNOSIS — I10 Essential (primary) hypertension: Secondary | ICD-10-CM | POA: Diagnosis not present

## 2018-01-23 DIAGNOSIS — M20031 Swan-neck deformity of right finger(s): Secondary | ICD-10-CM

## 2018-01-23 DIAGNOSIS — I700 Atherosclerosis of aorta: Secondary | ICD-10-CM | POA: Diagnosis not present

## 2018-01-23 DIAGNOSIS — E063 Autoimmune thyroiditis: Secondary | ICD-10-CM

## 2018-01-23 DIAGNOSIS — M069 Rheumatoid arthritis, unspecified: Secondary | ICD-10-CM | POA: Diagnosis not present

## 2018-01-23 DIAGNOSIS — R071 Chest pain on breathing: Secondary | ICD-10-CM

## 2018-01-23 MED ORDER — DULOXETINE HCL 30 MG PO CPEP: 30.0000 mg | ORAL_CAPSULE | Freq: Every day | ORAL | 3 refills | Status: DC

## 2018-01-23 MED ORDER — LOSARTAN POTASSIUM 100 MG PO TABS: 100.0000 mg | ORAL_TABLET | Freq: Every day | ORAL | 3 refills | Status: DC

## 2018-01-23 MED ORDER — TRAMADOL HCL 50 MG PO TABS: 50.0000 mg | ORAL_TABLET | Freq: Every day | ORAL | 0 refills | Status: DC | PRN

## 2018-01-23 NOTE — Patient Instructions (Addendum)
Thank you for coming to the clinic today. It was a pleasure to see you.   For your hypertension, please start taking losartan 100 mg daily  For your thyroid please check the prescription on the synthroid bottle your taking at home and let us know what the dose is    For your knee pain -to need to use Voltaren, ice or heat, and physical therapy.  For starting a new medication today called Cymbalta this will take a while to take effect but might help with your pain long-term.  I am seeing you a prescription for tramadol to be used only in the pain is at its worse. Please be very careful when taking the tramadol, it can cause you to feel tired and weak. You can take tylenol if it helps but should avoid ibuprofen and other NSAIDs as this can worsen your high blood pressure.   FOLLOW-UP INSTRUCTIONS When: 6 months with Dr. Obie Dredge For: Follow-up of your blood pressure, anemia and thyroid  what to bring: all of your medication bottles   Please call our clinic if you have any questions or concerns, we may be able to help and keep you from a long and expensive emergency room wait. Our clinic and after hours phone number is [phone], there is always someone available.

## 2018-01-23 NOTE — Progress Notes (Signed)
CC: Follow up after hospitalization for musculoskeletal chest wall pain   HPI:  [name] is a 84 y.o. with PMH as listed below who presents for follow up after hospitalization for musculo. Please see the assessment and plans for the status of the patient chronic medical problems.    Past Medical History:  Diagnosis Date  . High cholesterol   . Hypertension   . Hypothyroidism   . Pneumonia ~ 1943  . Refusal of blood transfusions as patient is Jehovah's Witness   . Rheumatoid arthritis (HCC)    "all over; hands, feet, knees, joints" (01/08/2018)  . 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 She took 5 days of the meloxicam and has had adjusted her way of transferring and has had no further chest wall pain.  - continue to monitor   Iron deficiency anemia  She's had persistent low normal hemoglobin for years and at the time of recent hospitalization was found to have hemoglobin down to 9.9 along with a low MCH. Denies signs of blood loss including melena/diarrhea or BRBPR, hemoptysis, hematochezia, or mucosal or skin bleeding.  Repeat CBC and and iron studies today > Hemoglobin 10.9, now with thrombocytosis 460, ferritin is 52, with low TIBC 240 and iron saturation 11.   Iron  studies with mixed components of iron deficiency and (low iron saturation and low normal ferritin) and chronic disease related to RA ( low TIBC). It is especially important to keep her from developing symptomatic anemia because we would not have the option to provide blood products as she is Jehovah's witness.   - start iron supplementation  - start senokot PRN   - Recheck CBC at follow up in 1 month    Osteoarthritis  Presenting today with aching pain which is especially bad in her knees. Shes been using voltaren and just recently was evaluated to start PT at home. On exam she has bony enlargement and crepitus of the knees without signs of inflammation.  Assessment: this is most likely osteoarthritis  - start duloxetine 30 mg daily   - refill tramadol with caution about the sedating effects of this medication and it will only be used as needed ( 20 tablets lasted the last 6 months )  - continue voltaren, ice, and PT   Hypothyroidism  At last office visit TSH was 4.9 and she reported symptoms of hypothyroidism, levothyroxine was increased from 50 mg daily to 75 mg daily but the dose noted in her med list is 50 mg daily and she has been getting 50 mg daily at home. Today TSH is 7.  - increase synthroid to 75 mg daily   Hypertension Losartan and diltiazem were held during recent hospitalization because  she had soft blood pressures. Today her blood pressures not controlled 155/82. She has been taking Losartan 50 mg daily which she had at home. I will increase losartan back to her previous dose of 100 mg daily.  - Restart losartan 100 mg daily   - Return in 1 month for follow up blood pressure   Aortic atherosclerosis  Found incidentally on high resolution CT chest 11/10/2016. She does not have symptoms of chest pain or distal ischemia.  - continue aspirin and atorvastatin  - continue to monitor   Ascending aortic aneurysm  CTA 05/2016 Ascending thoracic aorta had a diameter of 4.6 x 4.6 cm.    Repeat CTA PE  12/2017 ascending aorta measured 4.2 cm.   Echo one month ago showed mild aortic regurgitation.  She denies symptoms of chest pain.  The surveilance recommendation for ascending aortic aneurysms measuring 3.5 - 4.5 cm is yearly CT or MR angiogram and echocardiogram if there is known valvular involvement.  - continue to monitor with yearly imaging and echocardiogram   See Encounters Tab for problem based charting.  Patient discussed with Dr. Heide Spark

## 2018-01-24 DIAGNOSIS — I480 Paroxysmal atrial fibrillation: Secondary | ICD-10-CM | POA: Diagnosis not present

## 2018-01-24 DIAGNOSIS — R0789 Other chest pain: Secondary | ICD-10-CM | POA: Diagnosis not present

## 2018-01-24 DIAGNOSIS — M6281 Muscle weakness (generalized): Secondary | ICD-10-CM | POA: Diagnosis not present

## 2018-01-24 DIAGNOSIS — I2510 Atherosclerotic heart disease of native coronary artery without angina pectoris: Secondary | ICD-10-CM | POA: Diagnosis not present

## 2018-01-24 DIAGNOSIS — I714 Abdominal aortic aneurysm, without rupture: Secondary | ICD-10-CM | POA: Diagnosis not present

## 2018-01-24 DIAGNOSIS — J479 Bronchiectasis, uncomplicated: Secondary | ICD-10-CM | POA: Diagnosis not present

## 2018-01-24 DIAGNOSIS — M069 Rheumatoid arthritis, unspecified: Secondary | ICD-10-CM | POA: Diagnosis not present

## 2018-01-24 DIAGNOSIS — D649 Anemia, unspecified: Secondary | ICD-10-CM | POA: Diagnosis not present

## 2018-01-24 DIAGNOSIS — M1990 Unspecified osteoarthritis, unspecified site: Secondary | ICD-10-CM | POA: Diagnosis not present

## 2018-01-24 DIAGNOSIS — I10 Essential (primary) hypertension: Secondary | ICD-10-CM | POA: Diagnosis not present

## 2018-01-24 LAB — CBC WITH DIFFERENTIAL/PLATELET
BASOS: 1 %
Basophils Absolute: 0 10*3/uL (ref 0.0–0.2)
EOS (ABSOLUTE): 0.3 10*3/uL (ref 0.0–0.4)
EOS: 4 %
HEMATOCRIT: 34.1 % (ref 34.0–46.6)
Hemoglobin: 10.9 g/dL — ABNORMAL LOW (ref 11.1–15.9)
IMMATURE GRANS (ABS): 0 10*3/uL (ref 0.0–0.1)
IMMATURE GRANULOCYTES: 0 %
LYMPHS: 18 %
Lymphocytes Absolute: 1.5 10*3/uL (ref 0.7–3.1)
MCH: 25.5 pg — ABNORMAL LOW (ref 26.6–33.0)
MCHC: 32 g/dL (ref 31.5–35.7)
MCV: 80 fL (ref 79–97)
Monocytes Absolute: 0.6 10*3/uL (ref 0.1–0.9)
Monocytes: 7 %
NEUTROS PCT: 70 %
Neutrophils Absolute: 6.1 10*3/uL (ref 1.4–7.0)
PLATELETS: 460 10*3/uL — AB (ref 150–379)
RBC: 4.27 x10E6/uL (ref 3.77–5.28)
RDW: 16.3 % — ABNORMAL HIGH (ref 12.3–15.4)
WBC: 8.7 10*3/uL (ref 3.4–10.8)

## 2018-01-24 LAB — TSH: TSH: 7.03 u[IU]/mL — ABNORMAL HIGH (ref 0.450–4.500)

## 2018-01-24 LAB — IRON AND TIBC
IRON SATURATION: 11 % — AB (ref 15–55)
IRON: 26 ug/dL — AB (ref 27–139)
Total Iron Binding Capacity: 240 ug/dL — ABNORMAL LOW (ref 250–450)
UIBC: 214 ug/dL (ref 118–369)

## 2018-01-24 LAB — FERRITIN: Ferritin: 52 ng/mL (ref 15–150)

## 2018-01-25 ENCOUNTER — Encounter: Payer: Self-pay | Admitting: Internal Medicine

## 2018-01-25 DIAGNOSIS — M069 Rheumatoid arthritis, unspecified: Secondary | ICD-10-CM | POA: Diagnosis not present

## 2018-01-25 DIAGNOSIS — R0789 Other chest pain: Secondary | ICD-10-CM | POA: Diagnosis not present

## 2018-01-25 DIAGNOSIS — D649 Anemia, unspecified: Secondary | ICD-10-CM | POA: Insufficient documentation

## 2018-01-25 DIAGNOSIS — I10 Essential (primary) hypertension: Secondary | ICD-10-CM | POA: Diagnosis not present

## 2018-01-25 DIAGNOSIS — I480 Paroxysmal atrial fibrillation: Secondary | ICD-10-CM | POA: Diagnosis not present

## 2018-01-25 DIAGNOSIS — J479 Bronchiectasis, uncomplicated: Secondary | ICD-10-CM | POA: Diagnosis not present

## 2018-01-25 DIAGNOSIS — M1990 Unspecified osteoarthritis, unspecified site: Secondary | ICD-10-CM | POA: Diagnosis not present

## 2018-01-25 DIAGNOSIS — I2510 Atherosclerotic heart disease of native coronary artery without angina pectoris: Secondary | ICD-10-CM | POA: Diagnosis not present

## 2018-01-25 DIAGNOSIS — I714 Abdominal aortic aneurysm, without rupture: Secondary | ICD-10-CM | POA: Diagnosis not present

## 2018-01-25 MED ORDER — FERROUS SULFATE 325 (65 FE) MG PO TABS: 325.0000 mg | ORAL_TABLET | Freq: Every day | ORAL | 3 refills | Status: DC

## 2018-01-25 MED ORDER — LEVOTHYROXINE SODIUM 75 MCG PO TABS: 75.0000 ug | ORAL_TABLET | Freq: Every day | ORAL | 1 refills | Status: DC

## 2018-01-25 MED ORDER — SENNOSIDES 8.6 MG PO TABS: 1.0000 | ORAL_TABLET | Freq: Every day | ORAL | 0 refills | Status: AC | PRN

## 2018-01-25 NOTE — Assessment & Plan Note (Signed)
Found incidentally on high resolution CT chest 11/10/2016. She does not have symptoms of chest pain or distal ischemia.  - continue aspirin and atorvastatin  - continue to monitor

## 2018-01-25 NOTE — Assessment & Plan Note (Signed)
She's had persistent low normal hemoglobin for years and at the time of recent hospitalization was found to have hemoglobin down to 9.9 along with a low MCH. Denies signs of blood loss including melena/diarrhea or BRBPR, hemoptysis, hematochezia, or mucosal or skin bleeding.  Repeat CBC and and iron studies today > Hemoglobin 10.9, now with thrombocytosis 460, ferritin is 52, with low TIBC 240 and iron saturation 11.   Iron studies with mixed components of iron deficiency and (low iron saturation and low normal ferritin) and chronic disease related to RA ( low TIBC). It is especially important to keep her from developing symptomatic anemia because we would not have the option to provide blood products as she is Jehovah's witness.   - start iron supplementation  - start senokot PRN   - Recheck CBC at follow up in 1 month

## 2018-01-25 NOTE — Assessment & Plan Note (Signed)
CTA 05/2016 Ascending thoracic aorta had a diameter of 4.6 x 4.6 cm.  Repeat CTA PE  12/2017 ascending aorta measured 4.2 cm.   Echo one month ago showed mild aortic regurgitation.  She denies symptoms of chest pain.  The surveilance recommendation for ascending aortic aneurysms measuring 3.5 - 4.5 cm is yearly CT or MR angiogram and echocardiogram if there is known valvular involvement.  - continue to monitor with yearly imaging and echocardiogram

## 2018-01-25 NOTE — Assessment & Plan Note (Signed)
Hospitalized earlier this month for chest wall pain, she was prescribed voltaren and meloxicam and is here today for follow up. She took 5 days of the meloxicam and has had adjusted her way of transferring and has had no further chest wall pain.  - continue to monitor

## 2018-01-25 NOTE — Assessment & Plan Note (Signed)
Losartan and diltiazem were held during recent hospitalization because she had soft blood pressures. Today her blood pressures not controlled 155/82. She has been taking Losartan 50 mg daily which she had at home. I will increase losartan back to her previous dose of 100 mg daily.  - Restart losartan 100 mg daily   - Return in 1 month for follow up blood pressure

## 2018-01-25 NOTE — Assessment & Plan Note (Signed)
Presenting today with aching pain which is especially bad in her knees. Shes been using voltaren and just recently was evaluated to start PT at home. On exam she has bony enlargement and crepitus of the knees without signs of inflammation.  Assessment: this is most likely osteoarthritis  - start duloxetine 30 mg daily   - refill tramadol with caution about the sedating effects of this medication and it will only be used as needed ( 20 tablets lasted the last 6 months )  - continue voltaren, ice, and PT

## 2018-01-25 NOTE — Assessment & Plan Note (Signed)
At last office visit TSH was 4.9 and she reported symptoms of hypothyroidism, levothyroxine was increased from 50 mg daily to 75 mg daily but the dose noted in her med list is 50 mg daily and she has been getting 50 mg daily at home. Today TSH is 7.  - increase synthroid to 75 mg daily

## 2018-01-28 NOTE — Progress Notes (Signed)
Internal Medicine Clinic Attending  Case discussed with Dr. Blum at the time of the visit.  We reviewed the resident's history and exam and pertinent patient test results.  I agree with the assessment, diagnosis, and plan of care documented in the resident's note. 

## 2018-01-29 DIAGNOSIS — D649 Anemia, unspecified: Secondary | ICD-10-CM | POA: Diagnosis not present

## 2018-01-29 DIAGNOSIS — I480 Paroxysmal atrial fibrillation: Secondary | ICD-10-CM | POA: Diagnosis not present

## 2018-01-29 DIAGNOSIS — R0789 Other chest pain: Secondary | ICD-10-CM | POA: Diagnosis not present

## 2018-01-29 DIAGNOSIS — I714 Abdominal aortic aneurysm, without rupture: Secondary | ICD-10-CM | POA: Diagnosis not present

## 2018-01-29 DIAGNOSIS — M069 Rheumatoid arthritis, unspecified: Secondary | ICD-10-CM | POA: Diagnosis not present

## 2018-01-29 DIAGNOSIS — J479 Bronchiectasis, uncomplicated: Secondary | ICD-10-CM | POA: Diagnosis not present

## 2018-01-29 DIAGNOSIS — I2510 Atherosclerotic heart disease of native coronary artery without angina pectoris: Secondary | ICD-10-CM | POA: Diagnosis not present

## 2018-01-29 DIAGNOSIS — I10 Essential (primary) hypertension: Secondary | ICD-10-CM | POA: Diagnosis not present

## 2018-01-29 DIAGNOSIS — M1990 Unspecified osteoarthritis, unspecified site: Secondary | ICD-10-CM | POA: Diagnosis not present

## 2018-02-05 DIAGNOSIS — M069 Rheumatoid arthritis, unspecified: Secondary | ICD-10-CM | POA: Diagnosis not present

## 2018-02-05 DIAGNOSIS — I10 Essential (primary) hypertension: Secondary | ICD-10-CM | POA: Diagnosis not present

## 2018-02-05 DIAGNOSIS — R0789 Other chest pain: Secondary | ICD-10-CM | POA: Diagnosis not present

## 2018-02-05 DIAGNOSIS — I714 Abdominal aortic aneurysm, without rupture: Secondary | ICD-10-CM | POA: Diagnosis not present

## 2018-02-05 DIAGNOSIS — I2510 Atherosclerotic heart disease of native coronary artery without angina pectoris: Secondary | ICD-10-CM | POA: Diagnosis not present

## 2018-02-05 DIAGNOSIS — I480 Paroxysmal atrial fibrillation: Secondary | ICD-10-CM | POA: Diagnosis not present

## 2018-02-05 DIAGNOSIS — M1990 Unspecified osteoarthritis, unspecified site: Secondary | ICD-10-CM | POA: Diagnosis not present

## 2018-02-05 DIAGNOSIS — D649 Anemia, unspecified: Secondary | ICD-10-CM | POA: Diagnosis not present

## 2018-02-05 DIAGNOSIS — J479 Bronchiectasis, uncomplicated: Secondary | ICD-10-CM | POA: Diagnosis not present

## 2018-02-13 DIAGNOSIS — R0789 Other chest pain: Secondary | ICD-10-CM | POA: Diagnosis not present

## 2018-02-13 DIAGNOSIS — I2510 Atherosclerotic heart disease of native coronary artery without angina pectoris: Secondary | ICD-10-CM | POA: Diagnosis not present

## 2018-02-13 DIAGNOSIS — I480 Paroxysmal atrial fibrillation: Secondary | ICD-10-CM | POA: Diagnosis not present

## 2018-02-13 DIAGNOSIS — M069 Rheumatoid arthritis, unspecified: Secondary | ICD-10-CM | POA: Diagnosis not present

## 2018-02-13 DIAGNOSIS — I10 Essential (primary) hypertension: Secondary | ICD-10-CM | POA: Diagnosis not present

## 2018-02-13 DIAGNOSIS — D649 Anemia, unspecified: Secondary | ICD-10-CM | POA: Diagnosis not present

## 2018-02-13 DIAGNOSIS — M1990 Unspecified osteoarthritis, unspecified site: Secondary | ICD-10-CM | POA: Diagnosis not present

## 2018-02-13 DIAGNOSIS — J479 Bronchiectasis, uncomplicated: Secondary | ICD-10-CM | POA: Diagnosis not present

## 2018-02-13 DIAGNOSIS — I714 Abdominal aortic aneurysm, without rupture: Secondary | ICD-10-CM | POA: Diagnosis not present

## 2018-02-21 DIAGNOSIS — I714 Abdominal aortic aneurysm, without rupture: Secondary | ICD-10-CM | POA: Diagnosis not present

## 2018-02-21 DIAGNOSIS — R0789 Other chest pain: Secondary | ICD-10-CM | POA: Diagnosis not present

## 2018-02-21 DIAGNOSIS — J479 Bronchiectasis, uncomplicated: Secondary | ICD-10-CM | POA: Diagnosis not present

## 2018-02-21 DIAGNOSIS — M6281 Muscle weakness (generalized): Secondary | ICD-10-CM | POA: Diagnosis not present

## 2018-02-21 DIAGNOSIS — I2510 Atherosclerotic heart disease of native coronary artery without angina pectoris: Secondary | ICD-10-CM | POA: Diagnosis not present

## 2018-02-21 DIAGNOSIS — M1990 Unspecified osteoarthritis, unspecified site: Secondary | ICD-10-CM | POA: Diagnosis not present

## 2018-02-21 DIAGNOSIS — I480 Paroxysmal atrial fibrillation: Secondary | ICD-10-CM | POA: Diagnosis not present

## 2018-02-21 DIAGNOSIS — I10 Essential (primary) hypertension: Secondary | ICD-10-CM | POA: Diagnosis not present

## 2018-02-21 DIAGNOSIS — M069 Rheumatoid arthritis, unspecified: Secondary | ICD-10-CM | POA: Diagnosis not present

## 2018-02-21 DIAGNOSIS — D649 Anemia, unspecified: Secondary | ICD-10-CM | POA: Diagnosis not present

## 2018-02-22 ENCOUNTER — Other Ambulatory Visit: Payer: Self-pay

## 2018-02-22 ENCOUNTER — Ambulatory Visit (INDEPENDENT_AMBULATORY_CARE_PROVIDER_SITE_OTHER): Payer: Medicare HMO | Admitting: Internal Medicine

## 2018-02-22 VITALS — BP 143/92 | HR 86 | Temp 97.6°F | Wt 109.3 lb

## 2018-02-22 DIAGNOSIS — I10 Essential (primary) hypertension: Secondary | ICD-10-CM | POA: Diagnosis not present

## 2018-02-22 DIAGNOSIS — D509 Iron deficiency anemia, unspecified: Secondary | ICD-10-CM

## 2018-02-22 DIAGNOSIS — Z79899 Other long term (current) drug therapy: Secondary | ICD-10-CM | POA: Diagnosis not present

## 2018-02-22 DIAGNOSIS — D638 Anemia in other chronic diseases classified elsewhere: Secondary | ICD-10-CM

## 2018-02-22 NOTE — Progress Notes (Signed)
Internal Medicine Clinic Attending  Case discussed with Dr. Wallace at the time of the visit.  We reviewed the resident's history and exam and pertinent patient test results.  I agree with the assessment, diagnosis, and plan of care documented in the resident's note.  

## 2018-02-22 NOTE — Assessment & Plan Note (Signed)
BP: (!) 143/92   Her BP today is mildly elevated currently on monotherapy with Losartan 100mg  daily.  She previously was on Diltiazem but this was discontinued during a hospitalization due to lower blood pressures.     Plan: - Continue losartan 100mg  daily - Given her age, blood pressure around 140 is likely reasonable.  I will defer to her PCP for further management.

## 2018-02-22 NOTE — Patient Instructions (Signed)
FOLLOW-UP INSTRUCTIONS When: about 3 months or so with Dr Obie Dredge For: follow up blood pressure and anemia What to bring: current medications  We are checking your hemoglobin today to assess your anemia.  I will let you know if anything changes but for now continue your current medications.

## 2018-02-22 NOTE — Progress Notes (Signed)
   CC: HTN and anemia f/u  HPI:  [name] is a 84 y.o. woman with a past medical history listed below here today for follow up of her HTN and anemia.  For details of today's visit and the status of her chronic medical issues please refer to the assessment and plan.   Past Medical History:  Diagnosis Date  . High cholesterol   . Hypertension   . Hypothyroidism   . Pneumonia ~ 1943  . Refusal of blood transfusions as patient is Jehovah's Witness   . Rheumatoid arthritis (HCC)    "all over; hands, feet, knees, joints" (01/08/2018)  . Thyroid disease    Review of Systems:  Please see pertinent ROS reviewed in HPI and problem based charting.   Physical Exam:  Vitals:   02/22/18 1421  BP: (!) 143/92  Pulse: 86  Temp: 97.6 F (36.4 C)  TempSrc: Oral  SpO2: 93%  Weight: 109 lb 4.8 oz (49.6 kg)   Physical Exam  Constitutional: She is oriented to person, place, and time and well-developed, well-nourished, and in no distress.  Very pleasant, elderly woman, sitting in wheelchair, accompanied by her daughter  Cardiovascular: Normal rate and regular rhythm.  Pulmonary/Chest: Effort normal and breath sounds normal.  Neurological: She is alert and oriented to person, place, and time.  Skin: Skin is warm and dry.  Psychiatric: Mood and affect normal.     Assessment & Plan:   See Encounters Tab for problem based charting.  Patient discussed with Dr. Rogelia Boga.  Hypertension BP: (!) 143/92   Her BP today is mildly elevated currently on monotherapy with Losartan 100mg  daily.  She previously was on Diltiazem but this was discontinued during a hospitalization due to lower blood pressures.     Plan: - Continue losartan 100mg  daily - Given her age, blood pressure around 140 is likely reasonable.  I will defer to her PCP for further management.   Anemia She is here for follow up of her anemia.  Dr started her on PO iron about 1 month ago. She reports doing well on this  medication and her daughter reports that she thinks her mother has been more energetic and feeling stronger.  They continue to deny loss of blood such as melena or BRPBR.    Plan: - Check CBC today - Given her mixed components of IDA and anemia of chronic disease, it is likely reasonable to recheck iron studies every 6-12 months to ensure she is not becoming iron overloaded.

## 2018-02-22 NOTE — Assessment & Plan Note (Signed)
She is here for follow up of her anemia.  Dr Obie Dredge started her on PO iron about 1 month ago. She reports doing well on this medication and her daughter reports that she thinks her mother has been more energetic and feeling stronger.  They continue to deny loss of blood such as melena or BRPBR.    Plan: - Check CBC today - Given her mixed components of IDA and anemia of chronic disease, it is likely reasonable to recheck iron studies every 6-12 months to ensure she is not becoming iron overloaded.

## 2018-02-23 LAB — CBC
Hematocrit: 35.1 % (ref 34.0–46.6)
Hemoglobin: 11.2 g/dL (ref 11.1–15.9)
MCH: 25.9 pg — AB (ref 26.6–33.0)
MCHC: 31.9 g/dL (ref 31.5–35.7)
MCV: 81 fL (ref 79–97)
Platelets: 380 10*3/uL — ABNORMAL HIGH (ref 150–379)
RBC: 4.32 x10E6/uL (ref 3.77–5.28)
RDW: 18 % — ABNORMAL HIGH (ref 12.3–15.4)
WBC: 9 10*3/uL (ref 3.4–10.8)

## 2018-02-27 DIAGNOSIS — D649 Anemia, unspecified: Secondary | ICD-10-CM | POA: Diagnosis not present

## 2018-02-27 DIAGNOSIS — I480 Paroxysmal atrial fibrillation: Secondary | ICD-10-CM | POA: Diagnosis not present

## 2018-02-27 DIAGNOSIS — J479 Bronchiectasis, uncomplicated: Secondary | ICD-10-CM | POA: Diagnosis not present

## 2018-02-27 DIAGNOSIS — M069 Rheumatoid arthritis, unspecified: Secondary | ICD-10-CM | POA: Diagnosis not present

## 2018-02-27 DIAGNOSIS — I2510 Atherosclerotic heart disease of native coronary artery without angina pectoris: Secondary | ICD-10-CM | POA: Diagnosis not present

## 2018-02-27 DIAGNOSIS — M1990 Unspecified osteoarthritis, unspecified site: Secondary | ICD-10-CM | POA: Diagnosis not present

## 2018-02-27 DIAGNOSIS — R0789 Other chest pain: Secondary | ICD-10-CM | POA: Diagnosis not present

## 2018-02-27 DIAGNOSIS — I714 Abdominal aortic aneurysm, without rupture: Secondary | ICD-10-CM | POA: Diagnosis not present

## 2018-02-27 DIAGNOSIS — I10 Essential (primary) hypertension: Secondary | ICD-10-CM | POA: Diagnosis not present

## 2018-03-12 DIAGNOSIS — I2510 Atherosclerotic heart disease of native coronary artery without angina pectoris: Secondary | ICD-10-CM | POA: Diagnosis not present

## 2018-03-12 DIAGNOSIS — I714 Abdominal aortic aneurysm, without rupture: Secondary | ICD-10-CM | POA: Diagnosis not present

## 2018-03-12 DIAGNOSIS — M1990 Unspecified osteoarthritis, unspecified site: Secondary | ICD-10-CM | POA: Diagnosis not present

## 2018-03-12 DIAGNOSIS — M069 Rheumatoid arthritis, unspecified: Secondary | ICD-10-CM | POA: Diagnosis not present

## 2018-03-12 DIAGNOSIS — D649 Anemia, unspecified: Secondary | ICD-10-CM | POA: Diagnosis not present

## 2018-03-12 DIAGNOSIS — I480 Paroxysmal atrial fibrillation: Secondary | ICD-10-CM | POA: Diagnosis not present

## 2018-03-12 DIAGNOSIS — J479 Bronchiectasis, uncomplicated: Secondary | ICD-10-CM | POA: Diagnosis not present

## 2018-03-12 DIAGNOSIS — R0789 Other chest pain: Secondary | ICD-10-CM | POA: Diagnosis not present

## 2018-03-12 DIAGNOSIS — I10 Essential (primary) hypertension: Secondary | ICD-10-CM | POA: Diagnosis not present

## 2018-03-14 ENCOUNTER — Other Ambulatory Visit: Payer: Self-pay | Admitting: *Deleted

## 2018-03-14 MED ORDER — ALENDRONATE SODIUM 70 MG PO TABS: 70.0000 mg | ORAL_TABLET | ORAL | 4 refills | Status: DC

## 2018-03-24 DIAGNOSIS — M069 Rheumatoid arthritis, unspecified: Secondary | ICD-10-CM | POA: Diagnosis not present

## 2018-03-24 DIAGNOSIS — M6281 Muscle weakness (generalized): Secondary | ICD-10-CM | POA: Diagnosis not present

## 2018-03-28 DIAGNOSIS — H25811 Combined forms of age-related cataract, right eye: Secondary | ICD-10-CM | POA: Diagnosis not present

## 2018-03-28 DIAGNOSIS — Z961 Presence of intraocular lens: Secondary | ICD-10-CM | POA: Diagnosis not present

## 2018-04-03 ENCOUNTER — Other Ambulatory Visit: Payer: Self-pay | Admitting: Internal Medicine

## 2018-04-03 NOTE — Telephone Encounter (Signed)
Dr Minda Meo in the clinic now please send to her box

## 2018-04-23 DIAGNOSIS — M6281 Muscle weakness (generalized): Secondary | ICD-10-CM | POA: Diagnosis not present

## 2018-04-23 DIAGNOSIS — M069 Rheumatoid arthritis, unspecified: Secondary | ICD-10-CM | POA: Diagnosis not present

## 2018-05-01 DIAGNOSIS — L509 Urticaria, unspecified: Secondary | ICD-10-CM | POA: Diagnosis not present

## 2018-05-24 ENCOUNTER — Other Ambulatory Visit: Payer: Self-pay

## 2018-05-24 ENCOUNTER — Ambulatory Visit (INDEPENDENT_AMBULATORY_CARE_PROVIDER_SITE_OTHER): Payer: Medicare HMO | Admitting: Internal Medicine

## 2018-05-24 VITALS — BP 156/98 | HR 90 | Temp 98.0°F | Ht 62.0 in

## 2018-05-24 DIAGNOSIS — M069 Rheumatoid arthritis, unspecified: Secondary | ICD-10-CM | POA: Diagnosis not present

## 2018-05-24 DIAGNOSIS — J309 Allergic rhinitis, unspecified: Secondary | ICD-10-CM

## 2018-05-24 DIAGNOSIS — E063 Autoimmune thyroiditis: Secondary | ICD-10-CM

## 2018-05-24 DIAGNOSIS — M6281 Muscle weakness (generalized): Secondary | ICD-10-CM | POA: Diagnosis not present

## 2018-05-24 DIAGNOSIS — Z79899 Other long term (current) drug therapy: Secondary | ICD-10-CM | POA: Diagnosis not present

## 2018-05-24 DIAGNOSIS — I10 Essential (primary) hypertension: Secondary | ICD-10-CM

## 2018-05-24 DIAGNOSIS — Z7982 Long term (current) use of aspirin: Secondary | ICD-10-CM | POA: Diagnosis not present

## 2018-05-24 DIAGNOSIS — Z760 Encounter for issue of repeat prescription: Secondary | ICD-10-CM | POA: Diagnosis not present

## 2018-05-24 DIAGNOSIS — Z79890 Hormone replacement therapy (postmenopausal): Secondary | ICD-10-CM | POA: Diagnosis not present

## 2018-05-24 MED ORDER — LEVOTHYROXINE SODIUM 75 MCG PO TABS: 75.0000 ug | ORAL_TABLET | Freq: Every day | ORAL | 1 refills | Status: AC

## 2018-05-24 MED ORDER — ASPIRIN 81 MG PO TABS: 81.0000 mg | ORAL_TABLET | Freq: Two times a day (BID) | ORAL | 2 refills | Status: DC

## 2018-05-24 MED ORDER — CHOLECALCIFEROL 25 MCG (1000 UT) PO TABS: 1000.0000 [IU] | ORAL_TABLET | Freq: Every day | ORAL | 11 refills | Status: AC

## 2018-05-24 MED ORDER — CALCIUM CARBONATE 1250 (500 CA) MG PO TABS: 1.0000 | ORAL_TABLET | Freq: Every day | ORAL | 11 refills | Status: AC

## 2018-05-24 MED ORDER — LEVOCETIRIZINE DIHYDROCHLORIDE 5 MG PO TABS: 5.0000 mg | ORAL_TABLET | Freq: Every evening | ORAL | 2 refills | Status: AC

## 2018-05-24 MED ORDER — FLUTICASONE PROPIONATE 50 MCG/ACT NA SUSP: 1.0000 | Freq: Every day | NASAL | 2 refills | Status: AC

## 2018-05-24 MED ORDER — FERROUS SULFATE 325 (65 FE) MG PO TABS: 325.0000 mg | ORAL_TABLET | Freq: Every day | ORAL | 2 refills | Status: AC

## 2018-05-24 MED ORDER — DICLOFENAC SODIUM 1 % TD GEL: 2.0000 g | Freq: Four times a day (QID) | TRANSDERMAL | 0 refills | Status: AC | PRN

## 2018-05-24 MED ORDER — LOSARTAN POTASSIUM 100 MG PO TABS: 100.0000 mg | ORAL_TABLET | Freq: Every day | ORAL | 3 refills | Status: AC

## 2018-05-24 MED ORDER — ASPIRIN 81 MG PO TABS: 81.0000 mg | ORAL_TABLET | Freq: Every day | ORAL | 2 refills | Status: AC

## 2018-05-24 MED ORDER — ATORVASTATIN CALCIUM 80 MG PO TABS: ORAL_TABLET | ORAL | 2 refills | Status: AC

## 2018-05-24 MED ORDER — MONTELUKAST SODIUM 10 MG PO TABS: 10.0000 mg | ORAL_TABLET | Freq: Every day | ORAL | 2 refills | Status: DC

## 2018-05-24 MED ORDER — ALENDRONATE SODIUM 70 MG PO TABS: 70.0000 mg | ORAL_TABLET | ORAL | 2 refills | Status: AC

## 2018-05-24 MED ORDER — DULOXETINE HCL 30 MG PO CPEP: 30.0000 mg | ORAL_CAPSULE | Freq: Every day | ORAL | 2 refills | Status: AC

## 2018-05-24 MED ORDER — TRAMADOL HCL 50 MG PO TABS: 50.0000 mg | ORAL_TABLET | Freq: Every day | ORAL | 0 refills | Status: AC | PRN

## 2018-05-24 NOTE — Progress Notes (Signed)
   CC: Medication refills  HPI:  [name] is a 84 y.o. female with a past medical history listed below here today accompanied by her daughter requesting refills on all of her medications.  She reports that at the end of July they will be going on a 52-month trip to [place] and want to ensure that all of her medications are in order prior to her trip.  On review of her medication list it does appear that she is on aspirin 325 mg twice daily.  I cannot find a clear indication as to why she is on this dosing.  Additionally, she was seen in clinic in February and was told to restart her losartan.  There appears to been some confusion and her daughter reports that they are not giving her losartan.  Her blood pressure does remain mildly elevated today in the 150 systolic.  It also appears that her Synthroid dose was adjusted at her February visit and we can recheck on that today.  Past Medical History:  Diagnosis Date  . High cholesterol   . Hypertension   . Hypothyroidism   . Pneumonia ~ 1943  . Refusal of blood transfusions as patient is Jehovah's Witness   . Rheumatoid arthritis (HCC)    "all over; hands, feet, knees, joints" (01/08/2018)  . Thyroid disease    Review of Systems:   No chest pain or shortness of breath  Physical Exam:  Vitals:   05/24/18 1036  BP: (!) 156/98  Pulse: 90  Temp: 98 F (36.7 C)  TempSrc: Oral  SpO2: 95%  Height: 5\' 2"  (1.575 m)   GENERAL- alert, co-operative, appears as stated age, not in any distress. HEENT- Atraumatic, normocephalic CARDIAC- RRR, no murmurs, rubs or gallops. RESP- Moving equal volumes of air, and clear to auscultation bilaterally, no wheezes or crackles. SKIN- Warm, dry, No rash or lesion. PSYCH- Normal mood and affect, appropriate thought content and speech.   Assessment & Plan:   I will give her refills on her chronic medications today.  See Encounters Tab for problem based charting.  Patient discussed with Dr.  

## 2018-05-24 NOTE — Patient Instructions (Addendum)
Ms. Whitney Mayer, Whitney Mayer re-start your losartan 100 mg daily. Please come back and see [place] before your trip. I will send in refills for you today. Also schedule a follow up appointment with Dr. Obie Dredge for when you return from your trip.

## 2018-05-24 NOTE — Assessment & Plan Note (Signed)
We will recheck her thyroid function today on the higher dose of Synthroid.  Continue Synthroid 75 MCG daily pending results.

## 2018-05-24 NOTE — Assessment & Plan Note (Signed)
BP Readings from Last 3 Encounters:  05/24/18 (!) 156/98  02/22/18 (!) 143/92  01/23/18 137/85    Lab Results  Component Value Date   NA 136 01/08/2018   K 3.6 01/08/2018   CREATININE 0.63 01/08/2018   Pressure is elevated at 156/98 today.  After discussion with patient and daughter appears that she never restarted the losartan as instructed at her February visit.  She is currently not taking any antihypertensive medications.  Plan: We will restart her losartan today and they are agreeable to follow-up next month before they leave for their trip.  We will recheck on blood pressure and recheck labs at that time.

## 2018-05-25 LAB — TSH: TSH: 2.59 u[IU]/mL (ref 0.450–4.500)

## 2018-05-27 ENCOUNTER — Encounter: Payer: Self-pay | Admitting: Internal Medicine

## 2018-05-27 NOTE — Progress Notes (Signed)
Internal Medicine Clinic Attending  Case discussed with Dr. Boswell at the time of the visit.  We reviewed the resident's history and exam and pertinent patient test results.  I agree with the assessment, diagnosis, and plan of care documented in the resident's note.  

## 2018-05-28 ENCOUNTER — Other Ambulatory Visit: Payer: Self-pay | Admitting: *Deleted

## 2018-05-28 NOTE — Telephone Encounter (Signed)
Tramadol is for chronic pain per Dr. Mikey Bussing (Attending). This information has been faxed to [facility] today. Kinnie Feil, RN, BSN

## 2018-05-28 NOTE — Telephone Encounter (Signed)
Last refill rx had qty # 9 ; does it suppose to be #90? Also  [facility] wants to know pain dx for Tramadol and is this a chronic condition? Thanks

## 2018-05-29 MED ORDER — MONTELUKAST SODIUM 10 MG PO TABS: 10.0000 mg | ORAL_TABLET | Freq: Every day | ORAL | 11 refills | Status: AC

## 2018-06-17 ENCOUNTER — Encounter: Payer: Self-pay | Admitting: Internal Medicine

## 2018-06-17 ENCOUNTER — Other Ambulatory Visit: Payer: Self-pay

## 2018-06-17 ENCOUNTER — Ambulatory Visit (INDEPENDENT_AMBULATORY_CARE_PROVIDER_SITE_OTHER): Payer: Medicare HMO | Admitting: Internal Medicine

## 2018-06-17 VITALS — BP 132/84 | HR 72 | Temp 97.6°F

## 2018-06-17 DIAGNOSIS — I10 Essential (primary) hypertension: Secondary | ICD-10-CM | POA: Diagnosis not present

## 2018-06-17 DIAGNOSIS — M069 Rheumatoid arthritis, unspecified: Secondary | ICD-10-CM | POA: Diagnosis not present

## 2018-06-17 DIAGNOSIS — E063 Autoimmune thyroiditis: Secondary | ICD-10-CM | POA: Diagnosis not present

## 2018-06-17 DIAGNOSIS — Z79899 Other long term (current) drug therapy: Secondary | ICD-10-CM | POA: Diagnosis not present

## 2018-06-17 DIAGNOSIS — Z79890 Hormone replacement therapy (postmenopausal): Secondary | ICD-10-CM

## 2018-06-17 NOTE — Assessment & Plan Note (Signed)
BP Readings from Last 3 Encounters:  06/17/18 132/84  05/24/18 (!) 156/98  02/22/18 (!) 143/92   - Patient's blood pressure closer to goal today but still elevated - Patient started on losartan 100mg  daily last visit - Baseline creatinine 0.63 - Denies any episodes of hypotension  - Will check BMP today - C/w current regimen - Consider adding amlodipine in the future if continuing to have high bp

## 2018-06-17 NOTE — Assessment & Plan Note (Signed)
Lab Results  Component Value Date   TSH 2.590 05/24/2018   - Pt currently on of Synthroid - Latest TSH within normal limits - Patient's medicine bag has Synthroid pills which were prescribed for her daughter - her daughter states she forgot to bring the patient's pills and they got mixed up  - Unclear if patient is taking the new dose or dose - Strongly counseled patient and her daughter on importance of taking the correct prescribed dosage - C/w Synthroid daily

## 2018-06-17 NOTE — Patient Instructions (Signed)
Mrs.Alfrey  Please have a safe trip to [place] and we will see you when you get back.  -Judeth Cornfield

## 2018-06-17 NOTE — Progress Notes (Signed)
   CC: Hypertension and Hypothyroidism management  HPI: [name] is a 84 y.o. F w/ PMH of HTN, Rheumatoid arthritis, Hypothyroidism presenting to the clinic for blood pressure and lab check with her daughter. She was prescribed losartan 100mg  and her synthroid dosage was increased to last visit. She states she has been taking all of her medications as prescribed without any significant side effects that she could appreciate. Patient is about to go on an extended trip to and wants to make sure all her medications are at right doses. She denies any light-headedness, dizziness, palpitations, shortness of breath. She denies any heat intolerance, chest pain, hair loss.  Past Medical History:  Diagnosis Date  . High cholesterol   . Hypertension   . Hypothyroidism   . Pneumonia ~ 1943  . Refusal of blood transfusions as patient is [employer]hov[employer]'s [employer]   . Rheumatoid arthritis (HCC)    "all over; hands, feet, knees, joints" (01/08/2018)  . Thyroid disease    Review of Systems: Review of Systems  Constitutional: Negative for chills, fever and malaise/fatigue.  Respiratory: Negative for cough, hemoptysis, shortness of breath and wheezing.   Cardiovascular: Negative for chest pain, palpitations, orthopnea and leg swelling.  Neurological: Negative for dizziness, tingling, sensory change, weakness and headaches.     Physical Exam: Vitals:   06/17/18 1316 06/17/18 1336  BP: (!) 141/102 132/84  Pulse: 72   Temp: 97.6 F (36.4 C)   SpO2: 96%    Physical Exam  Constitutional: She appears well-developed and well-nourished. No distress.  Sitting comfortably in wheelchair  Neck: Normal range of motion. Neck supple. No JVD present. No thyromegaly present.  Cardiovascular: Normal rate, regular rhythm, normal heart sounds and intact distal pulses.  Respiratory: Effort normal and breath sounds normal. No respiratory distress. She has no wheezes.  Musculoskeletal: Normal range  of motion. She exhibits deformity (Swan-neck deformity and ulnar deviation of fingers on bilateral hands). She exhibits no edema.     Assessment & Plan:   See Encounters Tab for problem based charting.  Patient seen with Dr. 06/19/18   -Sandre Kitty, PGY1

## 2018-06-18 LAB — BMP8+ANION GAP
Anion Gap: 14 mmol/L (ref 10.0–18.0)
BUN/Creatinine Ratio: 27 (ref 12–28)
BUN: 15 mg/dL (ref 8–27)
CO2: 23 mmol/L (ref 20–29)
Calcium: 8.5 mg/dL — ABNORMAL LOW (ref 8.7–10.3)
Chloride: 103 mmol/L (ref 96–106)
Creatinine, Ser: 0.55 mg/dL — ABNORMAL LOW (ref 0.57–1.00)
GFR calc Af Amer: 100 mL/min/{1.73_m2} (ref >59)
GFR calc non Af Amer: 87 mL/min/{1.73_m2} (ref >59)
Glucose: 85 mg/dL (ref 65–99)
Potassium: 3.9 mmol/L (ref 3.5–5.2)
Sodium: 140 mmol/L (ref 134–144)

## 2018-06-18 NOTE — Progress Notes (Signed)
Internal Medicine Clinic Attending  I saw and evaluated the patient.  I personally confirmed the key portions of the history and exam documented by Dr. Lee and I reviewed pertinent patient test results.  The assessment, diagnosis, and plan were formulated together and I agree with the documentation in the resident's note.  Alexander Raines, M.D., Ph.D.  

## 2018-06-23 DIAGNOSIS — M069 Rheumatoid arthritis, unspecified: Secondary | ICD-10-CM | POA: Diagnosis not present

## 2018-06-23 DIAGNOSIS — M6281 Muscle weakness (generalized): Secondary | ICD-10-CM | POA: Diagnosis not present

## 2018-06-27 ENCOUNTER — Telehealth: Payer: Self-pay

## 2018-06-27 NOTE — Telephone Encounter (Signed)
Requesting lab results. Please call back.  

## 2018-06-27 NOTE — Telephone Encounter (Signed)
Spoke with patient's daughter, Jovita Kussmaul Rebot, and informed her that her creatinine is not rising and she can continue to take her blood pressure medication as prescribed.

## 2018-06-27 NOTE — Telephone Encounter (Signed)
Spoke with patient's daughter and let her know of the lab results. Thank you

## 2018-10-24 IMAGING — CT CT CHEST HIGH RESOLUTION W/O CM
2 of 6 series · 14 of 36 positions shown, 17 images · non-contrast
Comparison: 11/10/2016 high-resolution chest CT.

CLINICAL DATA: Follow-up bronchiectasis.  Intermittent dyspnea.

EXAM:
CT CHEST WITHOUT CONTRAST
TECHNIQUE: Multidetector CT imaging of the chest was performed following the
standard protocol without intravenous contrast. High resolution
imaging of the lungs, as well as inspiratory and expiratory imaging,
was performed.

[Series 2: high resolution · axial · 0.60mm/px · z∈[+1243,+1473]mm · 11 of 129 slices shown, 14 images]
[im 7/129  mediastinal]
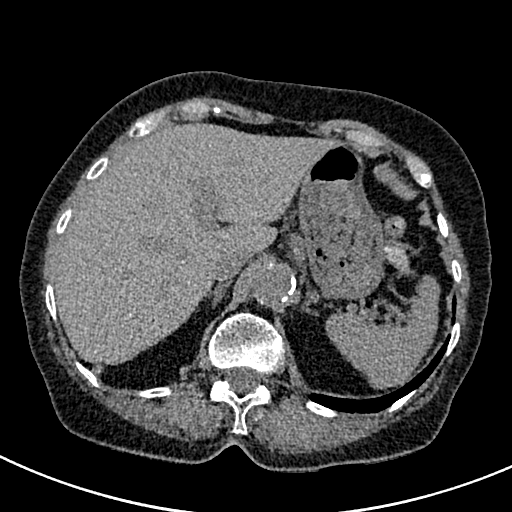
[im 7/129  lung]
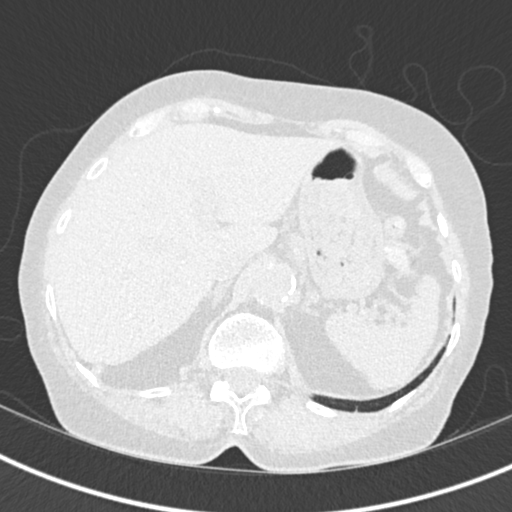
[im 21/129  lung]
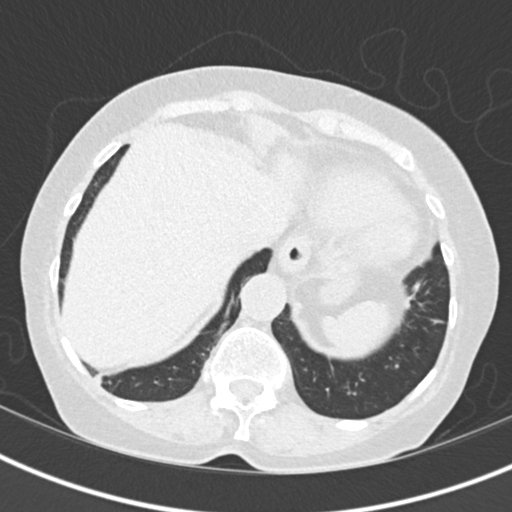
[im 34/129  lung]
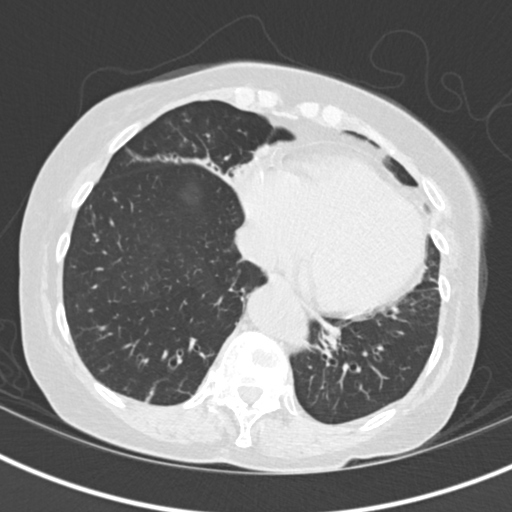
[im 41/129  lung]
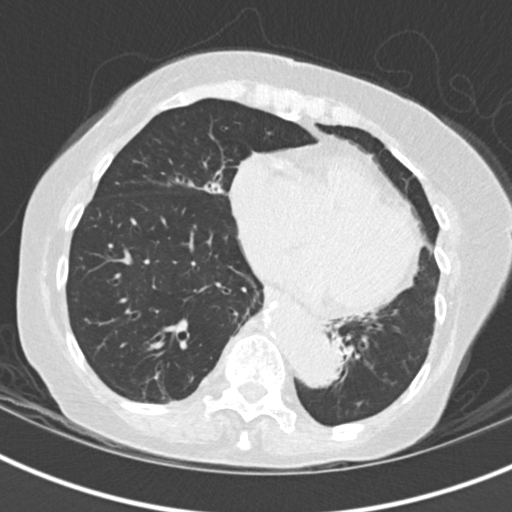
[im 54/129  mediastinal]
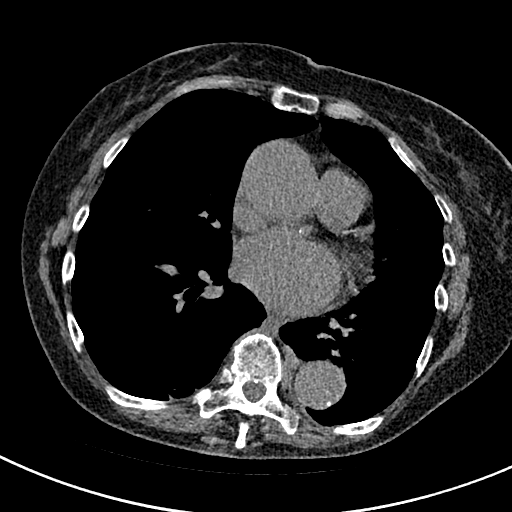
[im 54/129  lung]
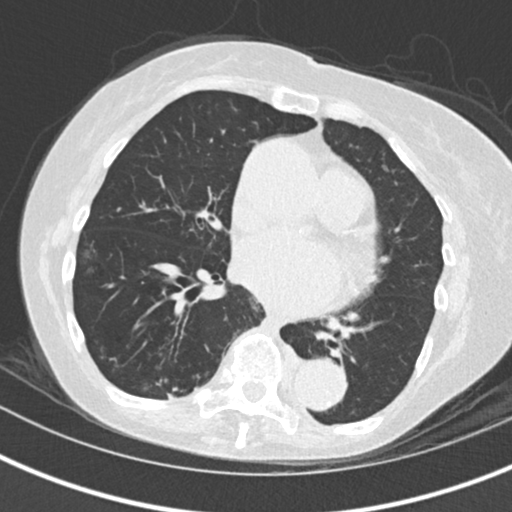
[im 68/129  lung]
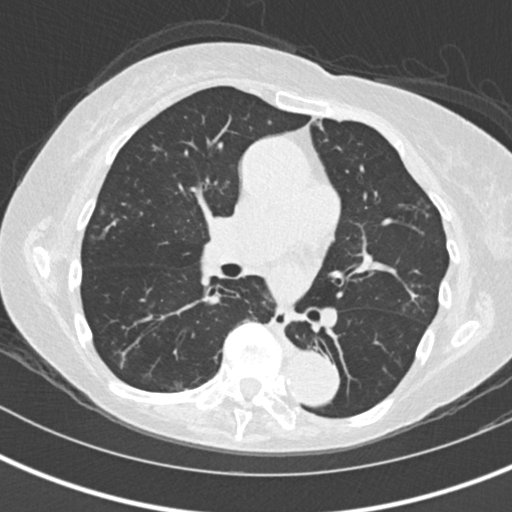
[im 75/129  lung]
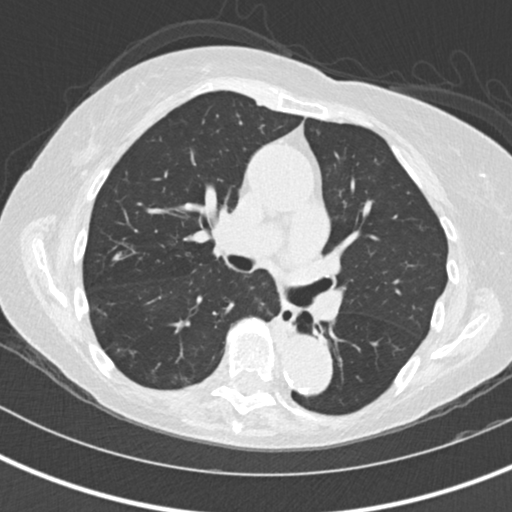
[im 88/129  lung]
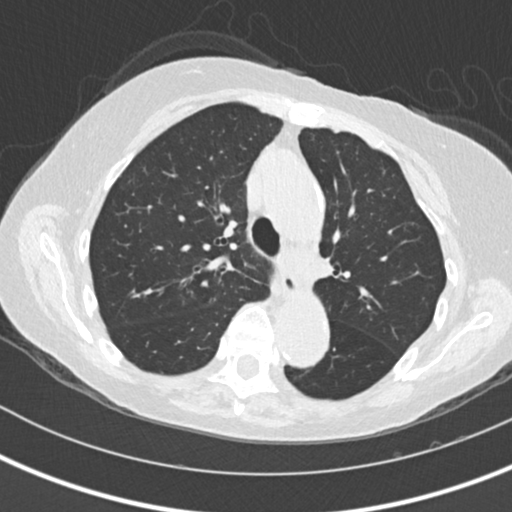
[im 95/129  mediastinal]
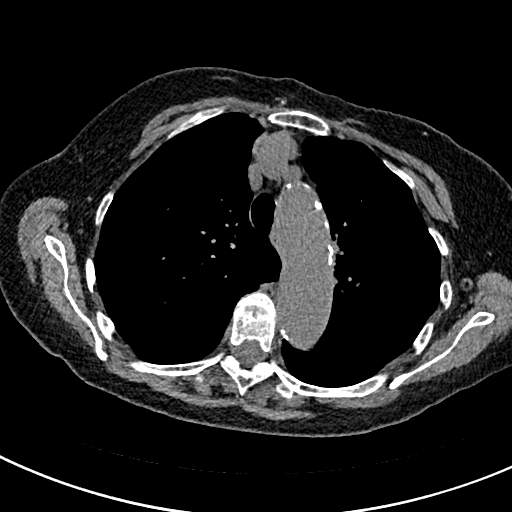
[im 95/129  lung]
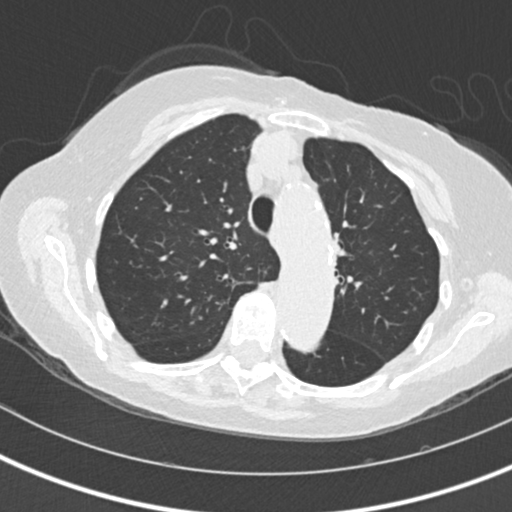
[im 108/129  lung]
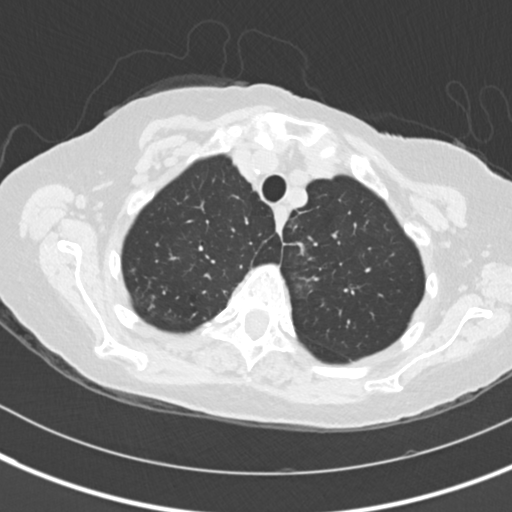
[im 122/129  lung]
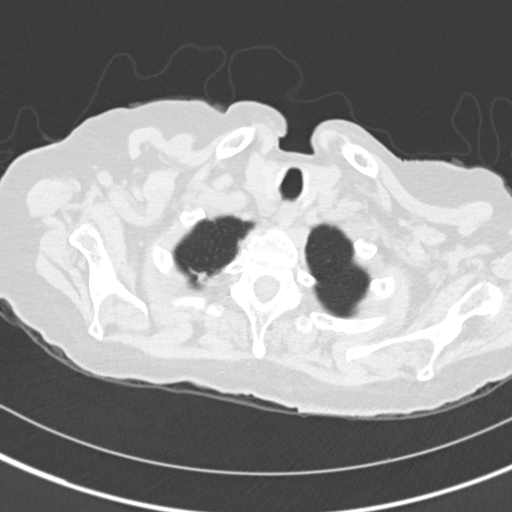

[Series 5: coronal · coronal · 0.54mm/px · 3 of 121 slices shown]
[im 25/121  lung]
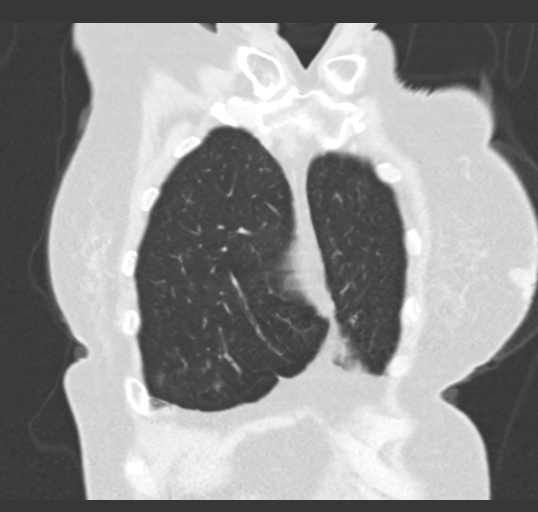
[im 49/121  lung]
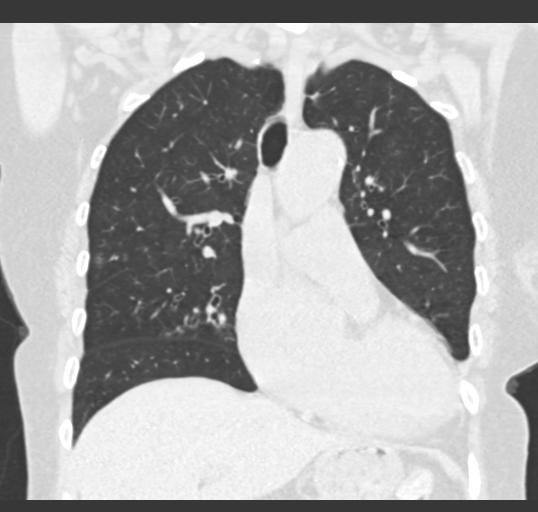
[im 73/121  lung]
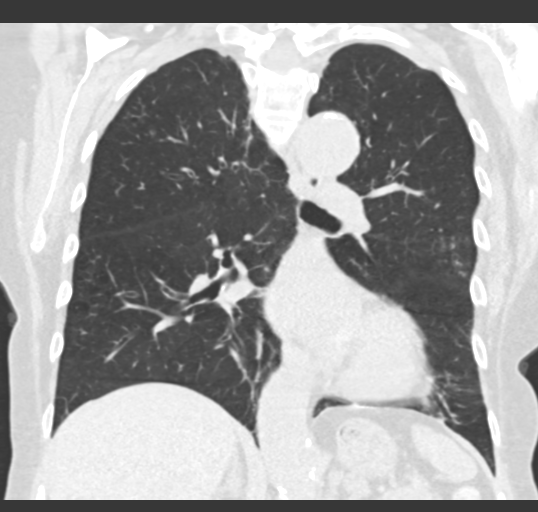

[14 of 36 positions shown; findings below may reference images not displayed]

FINDINGS: Cardiovascular: Normal heart size. No significant pericardial
fluid/thickening. Left anterior descending, left circumflex and
right coronary atherosclerosis. Thoracic aortic atherosclerosis with
stable 4.2 cm ascending thoracic aortic aneurysm. Normal caliber
pulmonary arteries.

Mediastinum/Nodes: No discrete thyroid nodules. Unremarkable
esophagus. No pathologically enlarged axillary, mediastinal or gross
hilar lymph nodes, noting limited sensitivity for the detection of
hilar adenopathy on this noncontrast study.

Lungs/Pleura: No pneumothorax. No pleural effusion. No acute
consolidative airspace disease or lung masses. There is
mild-to-moderate cylindrical and varicoid bronchiectasis extensively
distributed throughout both lungs involving all lung lobes, most
prominent in the right middle lobe and lower lobes, not appreciably
changed. There are associated moderate patchy tree-in-bud opacities
throughout both lungs at the areas of bronchiectasis, overall not
significantly changed, with mild improvement in the right lower lobe
and mild worsening in the peripheral left upper lobe. Subpleural
bandlike opacities with associated volume loss and distortion in the
basilar right middle lobe and inferior segment lingula and
additional scattered small parenchymal bands in the bilateral lower
lobes are stable and compatible with postinfectious scarring. No
significant air trapping on the expiration sequence.

Upper abdomen: Subcentimeter hypodense superior right liver lobe
lesion is too small to characterize and stable back to 05/31/2016,
suggesting a benign lesion.

Musculoskeletal: No aggressive appearing focal osseous lesions.
Moderate thoracic spondylosis. Stable chronic mild compression
deformity in the superior T11 vertebral body.
IMPRESSION: 1. Persistent findings of chronic infectious bronchiolitis due to
atypical mycobacterial infection (BAZARA) with extensively distributed
cylindrical and varicoid bronchiectasis throughout both lungs and
areas of tree-in-bud opacity and postinfectious scarring. Overall no
significant change, with areas of mild improvement in the
tree-in-bud opacities in the right lower lobe and mild worsening in
the left upper lobe.
2. Aortic atherosclerosis. Stable ectatic 4.2 cm ascending thoracic
aorta. Recommend annual imaging followup by CTA or MRA. This
recommendation follows 9838
ACCF/AHA/AATS/ACR/ASA/SCA/DAIDA/ABISAI/KEIZO/BUSTILLO Guidelines for the
Diagnosis and Management of Patients with Thoracic Aortic Disease.
Circulation. 9838; 121: [id number].
3. Three-vessel coronary atherosclerosis.

## 2019-04-10 NOTE — Telephone Encounter (Signed)
Done

## 2019-05-22 ENCOUNTER — Encounter: Payer: Self-pay | Admitting: *Deleted
